# Patient Record
Sex: Female | Born: 1935
Health system: Southern US, Community
[De-identification: ages and names within clinical notes are randomized; demographics above are authoritative.]

## PROBLEM LIST (undated history)

## (undated) DIAGNOSIS — J309 Allergic rhinitis, unspecified: Secondary | ICD-10-CM

## (undated) DIAGNOSIS — G4700 Insomnia, unspecified: Secondary | ICD-10-CM

## (undated) DIAGNOSIS — I447 Left bundle-branch block, unspecified: Secondary | ICD-10-CM

## (undated) DIAGNOSIS — E785 Hyperlipidemia, unspecified: Secondary | ICD-10-CM

## (undated) DIAGNOSIS — M8580 Other specified disorders of bone density and structure, unspecified site: Secondary | ICD-10-CM

## (undated) HISTORY — DX: Other specified disorders of bone density and structure, unspecified site: M85.80

## (undated) HISTORY — DX: Left bundle-branch block, unspecified: I44.7

## (undated) HISTORY — DX: Insomnia, unspecified: G47.00

## (undated) HISTORY — DX: Hyperlipidemia, unspecified: E78.5

## (undated) HISTORY — DX: Allergic rhinitis, unspecified: J30.9

---

## 2000-10-30 ENCOUNTER — Other Ambulatory Visit: Admission: RE | Admit: 2000-10-30 | Discharge: 2000-10-30 | Payer: Self-pay | Admitting: Gynecology

## 2001-06-11 ENCOUNTER — Other Ambulatory Visit: Admission: RE | Admit: 2001-06-11 | Discharge: 2001-06-11 | Payer: Self-pay | Admitting: Gynecology

## 2001-06-12 ENCOUNTER — Ambulatory Visit (HOSPITAL_COMMUNITY): Admission: RE | Admit: 2001-06-12 | Discharge: 2001-06-12 | Payer: Self-pay | Admitting: Family Medicine

## 2001-06-12 ENCOUNTER — Encounter: Payer: Self-pay | Admitting: Family Medicine

## 2002-06-30 ENCOUNTER — Other Ambulatory Visit: Admission: RE | Admit: 2002-06-30 | Discharge: 2002-06-30 | Payer: Self-pay | Admitting: Obstetrics and Gynecology

## 2003-09-21 ENCOUNTER — Other Ambulatory Visit: Admission: RE | Admit: 2003-09-21 | Discharge: 2003-09-21 | Payer: Self-pay | Admitting: Obstetrics and Gynecology

## 2003-09-24 ENCOUNTER — Ambulatory Visit (HOSPITAL_COMMUNITY): Admission: RE | Admit: 2003-09-24 | Discharge: 2003-09-24 | Payer: Self-pay | Admitting: Internal Medicine

## 2004-04-20 ENCOUNTER — Ambulatory Visit (HOSPITAL_COMMUNITY): Admission: RE | Admit: 2004-04-20 | Discharge: 2004-04-20 | Payer: Self-pay | Admitting: Obstetrics and Gynecology

## 2004-10-17 ENCOUNTER — Other Ambulatory Visit: Admission: RE | Admit: 2004-10-17 | Discharge: 2004-10-17 | Payer: Self-pay | Admitting: Obstetrics and Gynecology

## 2004-12-08 ENCOUNTER — Other Ambulatory Visit: Admission: RE | Admit: 2004-12-08 | Discharge: 2004-12-08 | Payer: Self-pay | Admitting: Obstetrics and Gynecology

## 2011-08-17 DIAGNOSIS — Z124 Encounter for screening for malignant neoplasm of cervix: Secondary | ICD-10-CM | POA: Diagnosis not present

## 2011-08-17 DIAGNOSIS — N951 Menopausal and female climacteric states: Secondary | ICD-10-CM | POA: Diagnosis not present

## 2011-08-17 DIAGNOSIS — Z1231 Encounter for screening mammogram for malignant neoplasm of breast: Secondary | ICD-10-CM | POA: Diagnosis not present

## 2011-08-17 DIAGNOSIS — M810 Age-related osteoporosis without current pathological fracture: Secondary | ICD-10-CM | POA: Diagnosis not present

## 2011-08-17 DIAGNOSIS — Z130 Encounter for screening for diseases of the blood and blood-forming organs and certain disorders involving the immune mechanism: Secondary | ICD-10-CM | POA: Diagnosis not present

## 2011-10-27 DIAGNOSIS — Z79899 Other long term (current) drug therapy: Secondary | ICD-10-CM | POA: Diagnosis not present

## 2011-10-27 DIAGNOSIS — E782 Mixed hyperlipidemia: Secondary | ICD-10-CM | POA: Diagnosis not present

## 2011-10-31 DIAGNOSIS — E785 Hyperlipidemia, unspecified: Secondary | ICD-10-CM | POA: Diagnosis not present

## 2012-01-01 DIAGNOSIS — J209 Acute bronchitis, unspecified: Secondary | ICD-10-CM | POA: Diagnosis not present

## 2012-01-18 DIAGNOSIS — J42 Unspecified chronic bronchitis: Secondary | ICD-10-CM | POA: Diagnosis not present

## 2012-05-10 DIAGNOSIS — E785 Hyperlipidemia, unspecified: Secondary | ICD-10-CM | POA: Diagnosis not present

## 2012-05-10 DIAGNOSIS — Z79899 Other long term (current) drug therapy: Secondary | ICD-10-CM | POA: Diagnosis not present

## 2012-05-16 DIAGNOSIS — Z23 Encounter for immunization: Secondary | ICD-10-CM | POA: Diagnosis not present

## 2012-05-16 DIAGNOSIS — E785 Hyperlipidemia, unspecified: Secondary | ICD-10-CM | POA: Diagnosis not present

## 2012-05-27 DIAGNOSIS — Z961 Presence of intraocular lens: Secondary | ICD-10-CM | POA: Diagnosis not present

## 2012-07-05 DIAGNOSIS — J019 Acute sinusitis, unspecified: Secondary | ICD-10-CM | POA: Diagnosis not present

## 2012-07-05 DIAGNOSIS — J069 Acute upper respiratory infection, unspecified: Secondary | ICD-10-CM | POA: Diagnosis not present

## 2012-10-04 DIAGNOSIS — Z1231 Encounter for screening mammogram for malignant neoplasm of breast: Secondary | ICD-10-CM | POA: Diagnosis not present

## 2012-10-04 DIAGNOSIS — Z130 Encounter for screening for diseases of the blood and blood-forming organs and certain disorders involving the immune mechanism: Secondary | ICD-10-CM | POA: Diagnosis not present

## 2012-10-04 DIAGNOSIS — Z01419 Encounter for gynecological examination (general) (routine) without abnormal findings: Secondary | ICD-10-CM | POA: Diagnosis not present

## 2012-10-15 ENCOUNTER — Other Ambulatory Visit: Payer: Self-pay | Admitting: *Deleted

## 2012-10-15 MED ORDER — SIMVASTATIN 20 MG PO TABS: 20.0000 mg | ORAL_TABLET | Freq: Every evening | ORAL | Status: DC

## 2012-11-07 ENCOUNTER — Telehealth: Payer: Self-pay | Admitting: Family Medicine

## 2012-11-07 DIAGNOSIS — E785 Hyperlipidemia, unspecified: Secondary | ICD-10-CM

## 2012-11-07 DIAGNOSIS — Z79899 Other long term (current) drug therapy: Secondary | ICD-10-CM

## 2012-11-07 NOTE — Telephone Encounter (Signed)
Pt needs blood work for appt on 4/28 thanks

## 2012-11-07 NOTE — Telephone Encounter (Signed)
Blood work papers printed and left up front for patient pick up. 

## 2012-11-07 NOTE — Telephone Encounter (Signed)
Lip liv 

## 2012-11-08 ENCOUNTER — Encounter: Payer: Self-pay | Admitting: *Deleted

## 2012-11-12 DIAGNOSIS — Z79899 Other long term (current) drug therapy: Secondary | ICD-10-CM | POA: Diagnosis not present

## 2012-11-12 DIAGNOSIS — E7800 Pure hypercholesterolemia, unspecified: Secondary | ICD-10-CM | POA: Diagnosis not present

## 2012-11-12 LAB — HEPATIC FUNCTION PANEL
ALT: 11 U/L (ref 0–35)
AST: 17 U/L (ref 0–37)
Albumin: 4.2 g/dL (ref 3.5–5.2)
Alkaline Phosphatase: 48 U/L (ref 39–117)
Bilirubin, Direct: 0.1 mg/dL (ref 0.0–0.3)
Indirect Bilirubin: 0.3 mg/dL (ref 0.0–0.9)
Total Bilirubin: 0.4 mg/dL (ref 0.3–1.2)
Total Protein: 6.9 g/dL (ref 6.0–8.3)

## 2012-11-12 LAB — LIPID PANEL
Cholesterol: 178 mg/dL (ref 0–200)
HDL: 65 mg/dL (ref >39)
LDL Cholesterol: 88 mg/dL (ref 0–99)
Total CHOL/HDL Ratio: 2.7 Ratio
Triglycerides: 125 mg/dL (ref <150)
VLDL: 25 mg/dL (ref 0–40)

## 2012-11-18 ENCOUNTER — Ambulatory Visit: Payer: Self-pay | Admitting: Family Medicine

## 2012-11-20 ENCOUNTER — Ambulatory Visit (INDEPENDENT_AMBULATORY_CARE_PROVIDER_SITE_OTHER): Payer: Medicare Other | Admitting: Family Medicine

## 2012-11-20 ENCOUNTER — Encounter: Payer: Self-pay | Admitting: Family Medicine

## 2012-11-20 VITALS — BP 132/77 | HR 80 | Wt 161.0 lb

## 2012-11-20 DIAGNOSIS — E785 Hyperlipidemia, unspecified: Secondary | ICD-10-CM | POA: Diagnosis not present

## 2012-11-20 MED ORDER — SIMVASTATIN 20 MG PO TABS: ORAL_TABLET | ORAL | Status: DC

## 2012-11-20 NOTE — Progress Notes (Signed)
  Subjective:    Patient ID: Marisa Livingston, female    DOB: 1936/01/31, 77 y.o.   MRN: [id number]  HPI  Not the best with diet or exercise. Family stress, not as much as she hopes. t-mill twice per week.  on zyrtec for allergies Review of Systems no chest pain no shortness of breath no major change in weight no abdominal pain. Review systems otherwise negative    Objective:   Physical Exam Alert no acute distress. HEENT normal. Vitals review. Lungs clear. Heart regular in rhythm.   Results for orders placed in visit on 11/07/12  LIPID PANEL      Result Value Range   Cholesterol 178  0 - 200 mg/dL   Triglycerides 782  <956 mg/dL   HDL 65  >21 mg/dL   Total CHOL/HDL Ratio 2.7     VLDL 25  0 - 40 mg/dL   LDL Cholesterol 88  0 - 99 mg/dL  HEPATIC FUNCTION PANEL      Result Value Range   Total Bilirubin 0.4  0.3 - 1.2 mg/dL   Bilirubin, Direct 0.1  0.0 - 0.3 mg/dL   Indirect Bilirubin 0.3  0.0 - 0.9 mg/dL   Alkaline Phosphatase 48  39 - 117 U/L   AST 17  0 - 37 U/L   ALT 11  0 - 35 U/L   Total Protein 6.9  6.0 - 8.3 g/dL   Albumin 4.2  3.5 - 5.2 g/dL       Assessment & Plan:  Impression hyperlipidemia good control. Plan maintain same medicine. Diet exercise discussed in encourage. Check every 6 months. WSL

## 2012-11-20 NOTE — Progress Notes (Signed)
RX called in on voicemail 

## 2013-05-05 ENCOUNTER — Telehealth: Payer: Self-pay | Admitting: Family Medicine

## 2013-05-05 DIAGNOSIS — E782 Mixed hyperlipidemia: Secondary | ICD-10-CM

## 2013-05-05 DIAGNOSIS — Z79899 Other long term (current) drug therapy: Secondary | ICD-10-CM

## 2013-05-05 NOTE — Telephone Encounter (Signed)
Patient needs Bw paperwork

## 2013-05-05 NOTE — Telephone Encounter (Signed)
Blood work ordered in system. Patient notified to report to [facility] to have blood drawn.

## 2013-05-05 NOTE — Telephone Encounter (Signed)
Lip liv m7 

## 2013-05-14 ENCOUNTER — Ambulatory Visit (INDEPENDENT_AMBULATORY_CARE_PROVIDER_SITE_OTHER): Payer: Medicare Other

## 2013-05-14 DIAGNOSIS — Z23 Encounter for immunization: Secondary | ICD-10-CM | POA: Diagnosis not present

## 2013-05-20 DIAGNOSIS — E782 Mixed hyperlipidemia: Secondary | ICD-10-CM | POA: Diagnosis not present

## 2013-05-20 DIAGNOSIS — Z79899 Other long term (current) drug therapy: Secondary | ICD-10-CM | POA: Diagnosis not present

## 2013-05-20 LAB — HEPATIC FUNCTION PANEL
ALT: 11 U/L (ref 0–35)
AST: 17 U/L (ref 0–37)
Albumin: 4.2 g/dL (ref 3.5–5.2)
Alkaline Phosphatase: 52 U/L (ref 39–117)
Bilirubin, Direct: 0.1 mg/dL (ref 0.0–0.3)
Indirect Bilirubin: 0.2 mg/dL (ref 0.0–0.9)
Total Bilirubin: 0.3 mg/dL (ref 0.3–1.2)
Total Protein: 7 g/dL (ref 6.0–8.3)

## 2013-05-20 LAB — BASIC METABOLIC PANEL
BUN: 16 mg/dL (ref 6–23)
CO2: 29 mEq/L (ref 19–32)
Calcium: 9.9 mg/dL (ref 8.4–10.5)
Chloride: 105 mEq/L (ref 96–112)
Creat: 0.89 mg/dL (ref 0.50–1.10)
Glucose, Bld: 90 mg/dL (ref 70–99)
Potassium: 5 mEq/L (ref 3.5–5.3)
Sodium: 141 mEq/L (ref 135–145)

## 2013-05-20 LAB — LIPID PANEL
Cholesterol: 158 mg/dL (ref 0–200)
HDL: 58 mg/dL (ref >39)
LDL Cholesterol: 78 mg/dL (ref 0–99)
Total CHOL/HDL Ratio: 2.7 Ratio
Triglycerides: 112 mg/dL (ref <150)
VLDL: 22 mg/dL (ref 0–40)

## 2013-05-21 ENCOUNTER — Ambulatory Visit: Payer: Medicare Other | Admitting: Family Medicine

## 2013-05-26 ENCOUNTER — Ambulatory Visit (INDEPENDENT_AMBULATORY_CARE_PROVIDER_SITE_OTHER): Payer: Medicare Other | Admitting: Family Medicine

## 2013-05-26 ENCOUNTER — Encounter: Payer: Self-pay | Admitting: Family Medicine

## 2013-05-26 VITALS — BP 140/90 | Ht 62.0 in | Wt 161.4 lb

## 2013-05-26 DIAGNOSIS — Z23 Encounter for immunization: Secondary | ICD-10-CM

## 2013-05-26 NOTE — Progress Notes (Signed)
  Subjective:    Patient ID: Marisa Livingston, female    DOB: [birth year], 77 y.o.   MRN: [id number]  HPI Patient is here today to go over BW.   Not exercising as much, trying to watch diet  Compliant ewith meds  Slight runny nose and cough lately  No concerns.  Results for orders placed in visit on 05/05/13  LIPID PANEL      Result Value Range   Cholesterol 158  0 - 200 mg/dL   Triglycerides 308  <657 mg/dL   HDL 58  >84 mg/dL   Total CHOL/HDL Ratio 2.7     VLDL 22  0 - 40 mg/dL   LDL Cholesterol 78  0 - 99 mg/dL  HEPATIC FUNCTION PANEL      Result Value Range   Total Bilirubin 0.3  0.3 - 1.2 mg/dL   Bilirubin, Direct 0.1  0.0 - 0.3 mg/dL   Indirect Bilirubin 0.2  0.0 - 0.9 mg/dL   Alkaline Phosphatase 52  39 - 117 U/L   AST 17  0 - 37 U/L   ALT 11  0 - 35 U/L   Total Protein 7.0  6.0 - 8.3 g/dL   Albumin 4.2  3.5 - 5.2 g/dL  BASIC METABOLIC PANEL      Result Value Range   Sodium 141  135 - 145 mEq/L   Potassium 5.0  3.5 - 5.3 mEq/L   Chloride 105  96 - 112 mEq/L   CO2 29  19 - 32 mEq/L   Glucose, Bld 90  70 - 99 mg/dL   BUN 16  6 - 23 mg/dL   Creat 6.96  2.95 - 2.84 mg/dL   Calcium 9.9  8.4 - 13.2 mg/dL      Review of Systems No headache no chest pain no back pain decent appetite no change in bowel habits ROS otherwise negative    Objective:   Physical Exam  Alert no apparent distress. Lungs clear. Heart regular in rhythm. HEENT normal. Ankles without edema.      Assessment & Plan:  Impression hyperlipidemia good control. Plan followup in 6 months. Maintain same medication. Pneumonia shot today. Diet exercise discussed. WSL

## 2013-08-07 DIAGNOSIS — Z961 Presence of intraocular lens: Secondary | ICD-10-CM | POA: Diagnosis not present

## 2013-12-17 DIAGNOSIS — Z124 Encounter for screening for malignant neoplasm of cervix: Secondary | ICD-10-CM | POA: Diagnosis not present

## 2013-12-17 DIAGNOSIS — Z1231 Encounter for screening mammogram for malignant neoplasm of breast: Secondary | ICD-10-CM | POA: Diagnosis not present

## 2014-01-02 ENCOUNTER — Other Ambulatory Visit: Payer: Self-pay | Admitting: Family Medicine

## 2014-01-02 ENCOUNTER — Telehealth: Payer: Self-pay | Admitting: Family Medicine

## 2014-01-02 DIAGNOSIS — Z79899 Other long term (current) drug therapy: Secondary | ICD-10-CM

## 2014-01-02 DIAGNOSIS — E782 Mixed hyperlipidemia: Secondary | ICD-10-CM

## 2014-01-02 NOTE — Telephone Encounter (Signed)
Patient needs order for blood work. °

## 2014-01-02 NOTE — Telephone Encounter (Signed)
Patient had Lipid, Liver and Met 7 -10/14

## 2014-01-05 NOTE — Telephone Encounter (Signed)
Lip liv 

## 2014-01-05 NOTE — Telephone Encounter (Signed)
Bloodwork ordered. Left message on voicemail notifying patient.

## 2014-01-12 DIAGNOSIS — E782 Mixed hyperlipidemia: Secondary | ICD-10-CM | POA: Diagnosis not present

## 2014-01-12 DIAGNOSIS — Z79899 Other long term (current) drug therapy: Secondary | ICD-10-CM | POA: Diagnosis not present

## 2014-01-12 LAB — LIPID PANEL
Cholesterol: 183 mg/dL (ref 0–200)
HDL: 52 mg/dL (ref >39)
LDL Cholesterol: 95 mg/dL (ref 0–99)
Total CHOL/HDL Ratio: 3.5 Ratio
Triglycerides: 182 mg/dL — ABNORMAL HIGH (ref <150)
VLDL: 36 mg/dL (ref 0–40)

## 2014-01-12 LAB — HEPATIC FUNCTION PANEL
ALK PHOS: 46 U/L (ref 39–117)
ALT: 11 U/L (ref 0–35)
AST: 14 U/L (ref 0–37)
Albumin: 4.2 g/dL (ref 3.5–5.2)
BILIRUBIN DIRECT: 0.1 mg/dL (ref 0.0–0.3)
BILIRUBIN TOTAL: 0.3 mg/dL (ref 0.2–1.2)
Indirect Bilirubin: 0.2 mg/dL (ref 0.2–1.2)
Total Protein: 7.1 g/dL (ref 6.0–8.3)

## 2014-01-16 ENCOUNTER — Ambulatory Visit (INDEPENDENT_AMBULATORY_CARE_PROVIDER_SITE_OTHER): Payer: Medicare Other | Admitting: Family Medicine

## 2014-01-16 ENCOUNTER — Encounter: Payer: Self-pay | Admitting: Family Medicine

## 2014-01-16 VITALS — BP 130/80 | Ht 62.0 in | Wt 163.1 lb

## 2014-01-16 DIAGNOSIS — E785 Hyperlipidemia, unspecified: Secondary | ICD-10-CM | POA: Diagnosis not present

## 2014-01-16 MED ORDER — SIMVASTATIN 20 MG PO TABS: ORAL_TABLET | ORAL | Status: DC

## 2014-01-16 NOTE — Progress Notes (Signed)
   Subjective:    Patient ID: Marisa Livingston, female    DOB: 1936/04/21, 78 y.o.   MRN: [id number]  Hyperlipidemia This is a chronic problem. The current episode started more than 1 year ago. The problem is controlled. There are no known factors aggravating her hyperlipidemia. Current antihyperlipidemic treatment includes statins. The current treatment provides significant improvement of lipids. There are no compliance problems.  There are no known risk factors for coronary artery disease.  Patient had bloodwork completed on 01/12/14 and results are in the system.   Patient states that she has no concerns at this time.    Results for orders placed in visit on 01/02/14  LIPID PANEL      Result Value Ref Range   Cholesterol 183  0 - 200 mg/dL   Triglycerides 182 (*) <150 mg/dL   HDL 52  >39 mg/dL   Total CHOL/HDL Ratio 3.5     VLDL 36  0 - 40 mg/dL   LDL Cholesterol 95  0 - 99 mg/dL  HEPATIC FUNCTION PANEL      Result Value Ref Range   Total Bilirubin 0.3  0.2 - 1.2 mg/dL   Bilirubin, Direct 0.1  0.0 - 0.3 mg/dL   Indirect Bilirubin 0.2  0.2 - 1.2 mg/dL   Alkaline Phosphatase 46  39 - 117 U/L   AST 14  0 - 37 U/L   ALT 11  0 - 35 U/L   Total Protein 7.1  6.0 - 8.3 g/dL   Albumin 4.2  3.5 - 5.2 g/dL   Walking on [place] three times per wk, now has dropped off  Diet  A bit more on the run with family stresses Review of Systems No headache no chest pain no back pain no abdominal pain no change in bowel habits    Objective:   Physical Exam Alert vital signs stable. HEENT normal. Lungs clear heart regular in rhythm.       Assessment & Plan:  Impression 1 hyperlipidemia discussed triglycerides up somewhat. Plan work harder on diet and also sugars. Exercise encourage. Maintain same meds. Recheck in 6 months. WSL

## 2014-04-28 ENCOUNTER — Ambulatory Visit (INDEPENDENT_AMBULATORY_CARE_PROVIDER_SITE_OTHER): Payer: Medicare Other | Admitting: *Deleted

## 2014-04-28 DIAGNOSIS — Z23 Encounter for immunization: Secondary | ICD-10-CM | POA: Diagnosis not present

## 2014-07-22 ENCOUNTER — Telehealth: Payer: Self-pay

## 2014-07-22 DIAGNOSIS — Z79899 Other long term (current) drug therapy: Secondary | ICD-10-CM

## 2014-07-22 DIAGNOSIS — E785 Hyperlipidemia, unspecified: Secondary | ICD-10-CM

## 2014-07-22 NOTE — Telephone Encounter (Signed)
Pt is needing labs sent over for appt. On 07/29/13.  Lipid panel and hepatic panel on 01/12/14

## 2014-07-22 NOTE — Telephone Encounter (Signed)
Notified patient's husband Kyung Rudd) bloodwork ordered and can report to the lab.

## 2014-07-22 NOTE — Telephone Encounter (Signed)
Lip liv met 7 

## 2014-07-25 DIAGNOSIS — Z79899 Other long term (current) drug therapy: Secondary | ICD-10-CM | POA: Diagnosis not present

## 2014-07-25 DIAGNOSIS — E785 Hyperlipidemia, unspecified: Secondary | ICD-10-CM | POA: Diagnosis not present

## 2014-07-26 LAB — HEPATIC FUNCTION PANEL
ALBUMIN: 3.9 g/dL (ref 3.5–5.2)
ALT: 10 U/L (ref 0–35)
AST: 16 U/L (ref 0–37)
Alkaline Phosphatase: 44 U/L (ref 39–117)
Bilirubin, Direct: 0.1 mg/dL (ref 0.0–0.3)
Indirect Bilirubin: 0.3 mg/dL (ref 0.2–1.2)
Total Bilirubin: 0.4 mg/dL (ref 0.2–1.2)
Total Protein: 7.1 g/dL (ref 6.0–8.3)

## 2014-07-26 LAB — LIPID PANEL
Cholesterol: 169 mg/dL (ref 0–200)
HDL: 54 mg/dL (ref >39)
LDL Cholesterol: 94 mg/dL (ref 0–99)
Total CHOL/HDL Ratio: 3.1 Ratio
Triglycerides: 103 mg/dL (ref <150)
VLDL: 21 mg/dL (ref 0–40)

## 2014-07-26 LAB — BASIC METABOLIC PANEL
BUN: 25 mg/dL — AB (ref 6–23)
CO2: 26 mEq/L (ref 19–32)
CREATININE: 0.84 mg/dL (ref 0.50–1.10)
Calcium: 9.4 mg/dL (ref 8.4–10.5)
Chloride: 106 mEq/L (ref 96–112)
Glucose, Bld: 91 mg/dL (ref 70–99)
Potassium: 4.8 mEq/L (ref 3.5–5.3)
Sodium: 141 mEq/L (ref 135–145)

## 2014-07-29 ENCOUNTER — Ambulatory Visit (INDEPENDENT_AMBULATORY_CARE_PROVIDER_SITE_OTHER): Payer: Medicare Other | Admitting: Family Medicine

## 2014-07-29 ENCOUNTER — Encounter: Payer: Self-pay | Admitting: Family Medicine

## 2014-07-29 VITALS — BP 122/84 | Ht 62.0 in | Wt 165.2 lb

## 2014-07-29 DIAGNOSIS — E785 Hyperlipidemia, unspecified: Secondary | ICD-10-CM

## 2014-07-29 MED ORDER — SIMVASTATIN 20 MG PO TABS: ORAL_TABLET | ORAL | Status: DC

## 2014-07-29 NOTE — Progress Notes (Signed)
   Subjective:    Patient ID: Marisa Livingston, female    DOB: [birth year], 79 y.o.   MRN: [id number]  Hyperlipidemia This is a chronic problem. The current episode started more than 1 year ago. Current antihyperlipidemic treatment includes statins. There are no compliance problems.  Risk factors for coronary artery disease include dyslipidemia.    Results for orders placed or performed in visit on 07/22/14  Lipid panel  Result Value Ref Range   Cholesterol 169 0 - 200 mg/dL   Triglycerides 103 <150 mg/dL   HDL 54 >39 mg/dL   Total CHOL/HDL Ratio 3.1 Ratio   VLDL 21 0 - 40 mg/dL   LDL Cholesterol 94 0 - 99 mg/dL  Hepatic function panel  Result Value Ref Range   Total Bilirubin 0.4 0.2 - 1.2 mg/dL   Bilirubin, Direct 0.1 0.0 - 0.3 mg/dL   Indirect Bilirubin 0.3 0.2 - 1.2 mg/dL   Alkaline Phosphatase 44 39 - 117 U/L   AST 16 0 - 37 U/L   ALT 10 0 - 35 U/L   Total Protein 7.1 6.0 - 8.3 g/dL   Albumin 3.9 3.5 - 5.2 g/dL  Basic metabolic panel  Result Value Ref Range   Sodium 141 135 - 145 mEq/L   Potassium 4.8 3.5 - 5.3 mEq/L   Chloride 106 96 - 112 mEq/L   CO2 26 19 - 32 mEq/L   Glucose, Bld 91 70 - 99 mg/dL   BUN 25 (H) 6 - 23 mg/dL   Creat 0.84 0.50 - 1.10 mg/dL   Calcium 9.4 8.4 - 10.5 mg/dL    Taking faithfully with meds,    Diet not the best at christmas  Cancer hx in one of the fam members     Review of Systems No headache no chest pain no back pain abdominal pain no change in bowel habits ROS otherwise negative    Objective:   Physical Exam Alert pleasant no acute distress. Vitals stable. HEENT normal. Lungs clear. Heart regular rate and rhythm.       Assessment & Plan:  Impression #1 hyperlipidemia good control discussed plan diet exercise discussed. Maintain same medications. Recheck every 6 months. WSL

## 2014-08-10 DIAGNOSIS — Z961 Presence of intraocular lens: Secondary | ICD-10-CM | POA: Diagnosis not present

## 2014-10-05 DIAGNOSIS — D1801 Hemangioma of skin and subcutaneous tissue: Secondary | ICD-10-CM | POA: Diagnosis not present

## 2014-10-05 DIAGNOSIS — L821 Other seborrheic keratosis: Secondary | ICD-10-CM | POA: Diagnosis not present

## 2014-10-05 DIAGNOSIS — D235 Other benign neoplasm of skin of trunk: Secondary | ICD-10-CM | POA: Diagnosis not present

## 2014-10-05 DIAGNOSIS — L570 Actinic keratosis: Secondary | ICD-10-CM | POA: Diagnosis not present

## 2014-10-05 DIAGNOSIS — L814 Other melanin hyperpigmentation: Secondary | ICD-10-CM | POA: Diagnosis not present

## 2014-12-23 ENCOUNTER — Encounter: Payer: Medicare Other | Admitting: Nurse Practitioner

## 2014-12-24 ENCOUNTER — Encounter: Payer: Self-pay | Admitting: Nurse Practitioner

## 2014-12-24 ENCOUNTER — Ambulatory Visit (INDEPENDENT_AMBULATORY_CARE_PROVIDER_SITE_OTHER): Payer: Medicare Other | Admitting: Nurse Practitioner

## 2014-12-24 VITALS — BP 132/78 | Ht 62.0 in | Wt 163.4 lb

## 2014-12-24 DIAGNOSIS — Z0000 Encounter for general adult medical examination without abnormal findings: Secondary | ICD-10-CM

## 2014-12-24 DIAGNOSIS — Z780 Asymptomatic menopausal state: Secondary | ICD-10-CM | POA: Diagnosis not present

## 2014-12-24 DIAGNOSIS — Z1231 Encounter for screening mammogram for malignant neoplasm of breast: Secondary | ICD-10-CM | POA: Diagnosis not present

## 2014-12-24 DIAGNOSIS — Z13820 Encounter for screening for osteoporosis: Secondary | ICD-10-CM | POA: Diagnosis not present

## 2014-12-24 DIAGNOSIS — Z23 Encounter for immunization: Secondary | ICD-10-CM | POA: Diagnosis not present

## 2014-12-24 MED ORDER — PNEUMOCOCCAL 13-VAL CONJ VACC IM SUSP: 0.5000 mL | Freq: Once | INTRAMUSCULAR | Status: DC

## 2014-12-24 NOTE — Patient Instructions (Signed)
1200 mg calcium per day 1000 units vitamin D per day

## 2014-12-24 NOTE — Progress Notes (Signed)
   Subjective:    Patient ID: Marisa Livingston, female    DOB: August 31, 1935, 79 y.o.   MRN: [id number]  HPI presents for her wellness exam. No vaginal bleeding or pelvic pain. Same sexual partner. Not sexually active. Regular vision and dental exams. Very active lifestyle. Overall healthy diet. No family history of breast     Review of Systems  Constitutional: Negative for activity change, appetite change and fatigue.  HENT: Negative for dental problem, ear pain, sinus pressure and sore throat.   Respiratory: Negative for cough, chest tightness, shortness of breath and wheezing.   Cardiovascular: Negative for chest pain.  Gastrointestinal: Negative for nausea, vomiting, abdominal pain, diarrhea, constipation and abdominal distention.  Genitourinary: Negative for dysuria, urgency, frequency, vaginal bleeding, vaginal discharge, enuresis, difficulty urinating, genital sores and pelvic pain.       Objective:   Physical Exam  Constitutional: She is oriented to person, place, and time. She appears well-developed. No distress.  HENT:  Right Ear: External ear normal.  Left Ear: External ear normal.  Mouth/Throat: Oropharynx is clear and moist.  Neck: Normal range of motion. Neck supple. No tracheal deviation present. No thyromegaly present.  Cardiovascular: Normal rate, regular rhythm and normal heart sounds.  Exam reveals no gallop.   No murmur heard. Pulmonary/Chest: Effort normal and breath sounds normal.  Abdominal: Soft. She exhibits no distension. There is no tenderness.  Genitourinary: Vagina normal and uterus normal. No vaginal discharge found.  External GU: pale; no rash or lesions. Limited vaginal exam due to discomfort related to menopause. No discharge. Bimanual exam: no tenderness or obvious masses. Rectal exam: no masses; no stool for hemoccult.   Musculoskeletal: She exhibits no edema.  Lymphadenopathy:    She has no cervical adenopathy.  Neurological: She is alert and  oriented to person, place, and time.  Skin: Skin is warm and dry. No rash noted.  Psychiatric: She has a normal mood and affect. Her behavior is normal.  Vitals reviewed. Breast exam: no masses; axille no adenopathy.        Assessment & Plan:  Routine general medical examination at a health care facility - Plan: MM SCREENING BREAST TOMO BILATERAL, CANCELED: MM DIGITAL SCREENING BILATERAL  Need for influenza vaccination - Plan: DISCONTINUED: pneumococcal 13-valent conjugate vaccine (PREVNAR 13) injection 0.5 mL  Need for vaccination - Plan: CANCELED: Varicella-zoster vaccine subcutaneous  Screening for osteoporosis  Visit for screening mammogram - Plan: MM SCREENING BREAST TOMO BILATERAL, CANCELED: MM DIGITAL SCREENING BILATERAL  Post-menopausal - Plan: DG Bone Density  Patient plans to schedule her colonoscopy with Dr. Laural Golden. Also given Rx for Zostavax. Recommend daily vitamin D and calcium. Return in about 1 year (around 12/24/2015) for physical. Routine follow up with Dr. Richardson Landry.

## 2014-12-29 ENCOUNTER — Ambulatory Visit (HOSPITAL_COMMUNITY)
Admission: RE | Admit: 2014-12-29 | Discharge: 2014-12-29 | Disposition: A | Discharge status: Home / Self Care | Payer: Medicare Other | Source: Ambulatory Visit | Attending: Nurse Practitioner | Admitting: Nurse Practitioner

## 2014-12-29 DIAGNOSIS — Z780 Asymptomatic menopausal state: Secondary | ICD-10-CM | POA: Diagnosis not present

## 2014-12-29 DIAGNOSIS — M898X8 Other specified disorders of bone, other site: Secondary | ICD-10-CM | POA: Diagnosis not present

## 2015-01-06 ENCOUNTER — Ambulatory Visit (HOSPITAL_COMMUNITY)
Admission: RE | Admit: 2015-01-06 | Discharge: 2015-01-06 | Disposition: A | Discharge status: Home / Self Care | Payer: Medicare Other | Source: Ambulatory Visit | Attending: Nurse Practitioner | Admitting: Nurse Practitioner

## 2015-01-06 DIAGNOSIS — Z1231 Encounter for screening mammogram for malignant neoplasm of breast: Secondary | ICD-10-CM | POA: Insufficient documentation

## 2015-03-02 ENCOUNTER — Telehealth: Payer: Self-pay | Admitting: Family Medicine

## 2015-03-02 ENCOUNTER — Other Ambulatory Visit: Payer: Self-pay | Admitting: *Deleted

## 2015-03-02 DIAGNOSIS — Z79899 Other long term (current) drug therapy: Secondary | ICD-10-CM

## 2015-03-02 DIAGNOSIS — E785 Hyperlipidemia, unspecified: Secondary | ICD-10-CM

## 2015-03-02 NOTE — Telephone Encounter (Signed)
bw orders ready. Pt notified. 

## 2015-03-02 NOTE — Telephone Encounter (Signed)
Lipid, liver

## 2015-03-02 NOTE — Telephone Encounter (Signed)
Pt would like to get her bw orders for an appt 9/2  Last labs  07/25/14  Lip, hep, bmp   Aware of [facility]

## 2015-03-19 DIAGNOSIS — E785 Hyperlipidemia, unspecified: Secondary | ICD-10-CM | POA: Diagnosis not present

## 2015-03-19 DIAGNOSIS — Z79899 Other long term (current) drug therapy: Secondary | ICD-10-CM | POA: Diagnosis not present

## 2015-03-20 LAB — HEPATIC FUNCTION PANEL
ALBUMIN: 4.3 g/dL (ref 3.5–4.8)
ALT: 10 IU/L (ref 0–32)
AST: 16 IU/L (ref 0–40)
Alkaline Phosphatase: 54 IU/L (ref 39–117)
Bilirubin Total: 0.3 mg/dL (ref 0.0–1.2)
Bilirubin, Direct: 0.11 mg/dL (ref 0.00–0.40)
Total Protein: 6.9 g/dL (ref 6.0–8.5)

## 2015-03-20 LAB — LIPID PANEL
CHOL/HDL RATIO: 3 ratio (ref 0.0–4.4)
Cholesterol, Total: 190 mg/dL (ref 100–199)
HDL: 64 mg/dL (ref >39)
LDL CALC: 94 mg/dL (ref 0–99)
Triglycerides: 161 mg/dL — ABNORMAL HIGH (ref 0–149)
VLDL CHOLESTEROL CAL: 32 mg/dL (ref 5–40)

## 2015-03-26 ENCOUNTER — Ambulatory Visit (INDEPENDENT_AMBULATORY_CARE_PROVIDER_SITE_OTHER): Payer: Medicare Other | Admitting: Family Medicine

## 2015-03-26 ENCOUNTER — Encounter: Payer: Self-pay | Admitting: Family Medicine

## 2015-03-26 VITALS — BP 122/70 | Ht 62.0 in | Wt 163.0 lb

## 2015-03-26 DIAGNOSIS — E785 Hyperlipidemia, unspecified: Secondary | ICD-10-CM | POA: Diagnosis not present

## 2015-03-26 MED ORDER — SIMVASTATIN 20 MG PO TABS: ORAL_TABLET | ORAL | Status: DC

## 2015-03-26 NOTE — Progress Notes (Signed)
   Subjective:    Patient ID: Marisa Livingston, female    DOB: 04-07-1936, 79 y.o.   MRN: [id number]  Hyperlipidemia This is a chronic problem. Treatments tried: simvastatin. There are no compliance problems (walks two days a week, eats healthy).     Results for orders placed or performed in visit on 03/02/15  Lipid panel  Result Value Ref Range   Cholesterol, Total 190 100 - 199 mg/dL   Triglycerides 161 (H) 0 - 149 mg/dL   HDL 64 >39 mg/dL   VLDL Cholesterol Cal 32 5 - 40 mg/dL   LDL Calculated 94 0 - 99 mg/dL   Chol/HDL Ratio 3.0 0.0 - 4.4 ratio units  Hepatic function panel  Result Value Ref Range   Total Protein 6.9 6.0 - 8.5 g/dL   Albumin 4.3 3.5 - 4.8 g/dL   Bilirubin Total 0.3 0.0 - 1.2 mg/dL   Bilirubin, Direct 0.11 0.00 - 0.40 mg/dL   Alkaline Phosphatase 54 39 - 117 IU/L   AST 16 0 - 40 IU/L   ALT 10 0 - 32 IU/L   Pt states eating diet well, ocas goes of,  Freeway drive plan to walk  Pt states no concerns or problems today.   sept   Review of Systems No chest pain no headache no back pain    Objective:   Physical Exam  Alert vitals stable. Blood pressure good on repeat. HEENT normal lungs clear heart regular in rhythm.      Assessment & Plan:  Impression hyperlipidemia good control plan diet exercise discussed. Concerns regarding potential for heart disease discussed WSL recheck in 6 months flu shot recommend this fall

## 2015-04-22 ENCOUNTER — Other Ambulatory Visit (INDEPENDENT_AMBULATORY_CARE_PROVIDER_SITE_OTHER): Payer: Self-pay | Admitting: *Deleted

## 2015-04-22 DIAGNOSIS — Z1211 Encounter for screening for malignant neoplasm of colon: Secondary | ICD-10-CM

## 2015-04-28 ENCOUNTER — Ambulatory Visit (INDEPENDENT_AMBULATORY_CARE_PROVIDER_SITE_OTHER): Payer: Medicare Other | Admitting: *Deleted

## 2015-04-28 DIAGNOSIS — Z23 Encounter for immunization: Secondary | ICD-10-CM

## 2015-05-24 ENCOUNTER — Telehealth (INDEPENDENT_AMBULATORY_CARE_PROVIDER_SITE_OTHER): Payer: Self-pay | Admitting: *Deleted

## 2015-05-24 DIAGNOSIS — Z1211 Encounter for screening for malignant neoplasm of colon: Secondary | ICD-10-CM

## 2015-05-24 NOTE — Telephone Encounter (Signed)
Patient needs trilyte 

## 2015-05-31 MED ORDER — PEG 3350-KCL-NA BICARB-NACL 420 G PO SOLR: 4000.0000 mL | Freq: Once | ORAL | Status: DC

## 2015-06-14 ENCOUNTER — Telehealth (INDEPENDENT_AMBULATORY_CARE_PROVIDER_SITE_OTHER): Payer: Self-pay | Admitting: *Deleted

## 2015-06-14 NOTE — Telephone Encounter (Signed)
Referring MD/PCP: steve luking   Procedure: tcs  Reason/Indication:  screening  Has patient had this procedure before?  Yes, 2005 no  If so, when, by whom and where?    Is there a family history of colon cancer?  no  Who?  What age when diagnosed?    Is patient diabetic?   no      Does patient have prosthetic heart valve or mechanical valve?  no  Do you have a pacemaker?  no  Has patient ever had endocarditis? no  Has patient had joint replacement within last 12 months?  no  Does patient tend to be constipated or take laxatives? no  Does patient have a history of alcohol/drug use?  no  Is patient on Coumadin, Plavix and/or Aspirin? no  Medications: simvstatin 20 mg 1/2 tab daily  Allergies: codeine  Medication Adjustment:   Procedure date & time: 07/07/15 at 1030

## 2015-06-15 NOTE — Telephone Encounter (Signed)
agree

## 2015-07-05 ENCOUNTER — Ambulatory Visit (INDEPENDENT_AMBULATORY_CARE_PROVIDER_SITE_OTHER): Payer: Medicare Other | Admitting: Nurse Practitioner

## 2015-07-05 VITALS — BP 132/70 | Temp 99.7°F | Ht 62.0 in | Wt 161.0 lb

## 2015-07-05 DIAGNOSIS — J069 Acute upper respiratory infection, unspecified: Secondary | ICD-10-CM

## 2015-07-05 DIAGNOSIS — B9689 Other specified bacterial agents as the cause of diseases classified elsewhere: Secondary | ICD-10-CM

## 2015-07-05 MED ORDER — AMOXICILLIN-POT CLAVULANATE 875-125 MG PO TABS: 1.0000 | ORAL_TABLET | Freq: Two times a day (BID) | ORAL | Status: DC

## 2015-07-07 ENCOUNTER — Encounter: Payer: Self-pay | Admitting: Nurse Practitioner

## 2015-07-07 NOTE — Progress Notes (Signed)
Subjective:  Presents for c/o cough and congestion that began 4 days ago. Her husband has also been sick. No fever but chills. Sore throat. Symptoms worse last night. Runny nose. Occasional cough. Yellow mucous. Ear pain improved. No wheezing. No headache. Taking fluids well. Voiding normal limit.  Objective:   BP 132/70 mmHg  Temp(Src) 99.7 F (37.6 C) (Oral)  Ht 5\' 2"  (1.575 m)  Wt 161 lb (73.029 kg)  BMI 29.44 kg/m2 NAD. Alert, oriented. TMs clear effusion, no erythema. Pharynx injected with PND noted. Neck supple with mild soft anterior adenopathy. Lungs clear. Heart regular rate rhythm.  Assessment: Bacterial upper respiratory infection  Plan:  Meds ordered this encounter  Medications  . amoxicillin-clavulanate (AUGMENTIN) 875-125 MG tablet    Sig: Take 1 tablet by mouth 2 (two) times daily.    Dispense:  20 tablet    Refill:  0    Order Specific Question:  Supervising Provider    Answer:  Mikey Kirschner [[id number]]   OTC meds as directed for congestion and cough. Call back if worsens or persists.

## 2015-08-04 ENCOUNTER — Telehealth (INDEPENDENT_AMBULATORY_CARE_PROVIDER_SITE_OTHER): Payer: Self-pay | Admitting: *Deleted

## 2015-08-04 NOTE — Telephone Encounter (Signed)
Referring MD/PCP: steve luking   Procedure: tcs  Reason/Indication:  screening  Has patient had this procedure before?  Yes, 2005  If so, when, by whom and where?    Is there a family history of colon cancer?  no  Who?  What age when diagnosed?    Is patient diabetic?   no      Does patient have prosthetic heart valve or mechanical valve?  no  Do you have a pacemaker?  no  Has patient ever had endocarditis? no  Has patient had joint replacement within last 12 months?  no  Does patient tend to be constipated or take laxatives? no  Does patient have a history of alcohol/drug use?  no  Is patient on Coumadin, Plavix and/or Aspirin? no  Medications: simvastatin 20 mg 1/2 tab daily  Allergies: see epic  Medication Adjustment:   Procedure date & time: 09/02/15 at 830

## 2015-08-05 NOTE — Telephone Encounter (Signed)
agree

## 2015-09-02 ENCOUNTER — Encounter (HOSPITAL_COMMUNITY)
Admission: RE | Disposition: A | Discharge status: Home / Self Care | Payer: Self-pay | Source: Ambulatory Visit | Attending: Internal Medicine

## 2015-09-02 ENCOUNTER — Ambulatory Visit (HOSPITAL_COMMUNITY)
Admission: RE | Admit: 2015-09-02 | Discharge: 2015-09-02 | Disposition: A | Discharge status: Home / Self Care | Payer: Medicare Other | Source: Ambulatory Visit | Attending: Internal Medicine | Admitting: Internal Medicine

## 2015-09-02 ENCOUNTER — Encounter (HOSPITAL_COMMUNITY): Payer: Self-pay

## 2015-09-02 DIAGNOSIS — E785 Hyperlipidemia, unspecified: Secondary | ICD-10-CM | POA: Insufficient documentation

## 2015-09-02 DIAGNOSIS — Z79899 Other long term (current) drug therapy: Secondary | ICD-10-CM | POA: Diagnosis not present

## 2015-09-02 DIAGNOSIS — Z1211 Encounter for screening for malignant neoplasm of colon: Secondary | ICD-10-CM | POA: Insufficient documentation

## 2015-09-02 DIAGNOSIS — D123 Benign neoplasm of transverse colon: Secondary | ICD-10-CM | POA: Diagnosis not present

## 2015-09-02 DIAGNOSIS — K5730 Diverticulosis of large intestine without perforation or abscess without bleeding: Secondary | ICD-10-CM | POA: Diagnosis not present

## 2015-09-02 DIAGNOSIS — K648 Other hemorrhoids: Secondary | ICD-10-CM | POA: Diagnosis not present

## 2015-09-02 DIAGNOSIS — Q438 Other specified congenital malformations of intestine: Secondary | ICD-10-CM

## 2015-09-02 HISTORY — PX: POLYPECTOMY: SHX5525

## 2015-09-02 HISTORY — PX: COLONOSCOPY: SHX5424

## 2015-09-02 SURGERY — COLONOSCOPY
Anesthesia: Moderate Sedation

## 2015-09-02 MED ORDER — MIDAZOLAM HCL 5 MG/5ML IJ SOLN
INTRAMUSCULAR | Status: AC
  Filled 2015-09-02: qty 10

## 2015-09-02 MED ORDER — MEPERIDINE HCL 50 MG/ML IJ SOLN
INTRAMUSCULAR | Status: DC | PRN
  Administered 2015-09-02 (×2): 25 mg via INTRAVENOUS

## 2015-09-02 MED ORDER — MIDAZOLAM HCL 5 MG/5ML IJ SOLN
INTRAMUSCULAR | Status: DC | PRN
  Administered 2015-09-02: 2 mg via INTRAVENOUS
  Administered 2015-09-02 (×5): 1 mg via INTRAVENOUS

## 2015-09-02 MED ORDER — MEPERIDINE HCL 50 MG/ML IJ SOLN
INTRAMUSCULAR | Status: AC
  Filled 2015-09-02: qty 1

## 2015-09-02 MED ORDER — SODIUM CHLORIDE 0.9 % IV SOLN
INTRAVENOUS | Status: DC
  Administered 2015-09-02: 1000 mL via INTRAVENOUS

## 2015-09-02 NOTE — Discharge Instructions (Signed)
No aspirin or NSAIDs for 1 week. Resume usual medications and high fiber diet. No driving for 24 hours. Physician will call with biopsy results.     High-Fiber Diet Fiber, also called dietary fiber, is a type of carbohydrate found in fruits, vegetables, whole grains, and beans. A high-fiber diet can have many health benefits. Your health care provider may recommend a high-fiber diet to help:  Prevent constipation. Fiber can make your bowel movements more regular.  Lower your cholesterol.  Relieve hemorrhoids, uncomplicated diverticulosis, or irritable bowel syndrome.  Prevent overeating as part of a weight-loss plan.  Prevent heart disease, type 2 diabetes, and certain cancers. WHAT IS MY PLAN? The recommended daily intake of fiber includes:  38 grams for men under age 44.  48 grams for men over age 36.  86 grams for women under age 66.  48 grams for women over age 26. You can get the recommended daily intake of dietary fiber by eating a variety of fruits, vegetables, grains, and beans. Your health care provider may also recommend a fiber supplement if it is not possible to get enough fiber through your diet. WHAT DO I NEED TO KNOW ABOUT A HIGH-FIBER DIET?  Fiber supplements have not been widely studied for their effectiveness, so it is better to get fiber through food sources.  Always check the fiber content on thenutrition facts label of any prepackaged food. Look for foods that contain at least 5 grams of fiber per serving.  Ask your dietitian if you have questions about specific foods that are related to your condition, especially if those foods are not listed in the following section.  Increase your daily fiber consumption gradually. Increasing your intake of dietary fiber too quickly may cause bloating, cramping, or gas.  Drink plenty of water. Water helps you to digest fiber. WHAT FOODS CAN I EAT? Grains Whole-grain breads. Multigrain cereal. Oats and oatmeal.  Brown rice. Barley. Bulgur wheat. Prentiss. Bran muffins. Popcorn. Rye wafer crackers. Vegetables Sweet potatoes. Spinach. Kale. Artichokes. Cabbage. Broccoli. Green peas. Carrots. Squash. Fruits Berries. Pears. Apples. Oranges. Avocados. Prunes and raisins. Dried figs. Meats and Other Protein Sources Navy, kidney, pinto, and soy beans. Split peas. Lentils. Nuts and seeds. Dairy Fiber-fortified yogurt. Beverages Fiber-fortified soy milk. Fiber-fortified orange juice. Other Fiber bars. The items listed above may not be a complete list of recommended foods or beverages. Contact your dietitian for more options. WHAT FOODS ARE NOT RECOMMENDED? Grains White bread. Pasta made with refined flour. White rice. Vegetables Fried potatoes. Canned vegetables. Well-cooked vegetables.  Fruits Fruit juice. Cooked, strained fruit. Meats and Other Protein Sources Fatty cuts of meat. Fried Sales executive or fried fish. Dairy Milk. Yogurt. Cream cheese. Sour cream. Beverages Soft drinks. Other Cakes and pastries. Butter and oils. The items listed above may not be a complete list of foods and beverages to avoid. Contact your dietitian for more information. WHAT ARE SOME TIPS FOR INCLUDING HIGH-FIBER FOODS IN MY DIET?  Eat a wide variety of high-fiber foods.  Make sure that half of all grains consumed each day are whole grains.  Replace breads and cereals made from refined flour or white flour with whole-grain breads and cereals.  Replace white rice with brown rice, bulgur wheat, or millet.  Start the day with a breakfast that is high in fiber, such as a cereal that contains at least 5 grams of fiber per serving.  Use beans in place of meat in soups, salads, or pasta.  Eat high-fiber snacks, such  as berries, raw vegetables, nuts, or popcorn.   This information is not intended to replace advice given to you by your health care provider. Make sure you discuss any questions you have with your health care  provider.   Document Released: 07/10/2005 Document Revised: 07/31/2014 Document Reviewed: 12/23/2013 Elsevier Interactive Patient Education 2016 [employer]. Colonoscopy, Care After These instructions give you information on caring for yourself after your procedure. Your doctor may also give you more specific instructions. Call your doctor if you have any problems or questions after your procedure. HOME CARE  Do not drive for 24 hours.  Do not sign important papers or use machinery for 24 hours.  You may shower.  You may go back to your usual activities, but go slower for the first 24 hours.  Take rest breaks often during the first 24 hours.  Walk around or use warm packs on your belly (abdomen) if you have belly cramping or gas.  Drink enough fluids to keep your pee (urine) clear or pale yellow.  Resume your normal diet. Avoid heavy or fried foods.  Avoid drinking alcohol for 24 hours or as told by your doctor.  Only take medicines as told by your doctor. If a tissue sample (biopsy) was taken during the procedure:   Do not take aspirin or blood thinners for 7 days, or as told by your doctor.  Do not drink alcohol for 7 days, or as told by your doctor.  Eat soft foods for the first 24 hours. GET HELP IF: You still have a small amount of blood in your poop (stool) 2-3 days after the procedure. GET HELP RIGHT AWAY IF:  You have more than a small amount of blood in your poop.  You see clumps of tissue (blood clots) in your poop.  Your belly is puffy (swollen).  You feel sick to your stomach (nauseous) or throw up (vomit).  You have a fever.  You have belly pain that gets worse and medicine does not help. MAKE SURE YOU:  Understand these instructions.  Will watch your condition.  Will get help right away if you are not doing well or get worse.   This information is not intended to replace advice given to you by your health care provider. Make sure you discuss  any questions you have with your health care provider.   Document Released: 08/12/2010 Document Revised: 07/15/2013 Document Reviewed: 03/17/2013 Elsevier Interactive Patient Education 2016 Else[employer] Inc[employer]   Colon Polyps Polyps are lumps of extra tissue growing inside the body. Polyps can grow in the large intestine (colon). Most colon polyps are noncancerous (benign). However, some colon polyps can become cancerous over time. Polyps that are larger than a pea may be harmful. To be safe, caregivers remove and test all polyps. CAUSES  Polyps form when mutations in the genes cause your cells to grow and divide even though no more tissue is needed. RISK FACTORS There are a number of risk factors that can increase your chances of getting colon polyps. They include:  Being older than 50 years.  Family history of colon polyps or colon cancer.  Long-term colon diseases, such as colitis or Crohn disease.  Being overweight.  Smoking.  Being inactive.  Drinking too much alcohol. SYMPTOMS  Most small polyps do not cause symptoms. If symptoms are present, they may include:  Blood in the stool. The stool may look dark red or black.  Constipation or diarrhea that lasts longer than 1 week. DIAGNOSIS People  often do not know they have polyps until their caregiver finds them during a regular checkup. Your caregiver can use 4 tests to check for polyps:  Digital rectal exam. The caregiver wears gloves and feels inside the rectum. This test would find polyps only in the rectum.  Barium enema. The caregiver puts a liquid called barium into your rectum before taking X-rays of your colon. Barium makes your colon look white. Polyps are dark, so they are easy to see in the X-ray pictures.  Sigmoidoscopy. A thin, flexible tube (sigmoidoscope) is placed into your rectum. The sigmoidoscope has a light and tiny camera in it. The caregiver uses the sigmoidoscope to look at the last third of your  colon.  Colonoscopy. This test is like sigmoidoscopy, but the caregiver looks at the entire colon. This is the most common method for finding and removing polyps. TREATMENT  Any polyps will be removed during a sigmoidoscopy or colonoscopy. The polyps are then tested for cancer. PREVENTION  To help lower your risk of getting more colon polyps:  Eat plenty of fruits and vegetables. Avoid eating fatty foods.  Do not smoke.  Avoid drinking alcohol.  Exercise every day.  Lose weight if recommended by your caregiver.  Eat plenty of calcium and folate. Foods that are rich in calcium include milk, cheese, and broccoli. Foods that are rich in folate include chickpeas, kidney beans, and spinach. HOME CARE INSTRUCTIONS Keep all follow-up appointments as directed by your caregiver. You may need periodic exams to check for polyps. SEEK MEDICAL CARE IF: You notice bleeding during a bowel movement.   This information is not intended to replace advice given to you by your health care provider. Make sure you discuss any questions you have with your health care provider.   Document Released: 04/05/2004 Document Revised: 07/31/2014 Document Reviewed: 09/19/2011 Elsevier Interactive Patient Education [employer].

## 2015-09-02 NOTE — H&P (Signed)
Marisa Livingston is an 80 y.o. female.   Chief Complaint: Patient is here for colonoscopy. HPI: Patient is 80 year old Caucasian female who is here for screening colonoscopy. She denies abdominal pain change in bowel habits or rectal bleeding. Last colonoscopy was over 11 years ago. Family history is negative for CRC.  Past Medical History  Diagnosis Date  . Insomnia   . Osteopenia   . Hyperlipidemia   . Allergic rhinitis     History reviewed. No pertinent past surgical history.  History reviewed. No pertinent family history. Social History:  reports that she has never smoked. She has never used smokeless tobacco. She reports that she does not drink alcohol or use illicit drugs.  Allergies:  Allergies  Allergen Reactions  . Codeine Nausea Only  . Levaquin [Levofloxacin In D5w]     Sick     Medications Prior to Admission  Medication Sig Dispense Refill  . amoxicillin-clavulanate (AUGMENTIN) 875-125 MG tablet Take 1 tablet by mouth 2 (two) times daily. 20 tablet 0  . Calcium Citrate (CITRACAL PO) Take 1 tablet by mouth daily.     . Cholecalciferol (VITAMIN D3 PO) Take 1 tablet by mouth daily.     . simvastatin (ZOCOR) 20 MG tablet One half tab daily 45 tablet 1    No results found for this or any previous visit (from the past 48 hour(s)). No results found.  ROS  Blood pressure 139/62, pulse 82, temperature 97.9 F (36.6 C), temperature source Oral, resp. rate 14, height 5\' 2"  (1.575 m), weight 160 lb (72.576 kg), SpO2 98 %. Physical Exam  Constitutional: She appears well-developed and well-nourished.  HENT:  Mouth/Throat: Oropharynx is clear and moist.  Eyes: Conjunctivae are normal. No scleral icterus.  Neck: No thyromegaly present.  Cardiovascular: Normal rate, regular rhythm and normal heart sounds.   No murmur heard. Respiratory: Effort normal and breath sounds normal.  GI: Soft. She exhibits no distension and no mass.  Musculoskeletal: She exhibits no edema.   Lymphadenopathy:    She has no cervical adenopathy.  Neurological: She is alert.  Skin: Skin is warm and dry.     Assessment/Plan Average risk screening colonoscopy.  Rogene Houston, MD 09/02/2015, 8:17 AM

## 2015-09-02 NOTE — Op Note (Signed)
COLONOSCOPY PROCEDURE REPORT  PATIENT:  Marisa Livingston  MR#:  [id number] Birthdate:  31-Aug-1935, 80 y.o., female Endoscopist:  Dr. Rogene Houston, MD Referred By:  Dr. Grace Bushy. Wolfgang Phoenix, MD  Procedure Date: 09/02/2015  Procedure:   Colonoscopy with snare polypectomy.  Indications:  Average risk screening colonoscopy.                       Last exam was in 2005.  Informed Consent:  The procedure and risks were reviewed with the patient and informed consent was obtained.  Medications:  Demerol 50 mg IV Versed 7 mg IV  First dose administered at 0820 Last dose administered at 0904  Description of procedure:  After a digital rectal exam was performed, that colonoscope was advanced from the anus through the rectum and colon to the area of the cecum, ileocecal valve and appendiceal orifice. The cecum was deeply intubated. These structures were well-seen and photographed for the record. From the level of the cecum and ileocecal valve, the scope was slowly and cautiously withdrawn. The mucosal surfaces were carefully surveyed utilizing scope tip to flexion to facilitate fold flattening as needed. The scope was pulled down into the rectum where a thorough exam including retroflexion was performed. Ultra Slim scope was also used for this procedure.  Findings:   Prep excellent. Tortuous sigmoid colon and hepatic flexure. 10 mm polyp hot snared snared in two pieces from hepatic flexure. Few scattered diverticula at sigmoid colon. Normal rectal mucosa. Small hemorrhoids above the dentate line.     Therapeutic/Diagnostic Maneuvers Performed:  See above  Complications:  None  EBL: None  Cecal Withdrawal Time:  6 minutes  Impression:  Examination performed to cecum. 10 mm polyp hot snared from hepatic flexure. Mild sigmoid colon diverticulosis. Small internal hemorrhoids.  Recommendations:  Standard instructions given. No aspirin or NSAIDs for 1 week. High-fiber diet. I will  contact patient with biopsy results and further recommendations.  Chrysta Fulcher U  09/02/2015 9:30 AM  CC: Dr. Mickie Hillier, MD & Dr. Rayne Du ref. provider found

## 2015-09-08 ENCOUNTER — Encounter (HOSPITAL_COMMUNITY): Payer: Self-pay | Admitting: Internal Medicine

## 2015-09-09 DIAGNOSIS — Z961 Presence of intraocular lens: Secondary | ICD-10-CM | POA: Diagnosis not present

## 2015-09-13 ENCOUNTER — Telehealth: Payer: Self-pay | Admitting: Family Medicine

## 2015-09-13 DIAGNOSIS — E785 Hyperlipidemia, unspecified: Secondary | ICD-10-CM

## 2015-09-13 DIAGNOSIS — Z79899 Other long term (current) drug therapy: Secondary | ICD-10-CM

## 2015-09-13 NOTE — Telephone Encounter (Signed)
Lip liv M7

## 2015-09-13 NOTE — Telephone Encounter (Signed)
Has appt mar 2 needs bw orders please   Last labs   03/19/15 Lip, Hep 07/25/14  BMP

## 2015-09-13 NOTE — Telephone Encounter (Signed)
LMRC

## 2015-09-13 NOTE — Telephone Encounter (Signed)
Discussed with pt. Orders ready.

## 2015-09-16 DIAGNOSIS — Z79899 Other long term (current) drug therapy: Secondary | ICD-10-CM | POA: Diagnosis not present

## 2015-09-16 DIAGNOSIS — E785 Hyperlipidemia, unspecified: Secondary | ICD-10-CM | POA: Diagnosis not present

## 2015-09-17 LAB — HEPATIC FUNCTION PANEL
ALBUMIN: 4.4 g/dL (ref 3.5–4.8)
ALT: 16 IU/L (ref 0–32)
AST: 24 IU/L (ref 0–40)
Alkaline Phosphatase: 61 IU/L (ref 39–117)
Bilirubin Total: 0.3 mg/dL (ref 0.0–1.2)
Bilirubin, Direct: 0.1 mg/dL (ref 0.00–0.40)
TOTAL PROTEIN: 7.2 g/dL (ref 6.0–8.5)

## 2015-09-17 LAB — BASIC METABOLIC PANEL
BUN/Creatinine Ratio: 17 (ref 11–26)
BUN: 16 mg/dL (ref 8–27)
CALCIUM: 10.1 mg/dL (ref 8.7–10.3)
CO2: 27 mmol/L (ref 18–29)
CREATININE: 0.95 mg/dL (ref 0.57–1.00)
Chloride: 100 mmol/L (ref 96–106)
GFR calc Af Amer: 67 mL/min/{1.73_m2} (ref >59)
GFR, EST NON AFRICAN AMERICAN: 58 mL/min/{1.73_m2} — AB (ref >59)
GLUCOSE: 94 mg/dL (ref 65–99)
Potassium: 4.5 mmol/L (ref 3.5–5.2)
Sodium: 141 mmol/L (ref 134–144)

## 2015-09-17 LAB — LIPID PANEL
CHOL/HDL RATIO: 2.9 ratio (ref 0.0–4.4)
Cholesterol, Total: 202 mg/dL — ABNORMAL HIGH (ref 100–199)
HDL: 70 mg/dL (ref >39)
LDL CALC: 98 mg/dL (ref 0–99)
Triglycerides: 172 mg/dL — ABNORMAL HIGH (ref 0–149)
VLDL CHOLESTEROL CAL: 34 mg/dL (ref 5–40)

## 2015-09-23 ENCOUNTER — Ambulatory Visit: Payer: Medicare Other | Admitting: Family Medicine

## 2015-09-27 ENCOUNTER — Ambulatory Visit (INDEPENDENT_AMBULATORY_CARE_PROVIDER_SITE_OTHER): Payer: Medicare Other | Admitting: Family Medicine

## 2015-09-27 VITALS — BP 118/78 | Ht 62.0 in | Wt 164.4 lb

## 2015-09-27 DIAGNOSIS — E785 Hyperlipidemia, unspecified: Secondary | ICD-10-CM

## 2015-09-27 MED ORDER — ALBUTEROL SULFATE (2.5 MG/3ML) 0.083% IN NEBU: 2.5000 mg | INHALATION_SOLUTION | Freq: Four times a day (QID) | RESPIRATORY_TRACT | Status: DC | PRN

## 2015-09-27 MED ORDER — AZITHROMYCIN 200 MG/5ML PO SUSR: ORAL | Status: DC

## 2015-09-27 MED ORDER — SIMVASTATIN 20 MG PO TABS: ORAL_TABLET | ORAL | Status: DC

## 2015-09-27 MED ORDER — PREDNISOLONE 15 MG/5ML PO SOLN: ORAL | Status: DC

## 2015-09-27 NOTE — Progress Notes (Signed)
   Subjective:    Patient ID: Marisa Livingston, female    DOB: May 14, 1936, 80 y.o.   MRN: [id number]  Hyperlipidemia This is a chronic problem. The current episode started more than 1 year ago. Treatments tried: zocor. There are no compliance problems.  Risk factors for coronary artery disease include dyslipidemia and post-menopausal.   Discuss recent lab work  Pt notes exercise  Is sporadic,  Of an on  Pt spends two to three days mother, helping care for her, Hx of cvhronic problems, stayng active with gkids  Does not miss a dose  No obv s e's     Results for orders placed or performed in visit on 09/13/15  Lipid panel  Result Value Ref Range   Cholesterol, Total 202 (H) 100 - 199 mg/dL   Triglycerides 172 (H) 0 - 149 mg/dL   HDL 70 >39 mg/dL   VLDL Cholesterol Cal 34 5 - 40 mg/dL   LDL Calculated 98 0 - 99 mg/dL   Chol/HDL Ratio 2.9 0.0 - 4.4 ratio units  Hepatic function panel  Result Value Ref Range   Total Protein 7.2 6.0 - 8.5 g/dL   Albumin 4.4 3.5 - 4.8 g/dL   Bilirubin Total 0.3 0.0 - 1.2 mg/dL   Bilirubin, Direct 0.10 0.00 - 0.40 mg/dL   Alkaline Phosphatase 61 39 - 117 IU/L   AST 24 0 - 40 IU/L   ALT 16 0 - 32 IU/L  Basic metabolic panel  Result Value Ref Range   Glucose 94 65 - 99 mg/dL   BUN 16 8 - 27 mg/dL   Creatinine, Ser 0.95 0.57 - 1.00 mg/dL   GFR calc non Af Amer 58 (L) >59 mL/min/1.73   GFR calc Af Amer 67 >59 mL/min/1.73   BUN/Creatinine Ratio 17 11 - 26   Sodium 141 134 - 144 mmol/L   Potassium 4.5 3.5 - 5.2 mmol/L   Chloride 100 96 - 106 mmol/L   CO2 27 18 - 29 mmol/L   Calcium 10.1 8.7 - 10.3 mg/dL    Review of Systems No headache, no major weight loss or weight gain, no chest pain no back pain abdominal pain no change in bowel habits complete ROS otherwise negative     Objective:   Physical Exam Alert no acute distress. HEENT normal. Lungs clear. Heart regular in rhythm.       Assessment & Plan:  Impression hyperlipidemia  results reviewed. Labs good range. Compliant with meds plan maintain same meds diet exercise discussed in encourage recheck in 6 months WSL

## 2015-10-06 DIAGNOSIS — L438 Other lichen planus: Secondary | ICD-10-CM | POA: Diagnosis not present

## 2015-10-06 DIAGNOSIS — C44719 Basal cell carcinoma of skin of left lower limb, including hip: Secondary | ICD-10-CM | POA: Diagnosis not present

## 2015-10-06 DIAGNOSIS — D1801 Hemangioma of skin and subcutaneous tissue: Secondary | ICD-10-CM | POA: Diagnosis not present

## 2015-10-06 DIAGNOSIS — L821 Other seborrheic keratosis: Secondary | ICD-10-CM | POA: Diagnosis not present

## 2015-10-06 DIAGNOSIS — L814 Other melanin hyperpigmentation: Secondary | ICD-10-CM | POA: Diagnosis not present

## 2015-10-06 DIAGNOSIS — L570 Actinic keratosis: Secondary | ICD-10-CM | POA: Diagnosis not present

## 2015-10-06 DIAGNOSIS — D225 Melanocytic nevi of trunk: Secondary | ICD-10-CM | POA: Diagnosis not present

## 2016-02-28 ENCOUNTER — Ambulatory Visit: Payer: Medicare Other | Admitting: Family Medicine

## 2016-03-22 ENCOUNTER — Other Ambulatory Visit: Payer: Self-pay

## 2016-03-22 ENCOUNTER — Telehealth: Payer: Self-pay | Admitting: Family Medicine

## 2016-03-22 DIAGNOSIS — E785 Hyperlipidemia, unspecified: Secondary | ICD-10-CM

## 2016-03-22 DIAGNOSIS — Z131 Encounter for screening for diabetes mellitus: Secondary | ICD-10-CM

## 2016-03-22 DIAGNOSIS — Z79899 Other long term (current) drug therapy: Secondary | ICD-10-CM

## 2016-03-22 NOTE — Telephone Encounter (Signed)
Lip liv glu 

## 2016-03-22 NOTE — Telephone Encounter (Signed)
Patient had Lipid, Liver, Met 7- 08/2015

## 2016-03-22 NOTE — Telephone Encounter (Signed)
Requesting blood work ordered for appointment on March 29, 2016.

## 2016-03-22 NOTE — Telephone Encounter (Signed)
Blood work ordered in EPIC. Patient notified. 

## 2016-03-24 DIAGNOSIS — Z79899 Other long term (current) drug therapy: Secondary | ICD-10-CM | POA: Diagnosis not present

## 2016-03-24 DIAGNOSIS — Z131 Encounter for screening for diabetes mellitus: Secondary | ICD-10-CM | POA: Diagnosis not present

## 2016-03-24 DIAGNOSIS — E785 Hyperlipidemia, unspecified: Secondary | ICD-10-CM | POA: Diagnosis not present

## 2016-03-25 LAB — HEPATIC FUNCTION PANEL
ALBUMIN: 4.3 g/dL (ref 3.5–4.8)
ALT: 13 IU/L (ref 0–32)
AST: 19 IU/L (ref 0–40)
Alkaline Phosphatase: 58 IU/L (ref 39–117)
BILIRUBIN TOTAL: 0.3 mg/dL (ref 0.0–1.2)
Bilirubin, Direct: 0.1 mg/dL (ref 0.00–0.40)
TOTAL PROTEIN: 7.2 g/dL (ref 6.0–8.5)

## 2016-03-25 LAB — LIPID PANEL
CHOL/HDL RATIO: 2.9 ratio (ref 0.0–4.4)
CHOLESTEROL TOTAL: 177 mg/dL (ref 100–199)
HDL: 62 mg/dL (ref >39)
LDL CALC: 80 mg/dL (ref 0–99)
Triglycerides: 176 mg/dL — ABNORMAL HIGH (ref 0–149)
VLDL Cholesterol Cal: 35 mg/dL (ref 5–40)

## 2016-03-25 LAB — GLUCOSE, RANDOM: Glucose: 94 mg/dL (ref 65–99)

## 2016-03-29 ENCOUNTER — Ambulatory Visit (INDEPENDENT_AMBULATORY_CARE_PROVIDER_SITE_OTHER): Payer: Medicare Other | Admitting: Family Medicine

## 2016-03-29 ENCOUNTER — Encounter: Payer: Self-pay | Admitting: Family Medicine

## 2016-03-29 VITALS — BP 128/72 | Ht 62.0 in | Wt 168.0 lb

## 2016-03-29 DIAGNOSIS — E785 Hyperlipidemia, unspecified: Secondary | ICD-10-CM | POA: Diagnosis not present

## 2016-03-29 NOTE — Progress Notes (Signed)
   Subjective:    Patient ID: Marisa Livingston, female    DOB: 1936-04-09, 80 y.o.   MRN: [id number]  Hyperlipidemia  This is a chronic problem. The current episode started more than 1 year ago. There are no compliance problems.    Patient states no other concerns this visit.  Patient continues to take lipid medication regularly. No obvious side effects from it. Generally does not miss a dose. Prior blood work results are reviewed with patient. Patient continues to work on fat intake in diet  Results for orders placed or performed in visit on 03/22/16  Lipid panel  Result Value Ref Range   Cholesterol, Total 177 100 - 199 mg/dL   Triglycerides 176 (H) 0 - 149 mg/dL   HDL 62 >39 mg/dL   VLDL Cholesterol Cal 35 5 - 40 mg/dL   LDL Calculated 80 0 - 99 mg/dL   Chol/HDL Ratio 2.9 0.0 - 4.4 ratio units  Hepatic function panel  Result Value Ref Range   Total Protein 7.2 6.0 - 8.5 g/dL   Albumin 4.3 3.5 - 4.8 g/dL   Bilirubin Total 0.3 0.0 - 1.2 mg/dL   Bilirubin, Direct 0.10 0.00 - 0.40 mg/dL   Alkaline Phosphatase 58 39 - 117 IU/L   AST 19 0 - 40 IU/L   ALT 13 0 - 32 IU/L  Glucose, random  Result Value Ref Range   Glucose 94 65 - 99 mg/dL   Trying to not work on the diet but not so good  Exercise off and on twice per wk on the [place]  Fifteen minutes Patient continues to take lipid medication regularly. No obvious side effects from it. Generally does not miss a dose. Prior blood work results are reviewed with patient. Patient continues to work on fat intake in diet  Review of Systems    No headache, no major weight loss or weight gain, no chest pain no back pain abdominal pain no change in bowel habits complete ROS otherwise negative  Objective:   Physical Exam   Alert vitals stable, NAD. Blood pressure good on repeat. HEENT normal. Lungs clear. Heart regular rate and rhythm.      Assessment & Plan:  Impression hyperlipidemia overall control excellent patient maintain  same meds. Wondered if this is associated with memory loss. I have told her that every possible prescription medicine has been related to every possible symptom by someone. Overall though this is not significantly associated with memory loss

## 2016-04-13 ENCOUNTER — Ambulatory Visit (INDEPENDENT_AMBULATORY_CARE_PROVIDER_SITE_OTHER): Payer: Medicare Other

## 2016-04-13 DIAGNOSIS — Z23 Encounter for immunization: Secondary | ICD-10-CM | POA: Diagnosis not present

## 2016-05-03 ENCOUNTER — Other Ambulatory Visit: Payer: Self-pay | Admitting: Family Medicine

## 2016-08-08 ENCOUNTER — Telehealth: Payer: Self-pay | Admitting: Family Medicine

## 2016-08-08 MED ORDER — SIMVASTATIN 20 MG PO TABS: 10.0000 mg | ORAL_TABLET | Freq: Every day | ORAL | 0 refills | Status: DC

## 2016-08-08 NOTE — Telephone Encounter (Signed)
Prescription sent electronically to [facility]. Patient notified.

## 2016-08-08 NOTE — Telephone Encounter (Signed)
Patient needs Rx for simvastatin to her new pharmacy [facility].

## 2016-08-17 ENCOUNTER — Ambulatory Visit (INDEPENDENT_AMBULATORY_CARE_PROVIDER_SITE_OTHER): Payer: PPO | Admitting: Nurse Practitioner

## 2016-08-17 ENCOUNTER — Encounter: Payer: Self-pay | Admitting: Nurse Practitioner

## 2016-08-17 ENCOUNTER — Other Ambulatory Visit: Payer: Self-pay | Admitting: Nurse Practitioner

## 2016-08-17 VITALS — BP 136/72 | Ht 61.5 in | Wt 167.4 lb

## 2016-08-17 DIAGNOSIS — Z79899 Other long term (current) drug therapy: Secondary | ICD-10-CM

## 2016-08-17 DIAGNOSIS — Z1231 Encounter for screening mammogram for malignant neoplasm of breast: Secondary | ICD-10-CM

## 2016-08-17 DIAGNOSIS — E785 Hyperlipidemia, unspecified: Secondary | ICD-10-CM | POA: Diagnosis not present

## 2016-08-17 DIAGNOSIS — Z01419 Encounter for gynecological examination (general) (routine) without abnormal findings: Secondary | ICD-10-CM

## 2016-08-17 NOTE — Progress Notes (Signed)
   Subjective:    Patient ID: Marisa Livingston, female    DOB: 01-12-1936, 81 y.o.   MRN: [id number]  HPI  81 yo female seen today for wellness physical.  Generally feeling well, denies any acute changes or concerns.  Post menopausal, Reports one sexual partner, denies vaginal discharge or bleeding.     Review of Systems  Constitutional: Negative for activity change, appetite change, fatigue, fever and unexpected weight change.  HENT: Negative for dental problem, ear pain, sinus pressure and sore throat.        Current with regular dental exams   Eyes:       Receives yearly eye exams, with no new reported changes   Respiratory: Negative for cough, chest tightness, shortness of breath and wheezing.   Cardiovascular: Negative for chest pain, palpitations and leg swelling.  Gastrointestinal: Negative for abdominal distention, abdominal pain, constipation, diarrhea, nausea and vomiting.  Genitourinary: Negative for difficulty urinating, dysuria, enuresis, frequency, genital sores, pelvic pain, urgency, vaginal bleeding, vaginal discharge and vaginal pain.  Musculoskeletal: Negative for gait problem and myalgias.  Allergic/Immunologic:       No new allergies   Neurological: Negative for dizziness, weakness and headaches.       Objective:   Physical Exam  Constitutional: She is oriented to person, place, and time. She appears well-developed. No distress.  HENT:  Right Ear: External ear normal.  Left Ear: External ear normal.  Mouth/Throat: Oropharynx is clear and moist.  Neck: Normal range of motion. Neck supple. No tracheal deviation present. No thyromegaly present.  Cardiovascular: Normal rate, regular rhythm and normal heart sounds.  Exam reveals no gallop.   No murmur heard. Pulmonary/Chest: Effort normal and breath sounds normal.  Abdominal: Soft. She exhibits no distension. There is no tenderness.  Genitourinary: Vagina normal and uterus normal. No vaginal discharge found.    Genitourinary Comments: External GU no rashes or lesions Vagina pale, dry, no discharge. Bimanual exam no tenderness or obvious masses   Musculoskeletal: She exhibits no edema.  Lymphadenopathy:    She has no cervical adenopathy.  Neurological: She is alert and oriented to person, place, and time.  Skin: Skin is warm and dry. No rash noted.  Psychiatric: She has a normal mood and affect. Her behavior is normal.  Vitals reviewed.  Breast exam: no masses; no palpable lymph nodes in axillary region         Assessment & Plan:   Problem List Items Addressed This Visit      Other   Hyperlipidemia LDL goal <130   Relevant Orders   Hepatic function panel   Lipid Profile    Other Visit Diagnoses    Well woman exam    -  Primary   High risk medication use         Recommend continued daily activity and Vit. D Return in about 6 months (around 02/14/2017) for recheck.

## 2016-08-23 ENCOUNTER — Ambulatory Visit (HOSPITAL_COMMUNITY)
Admission: RE | Admit: 2016-08-23 | Discharge: 2016-08-23 | Disposition: A | Discharge status: Home / Self Care | Payer: PPO | Source: Ambulatory Visit | Attending: Nurse Practitioner | Admitting: Nurse Practitioner

## 2016-08-23 DIAGNOSIS — Z1231 Encounter for screening mammogram for malignant neoplasm of breast: Secondary | ICD-10-CM | POA: Diagnosis not present

## 2016-09-11 DIAGNOSIS — Z961 Presence of intraocular lens: Secondary | ICD-10-CM | POA: Diagnosis not present

## 2016-09-18 DIAGNOSIS — E785 Hyperlipidemia, unspecified: Secondary | ICD-10-CM | POA: Diagnosis not present

## 2016-09-19 LAB — LIPID PANEL
CHOL/HDL RATIO: 2.9 ratio (ref 0.0–4.4)
Cholesterol, Total: 166 mg/dL (ref 100–199)
HDL: 57 mg/dL (ref >39)
LDL Calculated: 77 mg/dL (ref 0–99)
TRIGLYCERIDES: 158 mg/dL — AB (ref 0–149)
VLDL Cholesterol Cal: 32 mg/dL (ref 5–40)

## 2016-09-19 LAB — HEPATIC FUNCTION PANEL
ALBUMIN: 4.2 g/dL (ref 3.5–4.8)
ALK PHOS: 54 IU/L (ref 39–117)
ALT: 14 IU/L (ref 0–32)
AST: 15 IU/L (ref 0–40)
Bilirubin Total: 0.3 mg/dL (ref 0.0–1.2)
Bilirubin, Direct: 0.08 mg/dL (ref 0.00–0.40)
Total Protein: 6.6 g/dL (ref 6.0–8.5)

## 2016-09-26 ENCOUNTER — Ambulatory Visit (INDEPENDENT_AMBULATORY_CARE_PROVIDER_SITE_OTHER): Payer: PPO | Admitting: Family Medicine

## 2016-09-26 ENCOUNTER — Encounter: Payer: Self-pay | Admitting: Family Medicine

## 2016-09-26 VITALS — BP 122/78 | Ht 61.5 in | Wt 168.2 lb

## 2016-09-26 DIAGNOSIS — E785 Hyperlipidemia, unspecified: Secondary | ICD-10-CM

## 2016-09-26 MED ORDER — SIMVASTATIN 20 MG PO TABS: 10.0000 mg | ORAL_TABLET | Freq: Every day | ORAL | 1 refills | Status: DC

## 2016-09-26 NOTE — Progress Notes (Signed)
   Subjective:    Patient ID: Marisa Livingston, female    DOB: 19-Nov-1935, 81 y.o.   MRN: [id number]  Hyperlipidemia  This is a chronic problem. The current episode started more than 1 year ago. Treatments tried: zocor. There are no compliance problems.    Results for orders placed or performed in visit on 08/17/16  Hepatic function panel  Result Value Ref Range   Total Protein 6.6 6.0 - 8.5 g/dL   Albumin 4.2 3.5 - 4.8 g/dL   Bilirubin Total 0.3 0.0 - 1.2 mg/dL   Bilirubin, Direct 0.08 0.00 - 0.40 mg/dL   Alkaline Phosphatase 54 39 - 117 IU/L   AST 15 0 - 40 IU/L   ALT 14 0 - 32 IU/L  Lipid Profile  Result Value Ref Range   Cholesterol, Total 166 100 - 199 mg/dL   Triglycerides 158 (H) 0 - 149 mg/dL   HDL 57 >39 mg/dL   VLDL Cholesterol Cal 32 5 - 40 mg/dL   LDL Calculated 77 0 - 99 mg/dL   Chol/HDL Ratio 2.9 0.0 - 4.4 ratio units    Patient continues to take lipid medication regularly. No obvious side effects from it. Generally does not miss a dose. Prior blood work results are reviewed with patient. Patient continues to work on fat intake in diet  Not exrcising as well these days, doing a lot of caretaking  Trying to get out more and be more active     Review of Systems No headache, no major weight loss or weight gain, no chest pain no back pain abdominal pain no change in bowel habits complete ROS otherwise negative     Objective:   Physical Exam  Alert vitals stable, NAD. Blood pressure good on repeat. HEENT normal. Lungs clear. Heart regular rate and rhythm.       Assessment & Plan:  Impression 1 hyperlipidemia discussed compliance discussed maintain same meds blood work reviewed plan diet exercise discussed meds refilled recheck in 6 months. Blood pressure slightly elevated today but overall good in the past year

## 2016-10-02 ENCOUNTER — Encounter: Payer: Self-pay | Admitting: Nurse Practitioner

## 2016-10-19 DIAGNOSIS — L820 Inflamed seborrheic keratosis: Secondary | ICD-10-CM | POA: Diagnosis not present

## 2016-10-19 DIAGNOSIS — D1801 Hemangioma of skin and subcutaneous tissue: Secondary | ICD-10-CM | POA: Diagnosis not present

## 2016-10-19 DIAGNOSIS — D235 Other benign neoplasm of skin of trunk: Secondary | ICD-10-CM | POA: Diagnosis not present

## 2016-10-19 DIAGNOSIS — Z85828 Personal history of other malignant neoplasm of skin: Secondary | ICD-10-CM | POA: Diagnosis not present

## 2016-10-19 DIAGNOSIS — L814 Other melanin hyperpigmentation: Secondary | ICD-10-CM | POA: Diagnosis not present

## 2016-10-19 DIAGNOSIS — L821 Other seborrheic keratosis: Secondary | ICD-10-CM | POA: Diagnosis not present

## 2016-10-19 DIAGNOSIS — L570 Actinic keratosis: Secondary | ICD-10-CM | POA: Diagnosis not present

## 2016-10-25 ENCOUNTER — Telehealth: Payer: Self-pay | Admitting: Family Medicine

## 2016-10-25 NOTE — Telephone Encounter (Signed)
Please see dermatopathology report in blue folder in office.  

## 2017-03-27 ENCOUNTER — Telehealth: Payer: Self-pay | Admitting: Family Medicine

## 2017-03-27 DIAGNOSIS — E785 Hyperlipidemia, unspecified: Secondary | ICD-10-CM

## 2017-03-27 DIAGNOSIS — Z79899 Other long term (current) drug therapy: Secondary | ICD-10-CM

## 2017-03-27 NOTE — Telephone Encounter (Signed)
Last labs 09/18/16 lipid, liver

## 2017-03-27 NOTE — Telephone Encounter (Signed)
Orders ready. Pt notified.  

## 2017-03-27 NOTE — Telephone Encounter (Signed)
Requesting order for labs.  She has an appointment on 04/03/17 with Dr. Richardson Landry.

## 2017-03-27 NOTE — Telephone Encounter (Signed)
Lip liv m7 

## 2017-03-29 ENCOUNTER — Ambulatory Visit: Payer: PPO | Admitting: Family Medicine

## 2017-04-02 ENCOUNTER — Ambulatory Visit: Payer: PPO | Admitting: Family Medicine

## 2017-04-03 ENCOUNTER — Ambulatory Visit: Payer: PPO | Admitting: Family Medicine

## 2017-04-03 DIAGNOSIS — Z79899 Other long term (current) drug therapy: Secondary | ICD-10-CM | POA: Diagnosis not present

## 2017-04-03 DIAGNOSIS — E785 Hyperlipidemia, unspecified: Secondary | ICD-10-CM | POA: Diagnosis not present

## 2017-04-04 LAB — LIPID PANEL
Chol/HDL Ratio: 2.4 ratio (ref 0.0–4.4)
Cholesterol, Total: 166 mg/dL (ref 100–199)
HDL: 70 mg/dL (ref >39)
LDL Calculated: 74 mg/dL (ref 0–99)
Triglycerides: 109 mg/dL (ref 0–149)
VLDL Cholesterol Cal: 22 mg/dL (ref 5–40)

## 2017-04-04 LAB — BASIC METABOLIC PANEL
BUN/Creatinine Ratio: 18 (ref 12–28)
BUN: 18 mg/dL (ref 8–27)
CALCIUM: 9.9 mg/dL (ref 8.7–10.3)
CO2: 26 mmol/L (ref 20–29)
Chloride: 105 mmol/L (ref 96–106)
Creatinine, Ser: 1 mg/dL (ref 0.57–1.00)
GFR calc non Af Amer: 54 mL/min/{1.73_m2} — ABNORMAL LOW (ref >59)
GFR, EST AFRICAN AMERICAN: 62 mL/min/{1.73_m2} (ref >59)
Glucose: 92 mg/dL (ref 65–99)
Potassium: 5.4 mmol/L — ABNORMAL HIGH (ref 3.5–5.2)
Sodium: 145 mmol/L — ABNORMAL HIGH (ref 134–144)

## 2017-04-04 LAB — HEPATIC FUNCTION PANEL
ALK PHOS: 60 IU/L (ref 39–117)
ALT: 12 IU/L (ref 0–32)
AST: 19 IU/L (ref 0–40)
Albumin: 4.6 g/dL (ref 3.5–4.8)
BILIRUBIN TOTAL: 0.3 mg/dL (ref 0.0–1.2)
BILIRUBIN, DIRECT: 0.11 mg/dL (ref 0.00–0.40)
TOTAL PROTEIN: 7.3 g/dL (ref 6.0–8.5)

## 2017-04-10 ENCOUNTER — Ambulatory Visit (INDEPENDENT_AMBULATORY_CARE_PROVIDER_SITE_OTHER): Payer: PPO | Admitting: Family Medicine

## 2017-04-10 ENCOUNTER — Encounter: Payer: Self-pay | Admitting: Family Medicine

## 2017-04-10 VITALS — BP 130/82 | Ht 61.5 in | Wt 165.4 lb

## 2017-04-10 DIAGNOSIS — E785 Hyperlipidemia, unspecified: Secondary | ICD-10-CM

## 2017-04-10 DIAGNOSIS — Z23 Encounter for immunization: Secondary | ICD-10-CM | POA: Diagnosis not present

## 2017-04-10 MED ORDER — SIMVASTATIN 20 MG PO TABS: 10.0000 mg | ORAL_TABLET | Freq: Every day | ORAL | 1 refills | Status: DC

## 2017-04-10 NOTE — Progress Notes (Signed)
   Subjective:    Patient ID: Marisa Livingston, female    DOB: 09-16-1935, 81 y.o.   MRN: [id number]  Hyperlipidemia  This is a chronic problem. The current episode started more than 1 year ago. Treatments tried: zocor. There are no compliance problems.  Risk factors for coronary artery disease include dyslipidemia and post-menopausal.   Results for orders placed or performed in visit on 03/27/17  Lipid panel  Result Value Ref Range   Cholesterol, Total 166 100 - 199 mg/dL   Triglycerides 109 0 - 149 mg/dL   HDL 70 >39 mg/dL   VLDL Cholesterol Cal 22 5 - 40 mg/dL   LDL Calculated 74 0 - 99 mg/dL   Chol/HDL Ratio 2.4 0.0 - 4.4 ratio  Hepatic function panel  Result Value Ref Range   Total Protein 7.3 6.0 - 8.5 g/dL   Albumin 4.6 3.5 - 4.8 g/dL   Bilirubin Total 0.3 0.0 - 1.2 mg/dL   Bilirubin, Direct 0.11 0.00 - 0.40 mg/dL   Alkaline Phosphatase 60 39 - 117 IU/L   AST 19 0 - 40 IU/L   ALT 12 0 - 32 IU/L  Basic metabolic panel  Result Value Ref Range   Glucose 92 65 - 99 mg/dL   BUN 18 8 - 27 mg/dL   Creatinine, Ser 1.00 0.57 - 1.00 mg/dL   GFR calc non Af Amer 54 (L) >59 mL/min/1.73   GFR calc Af Amer 62 >59 mL/min/1.73   BUN/Creatinine Ratio 18 12 - 28   Sodium 145 (H) 134 - 144 mmol/L   Potassium 5.4 (H) 3.5 - 5.2 mmol/L   Chloride 105 96 - 106 mmol/L   CO2 26 20 - 29 mmol/L   Calcium 9.9 8.7 - 10.3 mg/dL    Patient continues to take lipid medication regularly. No obvious side effects from it. Generally does not miss a dose. Prior blood work results are reviewed with patient. 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 Blood pressure good on repeat. HEENT normal. Lungs  clear. Heart regular rate and rhythm.       Assessment & Plan:  Impression hyperlipidemia good control discussed maintain same meds #2 increased stress caring for mother with dementia, discussed, follow-up in 6 months blood work discussed/flu shot given

## 2017-04-10 NOTE — Patient Instructions (Signed)
Results for orders placed or performed in visit on 03/27/17  Lipid panel  Result Value Ref Range   Cholesterol, Total 166 100 - 199 mg/dL   Triglycerides 109 0 - 149 mg/dL   HDL 70 >39 mg/dL   VLDL Cholesterol Cal 22 5 - 40 mg/dL   LDL Calculated 74 0 - 99 mg/dL   Chol/HDL Ratio 2.4 0.0 - 4.4 ratio  Hepatic function panel  Result Value Ref Range   Total Protein 7.3 6.0 - 8.5 g/dL   Albumin 4.6 3.5 - 4.8 g/dL   Bilirubin Total 0.3 0.0 - 1.2 mg/dL   Bilirubin, Direct 0.11 0.00 - 0.40 mg/dL   Alkaline Phosphatase 60 39 - 117 IU/L   AST 19 0 - 40 IU/L   ALT 12 0 - 32 IU/L  Basic metabolic panel  Result Value Ref Range   Glucose 92 65 - 99 mg/dL   BUN 18 8 - 27 mg/dL   Creatinine, Ser 1.00 0.57 - 1.00 mg/dL   GFR calc non Af Amer 54 (L) >59 mL/min/1.73   GFR calc Af Amer 62 >59 mL/min/1.73   BUN/Creatinine Ratio 18 12 - 28   Sodium 145 (H) 134 - 144 mmol/L   Potassium 5.4 (H) 3.5 - 5.2 mmol/L   Chloride 105 96 - 106 mmol/L   CO2 26 20 - 29 mmol/L   Calcium 9.9 8.7 - 10.3 mg/dL

## 2017-09-17 ENCOUNTER — Other Ambulatory Visit: Payer: Self-pay | Admitting: Nurse Practitioner

## 2017-09-17 ENCOUNTER — Ambulatory Visit (INDEPENDENT_AMBULATORY_CARE_PROVIDER_SITE_OTHER): Payer: PPO | Admitting: Nurse Practitioner

## 2017-09-17 ENCOUNTER — Encounter: Payer: Self-pay | Admitting: Nurse Practitioner

## 2017-09-17 VITALS — BP 132/84 | Ht 61.5 in | Wt 168.8 lb

## 2017-09-17 DIAGNOSIS — Z780 Asymptomatic menopausal state: Secondary | ICD-10-CM | POA: Diagnosis not present

## 2017-09-17 DIAGNOSIS — Z79899 Other long term (current) drug therapy: Secondary | ICD-10-CM | POA: Diagnosis not present

## 2017-09-17 DIAGNOSIS — E785 Hyperlipidemia, unspecified: Secondary | ICD-10-CM | POA: Diagnosis not present

## 2017-09-17 DIAGNOSIS — Z01419 Encounter for gynecological examination (general) (routine) without abnormal findings: Secondary | ICD-10-CM | POA: Diagnosis not present

## 2017-09-17 DIAGNOSIS — Z1231 Encounter for screening mammogram for malignant neoplasm of breast: Secondary | ICD-10-CM

## 2017-09-17 NOTE — Progress Notes (Signed)
   Subjective:    Patient ID: Marisa Livingston, female    DOB: 04-13-1936, 82 y.o.   MRN: [id number]  HPI presents for her wellness exam. Overall healthy diet. No vaginal bleeding or pelvic pain. No new sexual partners. Had colonoscopy in 2017; due for repeat in 5 years. Regular vision and dental care. Walks on treadmill for activity.     Review of Systems  Constitutional: Negative for activity change, appetite change and fatigue.  HENT: Negative for dental problem, ear pain, sinus pressure and sore throat.   Respiratory: Negative for cough, chest tightness, shortness of breath and wheezing.   Cardiovascular: Negative for chest pain.  Gastrointestinal: Positive for constipation. Negative for abdominal distention, abdominal pain, blood in stool, diarrhea, nausea and vomiting.       Occasional constipation relieved with OTC suppository  Genitourinary: Negative for difficulty urinating, dysuria, enuresis, frequency, genital sores, pelvic pain, urgency, vaginal bleeding and vaginal discharge.   Depression screen [facility] 2/9 09/17/2017 08/17/2016 12/24/2014  Decreased Interest 0 0 0  Down, Depressed, Hopeless 0 0 0  PHQ - 2 Score 0 0 0        Objective:   Physical Exam  Constitutional: She is oriented to person, place, and time. She appears well-developed. No distress.  HENT:  Right Ear: External ear normal.  Left Ear: External ear normal.  Mouth/Throat: Oropharynx is clear and moist.  Neck: Normal range of motion. Neck supple. No tracheal deviation present. No thyromegaly present.  Cardiovascular: Normal rate, regular rhythm and normal heart sounds. Exam reveals no gallop.  No murmur heard. Pulmonary/Chest: Effort normal and breath sounds normal. Right breast exhibits no inverted nipple, no mass, no skin change and no tenderness. Left breast exhibits no inverted nipple, no mass, no skin change and no tenderness. Breasts are symmetrical.  Axillae no adenopathy.   Abdominal: Soft. She exhibits  no distension. There is no tenderness.  Genitourinary:  Genitourinary Comments: Declines GU and pelvic exam; no problems.   Musculoskeletal: She exhibits no edema.  Lymphadenopathy:    She has no cervical adenopathy.  Neurological: She is alert and oriented to person, place, and time.  Skin: Skin is warm and dry. No rash noted.  Psychiatric: She has a normal mood and affect. Her behavior is normal.  Vitals reviewed.         Assessment & Plan:  Well woman exam - Plan: DG Bone Density, CANCELED: DG Bone Density, CANCELED: MM Digital Screening  Hyperlipidemia LDL goal <130 - Plan: Lipid Profile  High risk medication use - Plan: Hepatic function panel  Post-menopausal - Plan: DG Bone Density  Continue activity and healthy diet. Recommend daily vitamin D and calcium supplementation. Return in about 6 months (around 03/17/2018) for routine follow up.

## 2017-09-24 ENCOUNTER — Ambulatory Visit (HOSPITAL_COMMUNITY)
Admission: RE | Admit: 2017-09-24 | Discharge: 2017-09-24 | Disposition: A | Discharge status: Home / Self Care | Payer: PPO | Source: Ambulatory Visit | Attending: Nurse Practitioner | Admitting: Nurse Practitioner

## 2017-09-24 DIAGNOSIS — Z780 Asymptomatic menopausal state: Secondary | ICD-10-CM | POA: Diagnosis not present

## 2017-09-24 DIAGNOSIS — Z01411 Encounter for gynecological examination (general) (routine) with abnormal findings: Secondary | ICD-10-CM | POA: Diagnosis not present

## 2017-09-24 DIAGNOSIS — M8589 Other specified disorders of bone density and structure, multiple sites: Secondary | ICD-10-CM | POA: Insufficient documentation

## 2017-09-24 DIAGNOSIS — Z01419 Encounter for gynecological examination (general) (routine) without abnormal findings: Secondary | ICD-10-CM | POA: Diagnosis present

## 2017-09-24 DIAGNOSIS — Z1231 Encounter for screening mammogram for malignant neoplasm of breast: Secondary | ICD-10-CM | POA: Diagnosis not present

## 2017-10-01 DIAGNOSIS — Z79899 Other long term (current) drug therapy: Secondary | ICD-10-CM | POA: Diagnosis not present

## 2017-10-01 DIAGNOSIS — E785 Hyperlipidemia, unspecified: Secondary | ICD-10-CM | POA: Diagnosis not present

## 2017-10-02 LAB — LIPID PANEL
CHOLESTEROL TOTAL: 164 mg/dL (ref 100–199)
Chol/HDL Ratio: 2.6 ratio (ref 0.0–4.4)
HDL: 64 mg/dL (ref >39)
LDL CALC: 78 mg/dL (ref 0–99)
TRIGLYCERIDES: 109 mg/dL (ref 0–149)
VLDL Cholesterol Cal: 22 mg/dL (ref 5–40)

## 2017-10-02 LAB — HEPATIC FUNCTION PANEL
ALBUMIN: 4.3 g/dL (ref 3.5–4.8)
ALT: 12 IU/L (ref 0–32)
AST: 19 IU/L (ref 0–40)
Alkaline Phosphatase: 56 IU/L (ref 39–117)
BILIRUBIN TOTAL: 0.3 mg/dL (ref 0.0–1.2)
Bilirubin, Direct: 0.07 mg/dL (ref 0.00–0.40)
Total Protein: 6.7 g/dL (ref 6.0–8.5)

## 2017-10-03 ENCOUNTER — Other Ambulatory Visit: Payer: Self-pay | Admitting: Nurse Practitioner

## 2017-10-03 MED ORDER — ALENDRONATE SODIUM 70 MG PO TABS: 70.0000 mg | ORAL_TABLET | ORAL | 11 refills | Status: DC

## 2017-10-05 ENCOUNTER — Encounter: Payer: Self-pay | Admitting: Family Medicine

## 2017-10-05 ENCOUNTER — Ambulatory Visit (INDEPENDENT_AMBULATORY_CARE_PROVIDER_SITE_OTHER): Payer: PPO | Admitting: Family Medicine

## 2017-10-05 VITALS — BP 122/78 | Temp 98.7°F | Ht 61.5 in | Wt 168.6 lb

## 2017-10-05 DIAGNOSIS — B9689 Other specified bacterial agents as the cause of diseases classified elsewhere: Secondary | ICD-10-CM | POA: Diagnosis not present

## 2017-10-05 DIAGNOSIS — J019 Acute sinusitis, unspecified: Secondary | ICD-10-CM

## 2017-10-05 MED ORDER — BENZONATATE 100 MG PO CAPS: 100.0000 mg | ORAL_CAPSULE | Freq: Three times a day (TID) | ORAL | 0 refills | Status: DC | PRN

## 2017-10-05 MED ORDER — ALENDRONATE SODIUM 70 MG PO TABS: 70.0000 mg | ORAL_TABLET | ORAL | 11 refills | Status: DC

## 2017-10-05 MED ORDER — AMOXICILLIN 500 MG PO TABS
500.0000 mg | ORAL_TABLET | Freq: Three times a day (TID) | ORAL | 0 refills | Status: DC
Start: 2017-10-05 — End: 2017-10-15

## 2017-10-05 NOTE — Progress Notes (Signed)
   Subjective:    Patient ID: Marisa Livingston, female    DOB: 31-Dec-1935, 83 y.o.   MRN: [id number]  Cough  This is a new problem. The current episode started yesterday. Associated symptoms include headaches, nasal congestion, rhinorrhea and a sore throat. Pertinent negatives include no chest pain, ear pain, fever, shortness of breath or wheezing. Treatments tried: tylenol.  Viral-like illness for a few days head congestion drainage she denies any fever.  She did states she had some chills but they were not shaking chills more so that she just felt cold no sweats no wheezing no difficulty breathing.  PMH benign.    Review of Systems  Constitutional: Negative for activity change and fever.  HENT: Positive for congestion, rhinorrhea and sore throat. Negative for ear pain.   Eyes: Negative for discharge.  Respiratory: Positive for cough. Negative for shortness of breath and wheezing.   Cardiovascular: Negative for chest pain.  Neurological: Positive for headaches.       Objective:   Physical Exam  Constitutional: She appears well-developed.  HENT:  Head: Normocephalic.  Right Ear: External ear normal.  Left Ear: External ear normal.  Nose: Nose normal.  Mouth/Throat: Oropharynx is clear and moist. No oropharyngeal exudate.  Eyes: Right eye exhibits no discharge. Left eye exhibits no discharge.  Neck: Neck supple. No tracheal deviation present.  Cardiovascular: Normal rate and normal heart sounds.  No murmur heard. Pulmonary/Chest: Effort normal and breath sounds normal. She has no wheezes. She has no rales.  Lymphadenopathy:    She has no cervical adenopathy.  Skin: Skin is warm and dry.  Nursing note and vitals reviewed.         Assessment & Plan:  I doubt she has the flu Probable viral illness Possible early sinus infection Antibiotics were prescribed follow-up recommended if progressive troubles or if worse I do not feel Tamiflu necessary If high fevers severe joint  pains or other problems occur patient is to notify us

## 2017-10-08 ENCOUNTER — Ambulatory Visit: Payer: PPO | Admitting: Family Medicine

## 2017-10-15 ENCOUNTER — Encounter: Payer: Self-pay | Admitting: Family Medicine

## 2017-10-15 ENCOUNTER — Ambulatory Visit (INDEPENDENT_AMBULATORY_CARE_PROVIDER_SITE_OTHER): Payer: PPO | Admitting: Family Medicine

## 2017-10-15 VITALS — BP 124/84 | Ht 61.5 in | Wt 167.0 lb

## 2017-10-15 DIAGNOSIS — J4520 Mild intermittent asthma, uncomplicated: Secondary | ICD-10-CM

## 2017-10-15 DIAGNOSIS — E785 Hyperlipidemia, unspecified: Secondary | ICD-10-CM

## 2017-10-15 MED ORDER — ALBUTEROL SULFATE HFA 108 (90 BASE) MCG/ACT IN AERS: 2.0000 | INHALATION_SPRAY | Freq: Four times a day (QID) | RESPIRATORY_TRACT | 2 refills | Status: DC | PRN

## 2017-10-15 MED ORDER — SIMVASTATIN 20 MG PO TABS: 10.0000 mg | ORAL_TABLET | Freq: Every day | ORAL | 1 refills | Status: DC

## 2017-10-15 NOTE — Progress Notes (Signed)
   Subjective:    Patient ID: Marisa Livingston, female    DOB: 08/03/1935, 82 y.o.   MRN: [id number]  Hyperlipidemia  This is a chronic problem. The current episode started more than 1 year ago. Treatments tried: zocor. There are no compliance problems.  Risk factors for coronary artery disease include post-menopausal and dyslipidemia.   Results for orders placed or performed in visit on 09/17/17  Lipid Profile  Result Value Ref Range   Cholesterol, Total 164 100 - 199 mg/dL   Triglycerides 109 0 - 149 mg/dL   HDL 64 >39 mg/dL   VLDL Cholesterol Cal 22 5 - 40 mg/dL   LDL Calculated 78 0 - 99 mg/dL   Chol/HDL Ratio 2.6 0.0 - 4.4 ratio  Hepatic function panel  Result Value Ref Range   Total Protein 6.7 6.0 - 8.5 g/dL   Albumin 4.3 3.5 - 4.8 g/dL   Bilirubin Total 0.3 0.0 - 1.2 mg/dL   Bilirubin, Direct 0.07 0.00 - 0.40 mg/dL   Alkaline Phosphatase 56 39 - 117 IU/L   AST 19 0 - 40 IU/L   ALT 12 0 - 32 IU/L   Patient also reports lingering cough-was seen last week and given Amoxil and Tessalon Perles but still has lingering cough.  Pt took abx all up  Has had a bad cough cannot get rid of   No energy at all  Dim appetite  Wondered if abx was making feel bad  No fever   No sig h a or muscl aches   Mild chill    Patient continues to take lipid medication regularly. No obvious side effects from it. Generally does not miss a dose. Prior blood work results are reviewed with patient. Patient continues to work on fat intake in diet    Review of Systems No headache, no major weight loss or weight gain, no chest pain no back pain abdominal pain no change in bowel habits complete ROS otherwise negative     Objective:   Physical Exam  Alert and oriented, vitals reviewed and stable, NAD ENT-TM's and ext canals WNL bilat via otoscopic exam Soft palate, tonsils and post pharynx WNL via oropharyngeal exam Neck-symmetric, no masses; thyroid nonpalpable and nontender Pulmonary-no  tachypnea or accessory muscle use; Positive expiratory wheezes via auscultation Card--no abnrml murmurs, rhythm reg and rate WNL Carotid pulses symmetric, without bruits Positive expiratory      Assessment & Plan:  Impression hyperlipidemia.1 discussed at length.  To maintain same medication blood work reviewed.  Dietary interventions discussed  2.  Status post influenza.  With persistent cough.  And substantial reactive airway albuterol 2 sprays 4 times daily may also add Robitussin-DM.  No further antibiotics at this time.  Warning signs discussed.  Long discussion this is a complication of the foot proper use discussed  Greater than 50% of this 25 minute face to face visit was spent in counseling and discussion and coordination of care regarding the above diagnosis/diagnosies  follow-up in 6 months

## 2017-10-19 DIAGNOSIS — L814 Other melanin hyperpigmentation: Secondary | ICD-10-CM | POA: Diagnosis not present

## 2017-10-19 DIAGNOSIS — D485 Neoplasm of uncertain behavior of skin: Secondary | ICD-10-CM | POA: Diagnosis not present

## 2017-10-19 DIAGNOSIS — L570 Actinic keratosis: Secondary | ICD-10-CM | POA: Diagnosis not present

## 2017-10-19 DIAGNOSIS — D225 Melanocytic nevi of trunk: Secondary | ICD-10-CM | POA: Diagnosis not present

## 2017-10-19 DIAGNOSIS — L821 Other seborrheic keratosis: Secondary | ICD-10-CM | POA: Diagnosis not present

## 2017-10-19 DIAGNOSIS — D1801 Hemangioma of skin and subcutaneous tissue: Secondary | ICD-10-CM | POA: Diagnosis not present

## 2017-10-22 DIAGNOSIS — H5213 Myopia, bilateral: Secondary | ICD-10-CM | POA: Diagnosis not present

## 2017-10-22 DIAGNOSIS — H524 Presbyopia: Secondary | ICD-10-CM | POA: Diagnosis not present

## 2017-10-22 DIAGNOSIS — H52221 Regular astigmatism, right eye: Secondary | ICD-10-CM | POA: Diagnosis not present

## 2017-10-22 DIAGNOSIS — Z961 Presence of intraocular lens: Secondary | ICD-10-CM | POA: Diagnosis not present

## 2017-10-24 ENCOUNTER — Telehealth: Payer: Self-pay | Admitting: Family Medicine

## 2017-10-24 NOTE — Telephone Encounter (Signed)
Put in [place] box 10/24/2017 at 5:13pm.

## 2017-10-24 NOTE — Telephone Encounter (Signed)
Review dermatopathology report in results folder from [facility].

## 2017-10-24 NOTE — Telephone Encounter (Signed)
Showed actinic keratosis under the care of dermatology

## 2017-10-30 ENCOUNTER — Ambulatory Visit (INDEPENDENT_AMBULATORY_CARE_PROVIDER_SITE_OTHER): Payer: PPO | Admitting: Family Medicine

## 2017-10-30 ENCOUNTER — Encounter: Payer: Self-pay | Admitting: Family Medicine

## 2017-10-30 VITALS — BP 144/78 | Temp 98.5°F | Ht 61.5 in | Wt 169.0 lb

## 2017-10-30 DIAGNOSIS — R490 Dysphonia: Secondary | ICD-10-CM

## 2017-10-30 NOTE — Progress Notes (Signed)
   Subjective:    Patient ID: Marisa Livingston, female    DOB: June 28, 1936, 82 y.o.   MRN: [id number]  HPI Patient states she is back today to follow up on the flu she had last week. She states she can not get her strength back, and she wanted to make sure her lungs are clear and she also is hoarse.No other symptoms nor fever.  Patient notes persistent cough though substantially improved.  Patient notes persistent hoarseness though also improved  Patient also notes fatigue however improved.  She relates hoarseness to the inhaler no shortness of breath      Review of Systems No headache, no major weight loss or weight gain, no chest pain no back pain abdominal pain no change in bowel habits complete ROS otherwise negative     Objective:   Physical Exam   Alert vitals stable, NAD. Blood pressure good on repeat. HEENT normal. Lungs clear. Heart regular rate and rhythm.      Assessment & Plan:  Impression status post fall with residual symptoms.  Expect gradual resolution discussed/warning signs discussed

## 2018-04-08 ENCOUNTER — Telehealth: Payer: Self-pay | Admitting: Family Medicine

## 2018-04-08 DIAGNOSIS — E785 Hyperlipidemia, unspecified: Secondary | ICD-10-CM

## 2018-04-08 DIAGNOSIS — Z79899 Other long term (current) drug therapy: Secondary | ICD-10-CM

## 2018-04-08 NOTE — Telephone Encounter (Signed)
Rep same 

## 2018-04-08 NOTE — Telephone Encounter (Signed)
Patient calling in regards of having an appt scheduled on 04/17/18 for a  6 month follow up with Dr. Richardson Landry. Patient is wondering if she needs any labs for this appt if so to send order to lab ahead of time. Please advise and inform pt of further instructions.

## 2018-04-08 NOTE — Telephone Encounter (Signed)
Last labs 9/18: Lipid, Liver, Met 7 

## 2018-04-08 NOTE — Telephone Encounter (Signed)
Blood work ordered in Epic. Patient notified. 

## 2018-04-09 DIAGNOSIS — E785 Hyperlipidemia, unspecified: Secondary | ICD-10-CM | POA: Diagnosis not present

## 2018-04-09 DIAGNOSIS — Z79899 Other long term (current) drug therapy: Secondary | ICD-10-CM | POA: Diagnosis not present

## 2018-04-10 LAB — LIPID PANEL
CHOL/HDL RATIO: 3.6 ratio (ref 0.0–4.4)
CHOLESTEROL TOTAL: 178 mg/dL (ref 100–199)
HDL: 49 mg/dL (ref >39)
LDL CALC: 86 mg/dL (ref 0–99)
TRIGLYCERIDES: 214 mg/dL — AB (ref 0–149)
VLDL Cholesterol Cal: 43 mg/dL — ABNORMAL HIGH (ref 5–40)

## 2018-04-10 LAB — BASIC METABOLIC PANEL
BUN / CREAT RATIO: 16 (ref 12–28)
BUN: 19 mg/dL (ref 8–27)
CHLORIDE: 103 mmol/L (ref 96–106)
CO2: 26 mmol/L (ref 20–29)
Calcium: 9.7 mg/dL (ref 8.7–10.3)
Creatinine, Ser: 1.21 mg/dL — ABNORMAL HIGH (ref 0.57–1.00)
GFR, EST AFRICAN AMERICAN: 49 mL/min/{1.73_m2} — AB (ref >59)
GFR, EST NON AFRICAN AMERICAN: 43 mL/min/{1.73_m2} — AB (ref >59)
Glucose: 96 mg/dL (ref 65–99)
POTASSIUM: 4.9 mmol/L (ref 3.5–5.2)
Sodium: 144 mmol/L (ref 134–144)

## 2018-04-10 LAB — HEPATIC FUNCTION PANEL
ALT: 11 IU/L (ref 0–32)
AST: 20 IU/L (ref 0–40)
Albumin: 4.1 g/dL (ref 3.5–4.8)
Alkaline Phosphatase: 62 IU/L (ref 39–117)
Bilirubin Total: 0.5 mg/dL (ref 0.0–1.2)
Bilirubin, Direct: 0.13 mg/dL (ref 0.00–0.40)
Total Protein: 7 g/dL (ref 6.0–8.5)

## 2018-04-12 ENCOUNTER — Ambulatory Visit (INDEPENDENT_AMBULATORY_CARE_PROVIDER_SITE_OTHER): Payer: PPO | Admitting: *Deleted

## 2018-04-12 DIAGNOSIS — Z23 Encounter for immunization: Secondary | ICD-10-CM | POA: Diagnosis not present

## 2018-04-17 ENCOUNTER — Encounter: Payer: Self-pay | Admitting: Family Medicine

## 2018-04-17 ENCOUNTER — Ambulatory Visit (INDEPENDENT_AMBULATORY_CARE_PROVIDER_SITE_OTHER): Payer: PPO | Admitting: Family Medicine

## 2018-04-17 VITALS — BP 130/76 | Ht 61.5 in | Wt 172.6 lb

## 2018-04-17 DIAGNOSIS — E785 Hyperlipidemia, unspecified: Secondary | ICD-10-CM | POA: Diagnosis not present

## 2018-04-17 DIAGNOSIS — Z79899 Other long term (current) drug therapy: Secondary | ICD-10-CM

## 2018-04-17 DIAGNOSIS — N183 Chronic kidney disease, stage 3 unspecified: Secondary | ICD-10-CM

## 2018-04-17 DIAGNOSIS — Z780 Asymptomatic menopausal state: Secondary | ICD-10-CM | POA: Diagnosis not present

## 2018-04-17 NOTE — Progress Notes (Signed)
   Subjective:    Patient ID: Marisa Livingston, female    DOB: 13-Nov-1935, 82 y.o.   MRN: [id number] Patient arrives with multiple issues Hyperlipidemia  This is a chronic problem. There are no compliance problems (takes med every day, eats healthy, exercises).    Pt states no concerns today. Had flu vaccine last week.    Staying alright with exercise and walking  Patient continues to take lipid medication regularly. No obvious side effects from it. Generally does not miss a dose. Prior blood work results are reviewed with patient. Patient continues to work on fat intake in diet   Diet is teribe, overall fairly good,  Flu shot given last Friday    Results for orders placed or performed in visit on 04/08/18  Lipid panel  Result Value Ref Range   Cholesterol, Total 178 100 - 199 mg/dL   Triglycerides 214 (H) 0 - 149 mg/dL   HDL 49 >39 mg/dL   VLDL Cholesterol Cal 43 (H) 5 - 40 mg/dL   LDL Calculated 86 0 - 99 mg/dL   Chol/HDL Ratio 3.6 0.0 - 4.4 ratio  Basic metabolic panel  Result Value Ref Range   Glucose 96 65 - 99 mg/dL   BUN 19 8 - 27 mg/dL   Creatinine, Ser 1.21 (H) 0.57 - 1.00 mg/dL   GFR calc non Af Amer 43 (L) >59 mL/min/1.73   GFR calc Af Amer 49 (L) >59 mL/min/1.73   BUN/Creatinine Ratio 16 12 - 28   Sodium 144 134 - 144 mmol/L   Potassium 4.9 3.5 - 5.2 mmol/L   Chloride 103 96 - 106 mmol/L   CO2 26 20 - 29 mmol/L   Calcium 9.7 8.7 - 10.3 mg/dL  Hepatic function panel  Result Value Ref Range   Total Protein 7.0 6.0 - 8.5 g/dL   Albumin 4.1 3.5 - 4.8 g/dL   Bilirubin Total 0.5 0.0 - 1.2 mg/dL   Bilirubin, Direct 0.13 0.00 - 0.40 mg/dL   Alkaline Phosphatase 62 39 - 117 IU/L   AST 20 0 - 40 IU/L   ALT 11 0 - 32 IU/L          Review of Systems No headache, no major weight loss or weight gain, no chest pain no back pain abdominal pain no change in bowel habits complete ROS otherwise negative     Objective:   Physical Exam Alert and oriented, vitals  reviewed and stable, NAD ENT-TM's and ext canals WNL bilat via otoscopic exam Soft palate, tonsils and post pharynx WNL via oropharyngeal exam Neck-symmetric, no masses; thyroid nonpalpable and nontender Pulmonary-no tachypnea or accessory muscle use; Clear without wheezes via auscultation Card--no abnrml murmurs, rhythm reg and rate WNL Carotid pulses symmetric, without bruits        Assessment & Plan:  Impression 1 hyperlipidemia clinically stable discussed to maintain diet discussed discussed  2.  Stage III chronic kidney disease.  Glomerular filtration rate has been dropping over the past several years.  Discussed the great length.  Numerous questions answered.  Patient wishes to hold off on visits to nephrologist = which I think is reasonable with current GFR in the upper 40s educational information given  Greater than 50% of this 25 minute face to face visit was spent in counseling and discussion and coordination of care regarding the above diagnosis/diagnosies Follow-up in 6 months.  Blood work diet exercise discussed medication

## 2018-04-17 NOTE — Patient Instructions (Signed)
Chronic Kidney Disease, Adult Chronic kidney disease (CKD) happens when the kidneys are damaged during a time of 3 or more months. The kidneys are two organs that do many important jobs in the body. These jobs include:  Removing wastes and extra fluids from the blood.  Making hormones that maintain the amount of fluid in your tissues and blood vessels.  Making sure that the body has the right amount of fluids and chemicals.  Most of the time, this condition does not go away, but it can usually be controlled. Steps must be taken to slow down the kidney damage or stop it from getting worse. Otherwise, the kidneys may stop working. Follow these instructions at home:  Follow your diet as told by your doctor. You may need to avoid alcohol, salty foods (sodium), and foods that are high in potassium, calcium, and protein.  Take over-the-counter and prescription medicines only as told by your doctor. Do not take any new medicines unless your doctor says you can do that. These include vitamins and minerals. ? Medicines and nutritional supplements can make kidney damage worse. ? Your doctor may need to change how much medicine you take.  Do not use any tobacco products. These include cigarettes, chewing tobacco, and e-cigarettes. If you need help quitting, ask your doctor.  Keep all follow-up visits as told by your doctor. This is important.  Check your blood pressure. Tell your doctor if there are changes to your blood pressure.  Get to a healthy weight. Stay at that weight. If you need help with this, ask your doctor.  Start or continue an exercise plan. Try to exercise at least 30 minutes a day, 5 days a week.  Stay up-to-date with your shots (immunizations) as told by your doctor. Contact a doctor if:  Your symptoms get worse.  You have new symptoms. Get help right away if:  You have symptoms of end-stage kidney disease. These include: ? Headaches. ? Skin that is darker or lighter  than normal. ? Numbness in your hands or feet. ? Easy bruising. ? Having hiccups often. ? Chest pain. ? Shortness of breath. ? Stopping of menstrual periods in women.  You have a fever.  You are making very little pee (urine).  You have pain or bleeding when you pee (urinate). This information is not intended to replace advice given to you by your health care provider. Make sure you discuss any questions you have with your health care provider. Document Released: 10/04/2009 Document Revised: 12/16/2015 Document Reviewed: 03/08/2012 Elsevier Interactive Patient Education  2017 [employer]  

## 2018-04-19 ENCOUNTER — Other Ambulatory Visit: Payer: Self-pay

## 2018-04-19 ENCOUNTER — Telehealth: Payer: Self-pay | Admitting: Family Medicine

## 2018-04-19 NOTE — Telephone Encounter (Signed)
Pt contacted office and stated that she was looking over paperwork from office visit on 04/17/18  and noticed that her medications were not correct. Pt states she is not taking Fosamax,Albuterol, or the Tessalon. Pt states she is getting ready to have her insurance review and is not sure if they look over medication. Can I take the medications she is not taking off her medication list? Please advise. Thanks.

## 2018-04-19 NOTE — Telephone Encounter (Signed)
Let pt know we already have it delineated in tehe computer record asw not taking, but we will completely remove it

## 2018-04-19 NOTE — Telephone Encounter (Signed)
Medications taken off Epic med list and patient is aware.

## 2018-05-10 ENCOUNTER — Other Ambulatory Visit: Payer: Self-pay | Admitting: Family Medicine

## 2018-10-10 DIAGNOSIS — Z79899 Other long term (current) drug therapy: Secondary | ICD-10-CM | POA: Diagnosis not present

## 2018-10-10 DIAGNOSIS — E785 Hyperlipidemia, unspecified: Secondary | ICD-10-CM | POA: Diagnosis not present

## 2018-10-11 ENCOUNTER — Other Ambulatory Visit: Payer: Self-pay

## 2018-10-11 LAB — BASIC METABOLIC PANEL
BUN / CREAT RATIO: 23 (ref 12–28)
BUN: 20 mg/dL (ref 8–27)
CHLORIDE: 104 mmol/L (ref 96–106)
CO2: 24 mmol/L (ref 20–29)
Calcium: 9.4 mg/dL (ref 8.7–10.3)
Creatinine, Ser: 0.86 mg/dL (ref 0.57–1.00)
GFR calc Af Amer: 74 mL/min/{1.73_m2} (ref >59)
GFR calc non Af Amer: 64 mL/min/{1.73_m2} (ref >59)
GLUCOSE: 93 mg/dL (ref 65–99)
Potassium: 4.5 mmol/L (ref 3.5–5.2)
SODIUM: 142 mmol/L (ref 134–144)

## 2018-10-11 LAB — HEPATIC FUNCTION PANEL
ALK PHOS: 52 IU/L (ref 39–117)
ALT: 16 IU/L (ref 0–32)
AST: 20 IU/L (ref 0–40)
Albumin: 4.3 g/dL (ref 3.7–4.7)
BILIRUBIN, DIRECT: 0.08 mg/dL (ref 0.00–0.40)
Bilirubin Total: 0.3 mg/dL (ref 0.0–1.2)
Total Protein: 6.9 g/dL (ref 6.0–8.5)

## 2018-10-11 LAB — LIPID PANEL
CHOLESTEROL TOTAL: 182 mg/dL (ref 100–199)
Chol/HDL Ratio: 2.9 ratio (ref 0.0–4.4)
HDL: 63 mg/dL (ref >39)
LDL Calculated: 98 mg/dL (ref 0–99)
TRIGLYCERIDES: 107 mg/dL (ref 0–149)
VLDL Cholesterol Cal: 21 mg/dL (ref 5–40)

## 2018-10-11 MED ORDER — SIMVASTATIN 20 MG PO TABS: ORAL_TABLET | ORAL | 1 refills | Status: DC

## 2018-10-14 ENCOUNTER — Ambulatory Visit: Payer: PPO | Admitting: Family Medicine

## 2018-10-18 ENCOUNTER — Ambulatory Visit: Payer: PPO | Admitting: Family Medicine

## 2018-12-23 ENCOUNTER — Other Ambulatory Visit (HOSPITAL_COMMUNITY): Payer: Self-pay | Admitting: Family Medicine

## 2018-12-23 DIAGNOSIS — Z1231 Encounter for screening mammogram for malignant neoplasm of breast: Secondary | ICD-10-CM

## 2018-12-30 ENCOUNTER — Other Ambulatory Visit: Payer: Self-pay

## 2018-12-30 ENCOUNTER — Ambulatory Visit (HOSPITAL_COMMUNITY)
Admission: RE | Admit: 2018-12-30 | Discharge: 2018-12-30 | Disposition: A | Discharge status: Home / Self Care | Payer: PPO | Source: Ambulatory Visit | Attending: Family Medicine | Admitting: Family Medicine

## 2018-12-30 ENCOUNTER — Encounter (HOSPITAL_COMMUNITY): Payer: Self-pay

## 2018-12-30 DIAGNOSIS — Z1231 Encounter for screening mammogram for malignant neoplasm of breast: Secondary | ICD-10-CM | POA: Diagnosis not present

## 2018-12-31 DIAGNOSIS — H52221 Regular astigmatism, right eye: Secondary | ICD-10-CM | POA: Diagnosis not present

## 2018-12-31 DIAGNOSIS — H5213 Myopia, bilateral: Secondary | ICD-10-CM | POA: Diagnosis not present

## 2018-12-31 DIAGNOSIS — Z961 Presence of intraocular lens: Secondary | ICD-10-CM | POA: Diagnosis not present

## 2019-02-19 ENCOUNTER — Other Ambulatory Visit: Payer: Self-pay

## 2019-03-05 IMAGING — MG DIGITAL SCREENING BILATERAL MAMMOGRAM WITH TOMO AND CAD
8 series · 8 of 24 positions shown · non-contrast
Comparison: Previous exam(s).

ACR Breast Density Category a: The breast tissue is almost entirely
fatty.

CLINICAL DATA: Screening.

EXAM:
DIGITAL SCREENING BILATERAL MAMMOGRAM WITH TOMO AND CAD

[R CC]
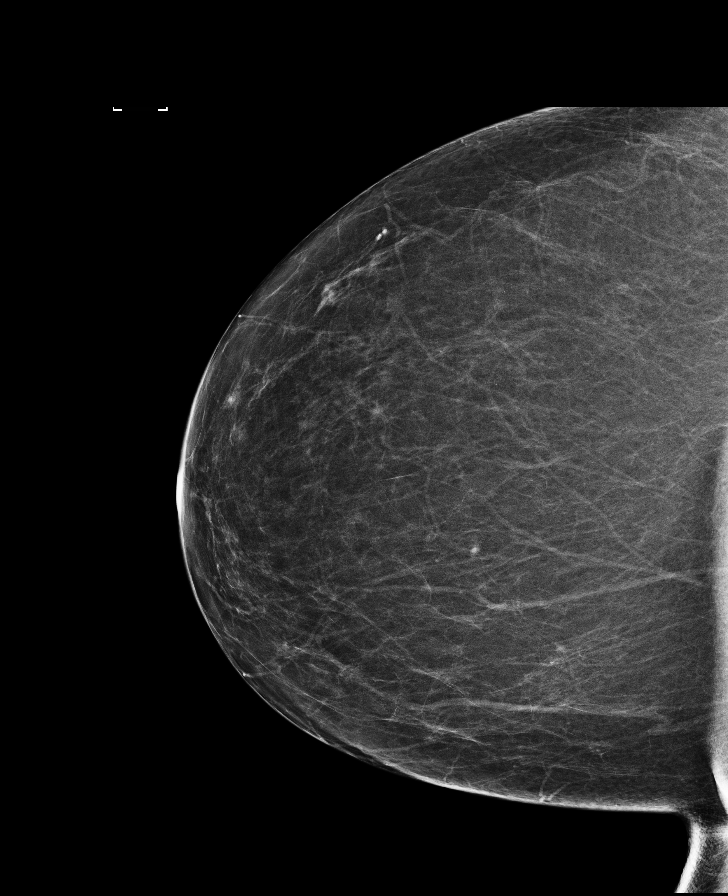

[L CC]
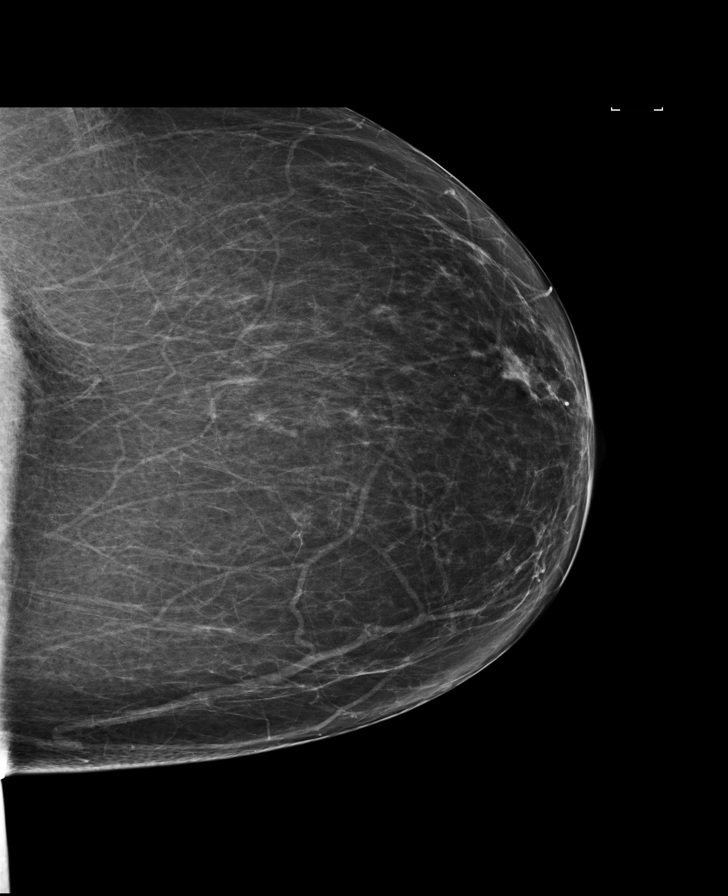

[L MLO]
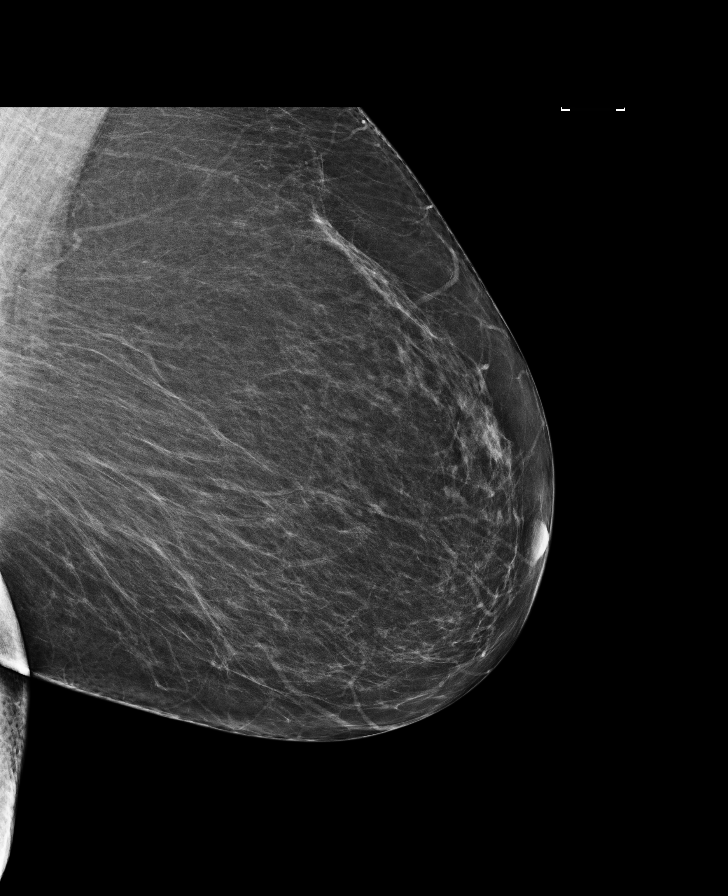

[R MLO]
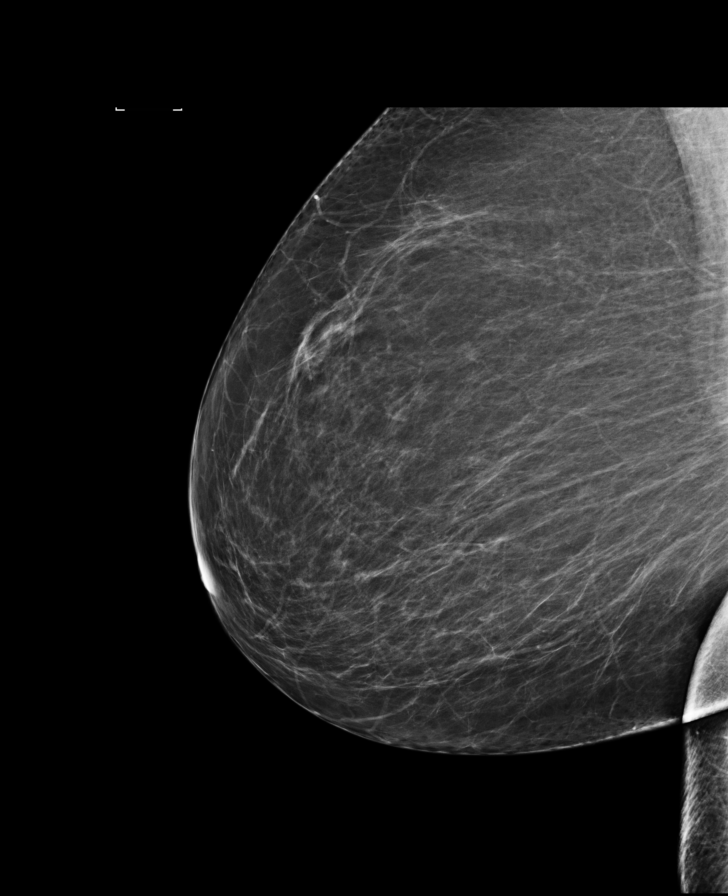

[L CC tomo · tomo slice 40/79.0]
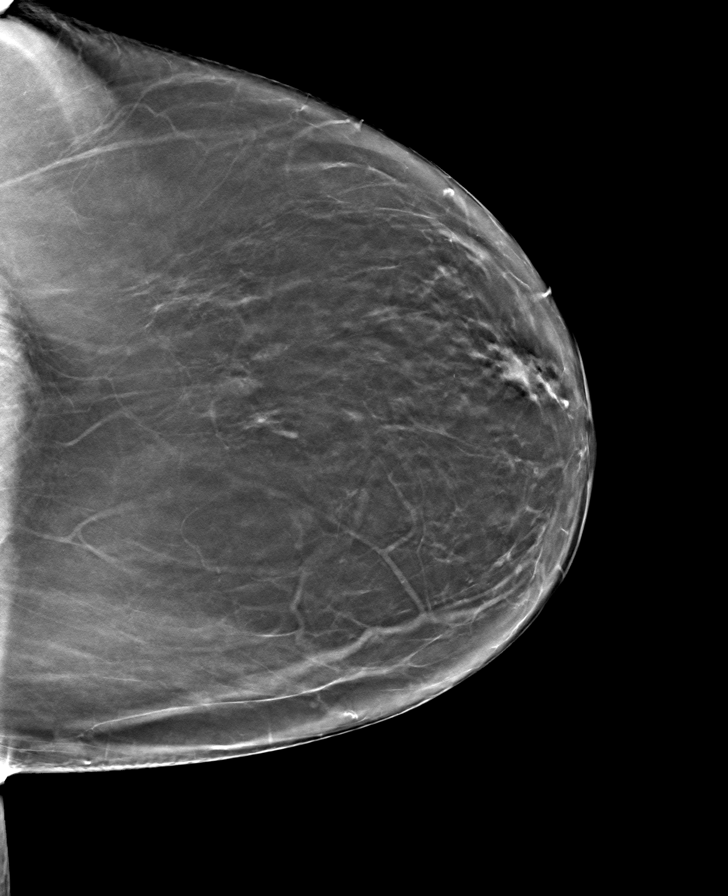

[R CC tomo · tomo slice 42/83.0]
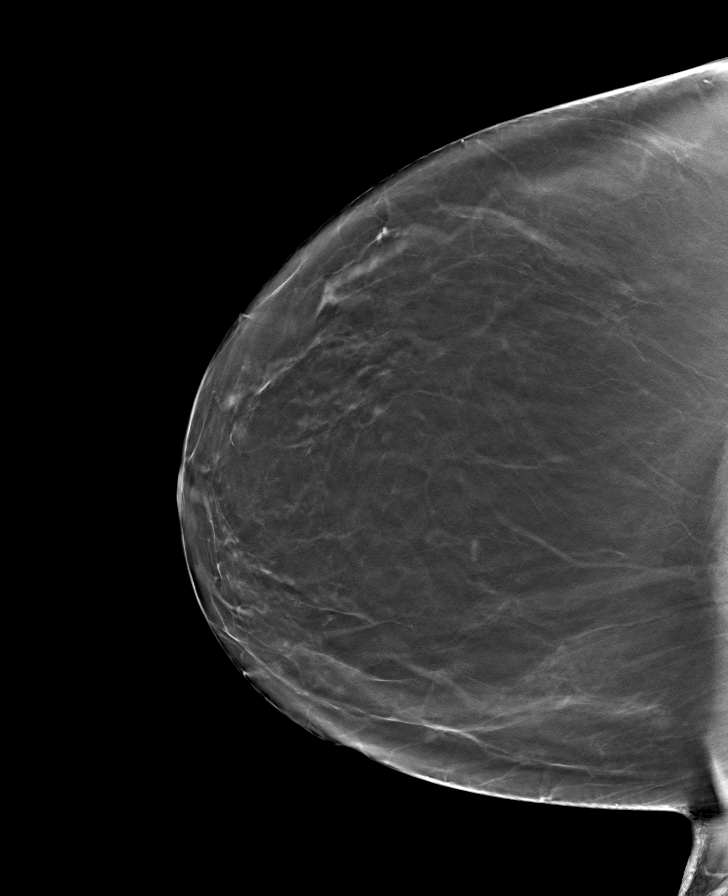

[L MLO tomo · tomo slice 43/85.0]
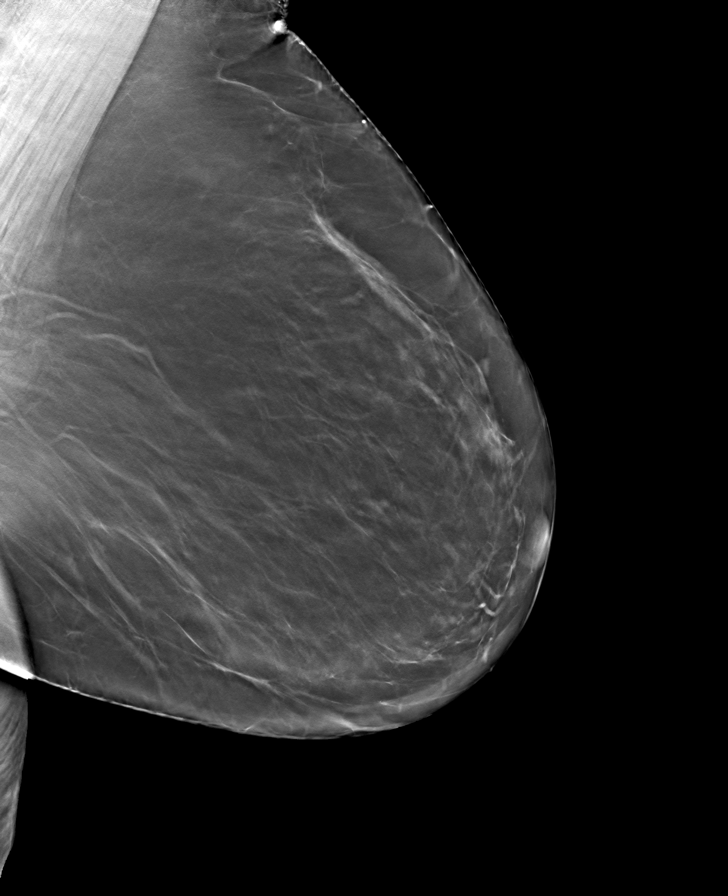

[R MLO tomo · tomo slice 45/88.0]
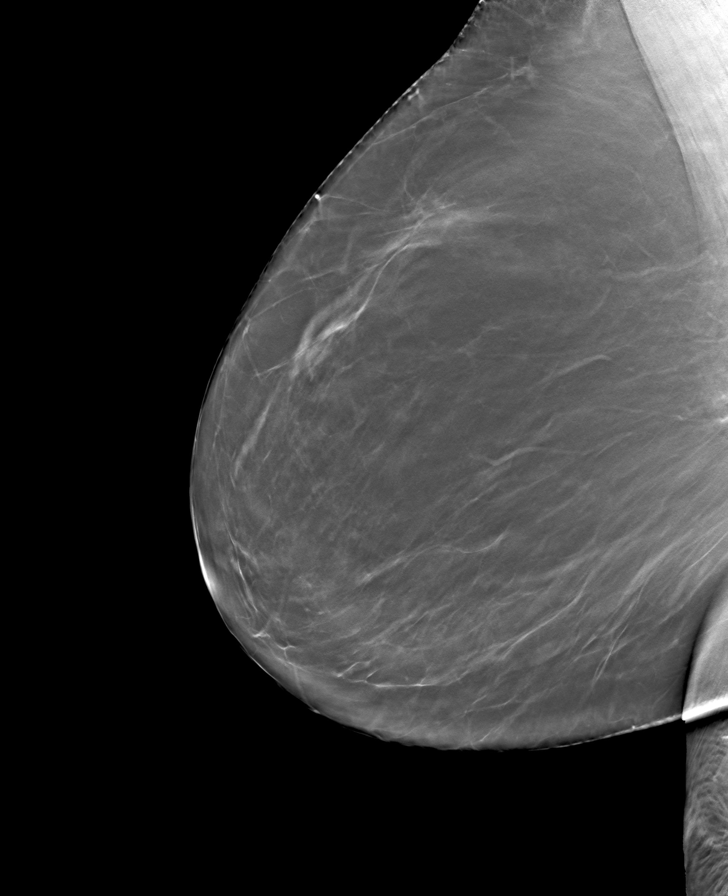

[8 of 24 positions shown; findings below may reference images not displayed]

FINDINGS: There are no findings suspicious for malignancy. Images were
processed with CAD.
IMPRESSION: No mammographic evidence of malignancy. A result letter of this
screening mammogram will be mailed directly to the patient.

RECOMMENDATION:
Screening mammogram in one year. (Code:8Y-[id number])

BI-RADS CATEGORY  1: Negative.

## 2019-03-26 DIAGNOSIS — L820 Inflamed seborrheic keratosis: Secondary | ICD-10-CM | POA: Diagnosis not present

## 2019-03-26 DIAGNOSIS — Z85828 Personal history of other malignant neoplasm of skin: Secondary | ICD-10-CM | POA: Diagnosis not present

## 2019-03-26 DIAGNOSIS — L814 Other melanin hyperpigmentation: Secondary | ICD-10-CM | POA: Diagnosis not present

## 2019-03-26 DIAGNOSIS — L578 Other skin changes due to chronic exposure to nonionizing radiation: Secondary | ICD-10-CM | POA: Diagnosis not present

## 2019-03-26 DIAGNOSIS — L821 Other seborrheic keratosis: Secondary | ICD-10-CM | POA: Diagnosis not present

## 2019-03-26 DIAGNOSIS — L570 Actinic keratosis: Secondary | ICD-10-CM | POA: Diagnosis not present

## 2019-04-03 ENCOUNTER — Telehealth: Payer: Self-pay | Admitting: Family Medicine

## 2019-04-03 DIAGNOSIS — Z79899 Other long term (current) drug therapy: Secondary | ICD-10-CM

## 2019-04-03 DIAGNOSIS — E785 Hyperlipidemia, unspecified: Secondary | ICD-10-CM

## 2019-04-03 NOTE — Telephone Encounter (Signed)
Last labs 09/2018: Lipid, Liver and Met 7 

## 2019-04-03 NOTE — Telephone Encounter (Signed)
Blood work ordered in Epic. Patient notified. 

## 2019-04-03 NOTE — Telephone Encounter (Signed)
Sure rep same

## 2019-04-03 NOTE — Telephone Encounter (Signed)
Patient has appt on 04-23-19 for medication refills and wants to have bloodwork done in advance at [facility].

## 2019-04-09 DIAGNOSIS — Z79899 Other long term (current) drug therapy: Secondary | ICD-10-CM | POA: Diagnosis not present

## 2019-04-09 DIAGNOSIS — E785 Hyperlipidemia, unspecified: Secondary | ICD-10-CM | POA: Diagnosis not present

## 2019-04-10 LAB — BASIC METABOLIC PANEL
BUN/Creatinine Ratio: 23 (ref 12–28)
BUN: 22 mg/dL (ref 8–27)
CO2: 26 mmol/L (ref 20–29)
Calcium: 9.5 mg/dL (ref 8.7–10.3)
Chloride: 104 mmol/L (ref 96–106)
Creatinine, Ser: 0.96 mg/dL (ref 0.57–1.00)
GFR calc Af Amer: 65 mL/min/{1.73_m2} (ref >59)
GFR calc non Af Amer: 56 mL/min/{1.73_m2} — ABNORMAL LOW (ref >59)
Glucose: 84 mg/dL (ref 65–99)
Potassium: 4.9 mmol/L (ref 3.5–5.2)
Sodium: 142 mmol/L (ref 134–144)

## 2019-04-10 LAB — HEPATIC FUNCTION PANEL
ALT: 12 IU/L (ref 0–32)
AST: 18 IU/L (ref 0–40)
Albumin: 4.8 g/dL — ABNORMAL HIGH (ref 3.7–4.7)
Alkaline Phosphatase: 53 IU/L (ref 39–117)
Bilirubin Total: 0.4 mg/dL (ref 0.0–1.2)
Bilirubin, Direct: 0.13 mg/dL (ref 0.00–0.40)
Total Protein: 7.3 g/dL (ref 6.0–8.5)

## 2019-04-10 LAB — LIPID PANEL
Chol/HDL Ratio: 2.8 ratio (ref 0.0–4.4)
Cholesterol, Total: 186 mg/dL (ref 100–199)
HDL: 67 mg/dL (ref >39)
LDL Chol Calc (NIH): 94 mg/dL (ref 0–99)
Triglycerides: 144 mg/dL (ref 0–149)
VLDL Cholesterol Cal: 25 mg/dL (ref 5–40)

## 2019-04-23 ENCOUNTER — Encounter: Payer: Self-pay | Admitting: Family Medicine

## 2019-04-23 ENCOUNTER — Ambulatory Visit (INDEPENDENT_AMBULATORY_CARE_PROVIDER_SITE_OTHER): Payer: PPO | Admitting: Family Medicine

## 2019-04-23 ENCOUNTER — Other Ambulatory Visit: Payer: Self-pay

## 2019-04-23 DIAGNOSIS — N183 Chronic kidney disease, stage 3 unspecified: Secondary | ICD-10-CM

## 2019-04-23 DIAGNOSIS — E785 Hyperlipidemia, unspecified: Secondary | ICD-10-CM

## 2019-04-23 DIAGNOSIS — F4321 Adjustment disorder with depressed mood: Secondary | ICD-10-CM

## 2019-04-23 MED ORDER — SIMVASTATIN 20 MG PO TABS: ORAL_TABLET | ORAL | 1 refills | Status: DC

## 2019-04-23 NOTE — Progress Notes (Signed)
Subjective:    Patient ID: Marisa Livingston, female    DOB: 1935-07-29, 83 y.o.   MRN: [id number]  Hyperlipidemia This is a chronic problem. The current episode started more than 1 year ago. Treatments tried: zocor 10 mg. There are no compliance problems.  Risk factors for coronary artery disease include dyslipidemia and post-menopausal.    Results for orders placed or performed in visit on 04/03/19  Lipid panel  Result Value Ref Range   Cholesterol, Total 186 100 - 199 mg/dL   Triglycerides 144 0 - 149 mg/dL   HDL 67 >39 mg/dL   VLDL Cholesterol Cal 25 5 - 40 mg/dL   LDL Chol Calc (NIH) 94 0 - 99 mg/dL   Chol/HDL Ratio 2.8 0.0 - 4.4 ratio  Hepatic function panel  Result Value Ref Range   Total Protein 7.3 6.0 - 8.5 g/dL   Albumin 4.8 (H) 3.7 - 4.7 g/dL   Bilirubin Total 0.4 0.0 - 1.2 mg/dL   Bilirubin, Direct 0.13 0.00 - 0.40 mg/dL   Alkaline Phosphatase 53 39 - 117 IU/L   AST 18 0 - 40 IU/L   ALT 12 0 - 32 IU/L  Basic metabolic panel  Result Value Ref Range   Glucose 84 65 - 99 mg/dL   BUN 22 8 - 27 mg/dL   Creatinine, Ser 0.96 0.57 - 1.00 mg/dL   GFR calc non Af Amer 56 (L) >59 mL/min/1.73   GFR calc Af Amer 65 >59 mL/min/1.73   BUN/Creatinine Ratio 23 12 - 28   Sodium 142 134 - 144 mmol/L   Potassium 4.9 3.5 - 5.2 mmol/L   Chloride 104 96 - 106 mmol/L   CO2 26 20 - 29 mmol/L   Calcium 9.5 8.7 - 10.3 mg/dL     Review of Systems  Virtual Visit via Video Note  I connected with Marisa Livingston on 04/23/19 at 10:00 AM EDT by a video enabled telemedicine application and verified that I am speaking with the correct person using two identifiers.  Location: Patient: home Provider: office   I discussed the limitations of evaluation and management by telemedicine and the availability of in person appointments. The patient expressed understanding and agreed to proceed.  History of Present Illness:    Observations/Objective:   Assessment and Plan:   Follow Up  Instructions:    I discussed the assessment and treatment plan with the patient. The patient was provided an opportunity to ask questions and all were answered. The patient agreed with the plan and demonstrated an understanding of the instructions.   The patient was advised to call back or seek an in-person evaluation if the symptoms worsen or if the condition fails to improve as anticipated.  I provided 25 minutes of non-face-to-face time during this encounter.   Patient continues to take lipid medication regularly. No obvious side effects from it. Generally does not miss a dose. Prior blood work results are reviewed with patient. Patient continues to work on fat intake in diet  No headache, no major weight loss or weight gain, no chest pain no back pain abdominal pain no change in bowel habits complete ROS otherwise negative      Objective:   Physical Exam   Virtual     Assessment & Plan:  Impression 1 hyperlipidemia.  Control good discussed to maintain same meds.  Compliance discussed diet and exercise discussed and encouraged  Patient to get flu shot tomorrow  Blood work reviewed all excellent renal  function has returned to completely normal  Patient dealing with grief issues.  Lost her husband in the past year.  Now just lost her brother in the past week due to illness.  Discussion held regarding supportive.  Patient has excellent social support  Greater than 50% of this 25 minute face to face visit was spent in counseling and discussion and coordination of care regarding the above diagnosis/diagnosies

## 2019-04-24 ENCOUNTER — Other Ambulatory Visit (INDEPENDENT_AMBULATORY_CARE_PROVIDER_SITE_OTHER): Payer: PPO | Admitting: *Deleted

## 2019-04-24 ENCOUNTER — Other Ambulatory Visit: Payer: Self-pay

## 2019-04-24 DIAGNOSIS — Z23 Encounter for immunization: Secondary | ICD-10-CM

## 2019-07-02 ENCOUNTER — Encounter: Payer: Self-pay | Admitting: Family Medicine

## 2019-07-02 ENCOUNTER — Ambulatory Visit (INDEPENDENT_AMBULATORY_CARE_PROVIDER_SITE_OTHER): Payer: PPO | Admitting: Family Medicine

## 2019-07-02 ENCOUNTER — Other Ambulatory Visit: Payer: Self-pay

## 2019-07-02 VITALS — BP 138/92 | Temp 94.7°F | Wt 176.8 lb

## 2019-07-02 DIAGNOSIS — R519 Headache, unspecified: Secondary | ICD-10-CM | POA: Diagnosis not present

## 2019-07-02 DIAGNOSIS — R000 Tachycardia, unspecified: Secondary | ICD-10-CM | POA: Diagnosis not present

## 2019-07-02 DIAGNOSIS — S0083XA Contusion of other part of head, initial encounter: Secondary | ICD-10-CM

## 2019-07-02 NOTE — Progress Notes (Signed)
   Subjective:    Patient ID: Marisa Livingston, female    DOB: April 22, 1936, 83 y.o.   MRN: [id number]  HPI Pt fell in home on Saturday night. Pt hit right side of face on table and now has a black eye. No headache, no blurred vision, no facial pain. No treatments tried at this time.   Pt felt transiently light headed after getting up  Patient turned in her kitchen.  Felt unsteady.  She had been on her feet for a few moments with her unsteadiness she slipped and fell.  Struck her side of her temple on a table.  Is developed swelling and pain  Concerned about intermittent rapid heart rate.  This will occur after walking across through the room in the house.  Or after doing some physical activity.  No chest pain  On further history patient's exercise level has been minimal of late  Review of Systems No chest pain no abdominal pain no change in bowel habits    Objective:   Physical Exam  Alert and oriented, vitals reviewed and stable, NAD ENT-TM's and ext canals WNL bilat via otoscopic exam Soft palate, tonsils and post pharynx WNL via oropharyngeal exam Neck-symmetric, no masses; thyroid nonpalpable and nontender Pulmonary-no tachypnea or accessory muscle use; Clear without wheezes via auscultation Card--no abnrml murmurs, rhythm reg and rate WNL Carotid pulses symmetric, without bruits Mild initial tachycardia on auscultation.  110 bpm.  Near the end of the exam had dropped down to 85 bpm Right lateral temple tender region at site of contusion periocular hematoma resolving evident   EKG normal sinus rhythm, no tachycardia      Assessment & Plan:  Impression 1 facial injury with contusion and hematoma improving  2.  Tachycardia perceived likely plain sinus tachycardia.  Primarily due to exercise intolerance exercise encouraged if patient ever wants to see cardiologist we can set up  Numerous questions answered  Would recommend holding off on further work-up at this time

## 2019-07-31 ENCOUNTER — Encounter: Payer: Self-pay | Admitting: Family Medicine

## 2019-08-05 ENCOUNTER — Telehealth: Payer: Self-pay | Admitting: Family Medicine

## 2019-08-05 NOTE — Telephone Encounter (Signed)
Should she take the vaccine?

## 2019-08-05 NOTE — Telephone Encounter (Signed)
Called pt and she wants to know if she should take covid vaccine and I told yes dr Richardson Landry highly reccommends she get covid vaccine and pt states she already has appt and will get it

## 2019-08-28 ENCOUNTER — Encounter: Payer: Self-pay | Admitting: Family Medicine

## 2019-11-17 ENCOUNTER — Other Ambulatory Visit: Payer: Self-pay | Admitting: Family Medicine

## 2019-12-05 ENCOUNTER — Telehealth: Payer: Self-pay | Admitting: Family Medicine

## 2019-12-05 DIAGNOSIS — E785 Hyperlipidemia, unspecified: Secondary | ICD-10-CM

## 2019-12-05 DIAGNOSIS — Z79899 Other long term (current) drug therapy: Secondary | ICD-10-CM

## 2019-12-05 NOTE — Telephone Encounter (Signed)
same

## 2019-12-05 NOTE — Telephone Encounter (Signed)
Patient is requesting labs for 6 month follow up

## 2019-12-05 NOTE — Telephone Encounter (Signed)
Blood work ordered in Epic. Patient notified. 

## 2019-12-05 NOTE — Telephone Encounter (Signed)
Last labs 03/2019- Lipid, Liver, Met 7

## 2019-12-08 DIAGNOSIS — Z79899 Other long term (current) drug therapy: Secondary | ICD-10-CM | POA: Diagnosis not present

## 2019-12-08 DIAGNOSIS — E785 Hyperlipidemia, unspecified: Secondary | ICD-10-CM | POA: Diagnosis not present

## 2019-12-09 LAB — BASIC METABOLIC PANEL
BUN/Creatinine Ratio: 19 (ref 12–28)
BUN: 21 mg/dL (ref 8–27)
CO2: 23 mmol/L (ref 20–29)
Calcium: 10.4 mg/dL — ABNORMAL HIGH (ref 8.7–10.3)
Chloride: 104 mmol/L (ref 96–106)
Creatinine, Ser: 1.1 mg/dL — ABNORMAL HIGH (ref 0.57–1.00)
GFR calc Af Amer: 54 mL/min/{1.73_m2} — ABNORMAL LOW (ref >59)
GFR calc non Af Amer: 47 mL/min/{1.73_m2} — ABNORMAL LOW (ref >59)
Glucose: 111 mg/dL — ABNORMAL HIGH (ref 65–99)
Potassium: 5.7 mmol/L — ABNORMAL HIGH (ref 3.5–5.2)
Sodium: 142 mmol/L (ref 134–144)

## 2019-12-09 LAB — HEPATIC FUNCTION PANEL
ALT: 12 IU/L (ref 0–32)
AST: 19 IU/L (ref 0–40)
Albumin: 4.3 g/dL (ref 3.6–4.6)
Alkaline Phosphatase: 65 IU/L (ref 48–121)
Bilirubin Total: 0.3 mg/dL (ref 0.0–1.2)
Bilirubin, Direct: 0.11 mg/dL (ref 0.00–0.40)
Total Protein: 6.9 g/dL (ref 6.0–8.5)

## 2019-12-09 LAB — LIPID PANEL
Chol/HDL Ratio: 2.5 ratio (ref 0.0–4.4)
Cholesterol, Total: 170 mg/dL (ref 100–199)
HDL: 67 mg/dL (ref >39)
LDL Chol Calc (NIH): 84 mg/dL (ref 0–99)
Triglycerides: 106 mg/dL (ref 0–149)
VLDL Cholesterol Cal: 19 mg/dL (ref 5–40)

## 2019-12-11 ENCOUNTER — Telehealth: Payer: Self-pay | Admitting: *Deleted

## 2019-12-11 ENCOUNTER — Other Ambulatory Visit: Payer: Self-pay

## 2019-12-11 ENCOUNTER — Telehealth (INDEPENDENT_AMBULATORY_CARE_PROVIDER_SITE_OTHER): Payer: PPO | Admitting: Family Medicine

## 2019-12-11 DIAGNOSIS — E785 Hyperlipidemia, unspecified: Secondary | ICD-10-CM

## 2019-12-11 MED ORDER — SIMVASTATIN 20 MG PO TABS: ORAL_TABLET | ORAL | 1 refills | Status: DC

## 2019-12-11 NOTE — Telephone Encounter (Signed)
[name], villarruel are scheduled for a virtual visit with your provider today.    Just as we do with appointments in the office, we must obtain your consent to participate.  Your consent will be active for this visit and any virtual visit you may have with one of our providers in the next 365 days.    If you have a MyChart account, I can also send a copy of this consent to you electronically.  All virtual visits are billed to your insurance company just like a traditional visit in the office.  As this is a virtual visit, video technology does not allow for your provider to perform a traditional examination.  This may limit your provider's ability to fully assess your condition.  If your provider identifies any concerns that need to be evaluated in person or the need to arrange testing such as labs, EKG, etc, we will make arrangements to do so.    Although advances in technology are sophisticated, we cannot ensure that it will always work on either your end or our end.  If the connection with a video visit is poor, we may have to switch to a telephone visit.  With either a video or telephone visit, we are not always able to ensure that we have a secure connection.   I need to obtain your verbal consent now.   Are you willing to proceed with your visit today?   Marisa Livingston has provided verbal consent on 12/11/2019 for a virtual visit (video or telephone).   12/11/2019  10:28 AM

## 2019-12-11 NOTE — Progress Notes (Signed)
   Subjective:  Audio  Patient ID: Marisa Livingston, female    DOB: January 15, 1936, 84 y.o.   MRN: [id number]  Hyperlipidemia This is a chronic problem. There are no compliance problems (takes med every, eats healthy, walks for exercise).    Pt states no concerns today.   Virtual Visit via Telephone Note  I connected with Marisa Livingston on 12/11/19 at 10:00 AM EDT by telephone and verified that I am speaking with the correct person using two identifiers.  Location: Patient: home Provider: office   I discussed the limitations, risks, security and privacy concerns of performing an evaluation and management service by telephone and the availability of in person appointments. I also discussed with the patient that there may be a patient responsible charge related to this service. The patient expressed understanding and agreed to proceed.   History of Present Illness:    Observations/Objective:   Assessment and Plan:   Follow Up Instructions:    I discussed the assessment and treatment plan with the patient. The patient was provided an opportunity to ask questions and all were answered. The patient agreed with the plan and demonstrated an understanding of the instructions.   The patient was advised to call back or seek an in-person evaluation if the symptoms worsen or if the condition fails to improve as anticipated.  I provided 22 minutes of non-face-to-face time during this encounter.    Patient continues to take lipid medication regularly. No obvious side effects from it. Generally does not miss a dose. Prior blood work results are reviewed with patient. Patient continues to work on fat intake in diet    Review of Systems No headache no chest pain no shortness of breath    Objective:   Physical Exam  Virtual      Assessment & Plan:  Impression 1 hyperlipidemia.  Control good discussed maintain same meds  2.  Elevated fasting sugar.  Discussed.  This is the first  time this has occurred.  Patient to work on sugar intake and carb take  3.  Mild renal insufficiency with GFR as noted.  Discussed.  Hydration discussed.  4.  Patient has experienced the death of her 71-year marriage husband and understandably still suffering impact from that.  Discussed.  Fortunately supportive family and church family  Follow-up in 82 months.  We will do lipid and liver and met 7 at that time to assess diet exercise discussed

## 2019-12-23 NOTE — Telephone Encounter (Signed)
Error

## 2020-01-05 DIAGNOSIS — H5213 Myopia, bilateral: Secondary | ICD-10-CM | POA: Diagnosis not present

## 2020-01-05 DIAGNOSIS — Z961 Presence of intraocular lens: Secondary | ICD-10-CM | POA: Diagnosis not present

## 2020-01-13 ENCOUNTER — Other Ambulatory Visit (HOSPITAL_COMMUNITY): Payer: Self-pay | Admitting: Family Medicine

## 2020-01-13 DIAGNOSIS — Z1231 Encounter for screening mammogram for malignant neoplasm of breast: Secondary | ICD-10-CM

## 2020-01-29 ENCOUNTER — Other Ambulatory Visit: Payer: Self-pay

## 2020-01-29 ENCOUNTER — Ambulatory Visit (HOSPITAL_COMMUNITY)
Admission: RE | Admit: 2020-01-29 | Discharge: 2020-01-29 | Disposition: A | Discharge status: Home / Self Care | Payer: PPO | Source: Ambulatory Visit | Attending: Family Medicine | Admitting: Family Medicine

## 2020-01-29 DIAGNOSIS — Z1231 Encounter for screening mammogram for malignant neoplasm of breast: Secondary | ICD-10-CM | POA: Insufficient documentation

## 2020-03-17 DIAGNOSIS — H9313 Tinnitus, bilateral: Secondary | ICD-10-CM | POA: Diagnosis not present

## 2020-03-17 DIAGNOSIS — H903 Sensorineural hearing loss, bilateral: Secondary | ICD-10-CM | POA: Diagnosis not present

## 2020-03-30 DIAGNOSIS — L820 Inflamed seborrheic keratosis: Secondary | ICD-10-CM | POA: Diagnosis not present

## 2020-03-30 DIAGNOSIS — D225 Melanocytic nevi of trunk: Secondary | ICD-10-CM | POA: Diagnosis not present

## 2020-03-30 DIAGNOSIS — L814 Other melanin hyperpigmentation: Secondary | ICD-10-CM | POA: Diagnosis not present

## 2020-03-30 DIAGNOSIS — L821 Other seborrheic keratosis: Secondary | ICD-10-CM | POA: Diagnosis not present

## 2020-03-30 DIAGNOSIS — Z85828 Personal history of other malignant neoplasm of skin: Secondary | ICD-10-CM | POA: Diagnosis not present

## 2020-03-30 DIAGNOSIS — D1801 Hemangioma of skin and subcutaneous tissue: Secondary | ICD-10-CM | POA: Diagnosis not present

## 2020-03-30 DIAGNOSIS — L570 Actinic keratosis: Secondary | ICD-10-CM | POA: Diagnosis not present

## 2020-04-15 ENCOUNTER — Other Ambulatory Visit: Payer: Self-pay

## 2020-04-15 ENCOUNTER — Other Ambulatory Visit (INDEPENDENT_AMBULATORY_CARE_PROVIDER_SITE_OTHER): Payer: PPO | Admitting: *Deleted

## 2020-04-15 DIAGNOSIS — Z23 Encounter for immunization: Secondary | ICD-10-CM | POA: Diagnosis not present

## 2020-05-19 ENCOUNTER — Telehealth: Payer: Self-pay

## 2020-05-19 DIAGNOSIS — E785 Hyperlipidemia, unspecified: Secondary | ICD-10-CM

## 2020-05-19 DIAGNOSIS — Z79899 Other long term (current) drug therapy: Secondary | ICD-10-CM

## 2020-05-19 NOTE — Telephone Encounter (Signed)
Last labs lipid, liver, bmp on 12/08/19. Please route back to pool since Im off this afternoon. thanks

## 2020-05-19 NOTE — Telephone Encounter (Signed)
Blood work ordered in Epic. Left message to return call to notify patient. 

## 2020-05-19 NOTE — Telephone Encounter (Signed)
Patient has appointment 11/17 and needing labs done

## 2020-05-20 NOTE — Telephone Encounter (Signed)
Patient notified

## 2020-06-01 DIAGNOSIS — Z79899 Other long term (current) drug therapy: Secondary | ICD-10-CM | POA: Diagnosis not present

## 2020-06-01 DIAGNOSIS — E785 Hyperlipidemia, unspecified: Secondary | ICD-10-CM | POA: Diagnosis not present

## 2020-06-02 LAB — COMPREHENSIVE METABOLIC PANEL
ALT: 19 IU/L (ref 0–32)
AST: 21 IU/L (ref 0–40)
Albumin/Globulin Ratio: 1.5 (ref 1.2–2.2)
Albumin: 4.7 g/dL — ABNORMAL HIGH (ref 3.6–4.6)
Alkaline Phosphatase: 77 IU/L (ref 44–121)
BUN/Creatinine Ratio: 15 (ref 12–28)
BUN: 15 mg/dL (ref 8–27)
Bilirubin Total: 0.5 mg/dL (ref 0.0–1.2)
CO2: 25 mmol/L (ref 20–29)
Calcium: 10.2 mg/dL (ref 8.7–10.3)
Chloride: 93 mmol/L — ABNORMAL LOW (ref 96–106)
Creatinine, Ser: 1.02 mg/dL — ABNORMAL HIGH (ref 0.57–1.00)
GFR calc Af Amer: 60 mL/min/{1.73_m2} (ref >59)
GFR calc non Af Amer: 52 mL/min/{1.73_m2} — ABNORMAL LOW (ref >59)
Globulin, Total: 3.1 g/dL (ref 1.5–4.5)
Glucose: 97 mg/dL (ref 65–99)
Potassium: 5.5 mmol/L — ABNORMAL HIGH (ref 3.5–5.2)
Sodium: 148 mmol/L — ABNORMAL HIGH (ref 134–144)
Total Protein: 7.8 g/dL (ref 6.0–8.5)

## 2020-06-02 LAB — CBC WITH DIFFERENTIAL/PLATELET
Basophils Absolute: 0.1 10*3/uL (ref 0.0–0.2)
Basos: 1 %
EOS (ABSOLUTE): 0.1 10*3/uL (ref 0.0–0.4)
Eos: 1 %
Hematocrit: 45.5 % (ref 34.0–46.6)
Hemoglobin: 14.7 g/dL (ref 11.1–15.9)
Immature Grans (Abs): 0 10*3/uL (ref 0.0–0.1)
Immature Granulocytes: 0 %
Lymphocytes Absolute: 2.2 10*3/uL (ref 0.7–3.1)
Lymphs: 30 %
MCH: 29.2 pg (ref 26.6–33.0)
MCHC: 32.3 g/dL (ref 31.5–35.7)
MCV: 91 fL (ref 79–97)
Monocytes Absolute: 0.6 10*3/uL (ref 0.1–0.9)
Monocytes: 9 %
Neutrophils Absolute: 4.4 10*3/uL (ref 1.4–7.0)
Neutrophils: 59 %
Platelets: 282 10*3/uL (ref 150–450)
RBC: 5.03 x10E6/uL (ref 3.77–5.28)
RDW: 13.1 % (ref 11.7–15.4)
WBC: 7.4 10*3/uL (ref 3.4–10.8)

## 2020-06-02 LAB — LIPID PANEL
Chol/HDL Ratio: 3.1 ratio (ref 0.0–4.4)
Cholesterol, Total: 204 mg/dL — ABNORMAL HIGH (ref 100–199)
HDL: 66 mg/dL (ref >39)
LDL Chol Calc (NIH): 110 mg/dL — ABNORMAL HIGH (ref 0–99)
Triglycerides: 164 mg/dL — ABNORMAL HIGH (ref 0–149)
VLDL Cholesterol Cal: 28 mg/dL (ref 5–40)

## 2020-06-09 ENCOUNTER — Telehealth: Payer: Self-pay

## 2020-06-09 ENCOUNTER — Ambulatory Visit (INDEPENDENT_AMBULATORY_CARE_PROVIDER_SITE_OTHER): Payer: PPO | Admitting: Family Medicine

## 2020-06-09 ENCOUNTER — Encounter: Payer: Self-pay | Admitting: Family Medicine

## 2020-06-09 ENCOUNTER — Other Ambulatory Visit: Payer: Self-pay

## 2020-06-09 VITALS — BP 118/82 | HR 80 | Temp 95.0°F | Ht 61.5 in | Wt 173.0 lb

## 2020-06-09 DIAGNOSIS — E785 Hyperlipidemia, unspecified: Secondary | ICD-10-CM | POA: Diagnosis not present

## 2020-06-09 DIAGNOSIS — E875 Hyperkalemia: Secondary | ICD-10-CM | POA: Diagnosis not present

## 2020-06-09 DIAGNOSIS — I447 Left bundle-branch block, unspecified: Secondary | ICD-10-CM | POA: Diagnosis not present

## 2020-06-09 DIAGNOSIS — R000 Tachycardia, unspecified: Secondary | ICD-10-CM | POA: Diagnosis not present

## 2020-06-09 NOTE — Progress Notes (Signed)
Patient ID: Marisa Livingston, female    DOB: Jun 27, 1936, 84 y.o.   MRN: [id number]   Chief Complaint  Patient presents with  . Hyperlipidemia   Subjective:    HPI Pt here for medication follow up and HLD. Pt is taking Simvastatin and is doing well.   If walking brisk walk,  Noticing slight elevated HR at 112 with walking. Noticing in last 6-15months noticing it was elevated. occ noticing some sob, but is moving around.  Some times sits down and rests. Denies chest pain, palpitations.   Only noticing her watch stating HR is elevated.    Had EKG in 1/21, and showing LBBB.  Had fall in 12/20, and then they looked into getting an EKG. HR today in office 106 bpm initially after sitting in chair resting, went down to 80.  Medical History Marisa Livingston has a past medical history of Allergic rhinitis, Hyperlipidemia, Insomnia, and Osteopenia.   Outpatient Encounter Medications as of 06/09/2020  Medication Sig  . Calcium Citrate (CITRACAL PO) Take by mouth.  . Cholecalciferol (VITAMIN D3 PO) Take 1 tablet by mouth daily.   . Loratadine (CLARITIN PO) Take by mouth.  . simvastatin (ZOCOR) 20 MG tablet Take one-half tablet daily   No facility-administered encounter medications on file as of 06/09/2020.     Review of Systems  Constitutional: Negative for chills and fever.  HENT: Negative for congestion, rhinorrhea and sore throat.   Respiratory: Negative for cough, shortness of breath and wheezing.   Cardiovascular: Negative for chest pain, palpitations and leg swelling.       +elevated HR with mild exertion.  Gastrointestinal: Negative for abdominal pain, diarrhea, nausea and vomiting.  Genitourinary: Negative for dysuria and frequency.  Musculoskeletal: Negative for arthralgias and back pain.  Skin: Negative for rash.  Neurological: Negative for dizziness, weakness and headaches.     Vitals BP 118/82   Pulse 80   Temp (!) 95 F (35 C)   Ht 5' 1.5" (1.562 m)   Wt 173 lb  (78.5 kg)   SpO2 93%   BMI 32.16 kg/m   Objective:   Physical Exam Vitals and nursing note reviewed.  Constitutional:      General: She is not in acute distress.    Appearance: Normal appearance. She is not ill-appearing.  HENT:     Head: Normocephalic and atraumatic.     Nose: Nose normal.     Mouth/Throat:     Mouth: Mucous membranes are moist.     Pharynx: Oropharynx is clear.  Eyes:     Extraocular Movements: Extraocular movements intact.     Conjunctiva/sclera: Conjunctivae normal.     Pupils: Pupils are equal, round, and reactive to light.  Cardiovascular:     Rate and Rhythm: Normal rate and regular rhythm.     Pulses: Normal pulses.     Heart sounds: Normal heart sounds.  Pulmonary:     Effort: Pulmonary effort is normal. No respiratory distress.     Breath sounds: Normal breath sounds. No wheezing, rhonchi or rales.  Musculoskeletal:        General: Normal range of motion.     Right lower leg: No edema.     Left lower leg: No edema.  Skin:    General: Skin is warm and dry.     Findings: No lesion or rash.  Neurological:     General: No focal deficit present.     Mental Status: She is alert and oriented to person,  place, and time.  Psychiatric:        Mood and Affect: Mood normal.        Behavior: Behavior normal.      Assessment and Plan   1. Hyperlipidemia LDL goal <130  2. Serum potassium elevated - Basic metabolic panel  3. Left bundle branch block (LBBB) on electrocardiogram - Ambulatory referral to Cardiology  4. Tachycardia - EKG 12-Lead - Ambulatory referral to Cardiology   Tachycardia-intermittent. Pt requesting [facility] in [place], for cardiology.  Will send referral. Also seen on EKG LBBB.   Repeated EKG today and stable, LBBB seen again.  Hyperkalemia- like lab error or hemolysis of blood sample.  Will recheck. No symptoms of vomiting, diarrhea, or palpitations. Not on any supplements and reviewed med list.   hld- stable,  at goal.  cont meds.

## 2020-06-09 NOTE — Telephone Encounter (Signed)
Pt was seen today by Dr Lovena Le and she was going to send Pt to heart Dr at [facility] pt wants to see Dr Rayann Heman   Pt call back [phone]

## 2020-06-09 NOTE — Telephone Encounter (Signed)
Referral updated

## 2020-06-10 ENCOUNTER — Ambulatory Visit: Payer: PPO | Attending: Internal Medicine

## 2020-06-10 DIAGNOSIS — Z23 Encounter for immunization: Secondary | ICD-10-CM

## 2020-06-10 LAB — BASIC METABOLIC PANEL
BUN/Creatinine Ratio: 20 (ref 12–28)
BUN: 20 mg/dL (ref 8–27)
CO2: 25 mmol/L (ref 20–29)
Calcium: 10.1 mg/dL (ref 8.7–10.3)
Chloride: 100 mmol/L (ref 96–106)
Creatinine, Ser: 1 mg/dL (ref 0.57–1.00)
GFR calc Af Amer: 61 mL/min/{1.73_m2} (ref >59)
GFR calc non Af Amer: 53 mL/min/{1.73_m2} — ABNORMAL LOW (ref >59)
Glucose: 96 mg/dL (ref 65–99)
Potassium: 5.4 mmol/L — ABNORMAL HIGH (ref 3.5–5.2)
Sodium: 140 mmol/L (ref 134–144)

## 2020-06-10 NOTE — Progress Notes (Signed)
   Covid-19 Vaccination Clinic  Name:  Marisa Livingston    MRN: [id number] DOB: 30-Mar-1936  06/10/2020  [name] was observed post Covid-19 immunization for 15 minutes without incident. She was provided with Vaccine Information Sheet and instruction to access the V-Safe system.   Ms. Horn was instructed to call 911 with any severe reactions post vaccine: Marland Kitchen Difficulty breathing  . Swelling of face and throat  . A fast heartbeat  . A bad rash all over body  . Dizziness and weakness   Immunizations Administered    No immunizations on file.

## 2020-06-19 DIAGNOSIS — R000 Tachycardia, unspecified: Secondary | ICD-10-CM | POA: Insufficient documentation

## 2020-07-02 ENCOUNTER — Encounter: Payer: Self-pay | Admitting: Internal Medicine

## 2020-07-02 ENCOUNTER — Encounter: Payer: Self-pay | Admitting: *Deleted

## 2020-07-02 ENCOUNTER — Ambulatory Visit: Payer: PPO | Admitting: Internal Medicine

## 2020-07-02 ENCOUNTER — Telehealth: Payer: Self-pay | Admitting: Radiology

## 2020-07-02 ENCOUNTER — Other Ambulatory Visit: Payer: Self-pay

## 2020-07-02 VITALS — BP 142/80 | HR 74 | Ht 61.5 in | Wt 174.0 lb

## 2020-07-02 DIAGNOSIS — R0602 Shortness of breath: Secondary | ICD-10-CM

## 2020-07-02 DIAGNOSIS — R55 Syncope and collapse: Secondary | ICD-10-CM

## 2020-07-02 DIAGNOSIS — R000 Tachycardia, unspecified: Secondary | ICD-10-CM | POA: Diagnosis not present

## 2020-07-02 NOTE — Patient Instructions (Addendum)
Medication Instructions:  Your physician recommends that you continue on your current medications as directed. Please refer to the Current Medication list given to you today.  *If you need a refill on your cardiac medications before your next appointment, please call your pharmacy*  Lab Work: None ordered.  If you have labs (blood work) drawn today and your tests are completely normal, you will receive your results only by: Marland Kitchen MyChart Message (if you have MyChart) OR . A paper copy in the mail If you have any lab test that is abnormal or we need to change your treatment, we will call you to review the results.  Testing/Procedures: please schedule echo & 2 months with [place] physician has requested that you have an echocardiogram. Echocardiography is a painless test that uses sound waves to create images of your heart. It provides your doctor with information about the size and shape of your heart and how well your heart's chambers and valves are working. This procedure takes approximately one hour. There are no restrictions for this procedure. Your physician has recommended that you wear a heart monitor. Heart monitors are medical devices that record the heart's electrical activity. Doctors most often use these monitors to diagnose arrhythmias. Arrhythmias are problems with the speed or rhythm of the heartbeat. The monitor is a small, portable device. You can wear one while you do your normal daily activities. This is usually used to diagnose what is causing palpitations/syncope (passing out).   Follow-Up: At [facility], you and your health needs are our priority.  As part of our continuing mission to provide you with exceptional heart care, we have created designated Provider Care Teams.  These Care Teams include your primary Cardiologist (physician) and Advanced Practice Providers (APPs -  Physician Assistants and Nurse Practitioners) who all work together to provide you with the  care you need, when you need it.  We recommend signing up for the patient portal called "MyChart".  Sign up information is provided on this After Visit Summary.  MyChart is used to connect with patients for Virtual Visits (Telemedicine).  Patients are able to view lab/test results, encounter notes, upcoming appointments, etc.  Non-urgent messages can be sent to your provider as well.   To learn more about what you can do with MyChart, go to [email or web address].    Your next appointment:   Your physician wants you to follow-up in: 2 months with Tommye Standard.   Other Instructions: ZIO XT- Long Term Monitor Instructions   Your physician has requested you wear your ZIO patch monitor__10___days.   This is a single patch monitor.  [employer] supplies one patch monitor per enrollment.  Additional stickers are not available.   Please do not apply patch if you will be having a Nuclear Stress Test, Echocardiogram, Cardiac CT, MRI, or Chest Xray during the time frame you would be wearing the monitor. The patch cannot be worn during these tests.  You cannot remove and re-apply the ZIO XT patch monitor.   Your ZIO patch monitor will be sent USPS Priority mail from [facility] directly to your home address. The monitor may also be mailed to a PO BOX if home delivery is not available.   It may take 3-5 days to receive your monitor after you have been enrolled.   Once you have received you monitor, please review enclosed instructions.  Your monitor has already been registered assigning a specific monitor serial # to you.   Applying the monitor  Shave hair from upper left chest.   Hold abrader disc by orange tab.  Rub abrader in 40 strokes over left upper chest as indicated in your monitor instructions.   Clean area with 4 enclosed alcohol pads .  Use all pads to assure are is cleaned thoroughly.  Let dry.   Apply patch as indicated in monitor instructions.  Patch will be place under  collarbone on left side of chest with arrow pointing upward.   Rub patch adhesive wings for 2 minutes.Remove white label marked "1".  Remove white label marked "2".  Rub patch adhesive wings for 2 additional minutes.   While looking in a mirror, press and release button in center of patch.  A small green light will flash 3-4 times .  This will be your only indicator the monitor has been turned on.     Do not shower for the first 24 hours.  You may shower after the first 24 hours.   Press button if you feel a symptom. You will hear a small click.  Record Date, Time and Symptom in the Patient Log Book.   When you are ready to remove patch, follow instructions on last 2 pages of Patient Log Book.  Stick patch monitor onto last page of Patient Log Book.   Place Patient Log Book in [place] box.  Use locking tab on box and tape box closed securely.  The [employer] and [employer] has [employer] on it.  Please place in mailbox as soon as possible.  Your physician should have your test results approximately 7 days after the monitor has been mailed back to [facility].   Call [facility] at [phone] if you have questions regarding your ZIO XT patch monitor.  Call them immediately if you see an orange light blinking on your monitor.   If your monitor falls off in less than 4 days contact our Monitor department at [phone].  If your monitor becomes loose or falls off after 4 days call [employer] at [phone] for suggestions on securing your monitor.

## 2020-07-02 NOTE — Telephone Encounter (Signed)
Enrolled patient for a 10 day Zio XT Monitor to be mailed to patients home.

## 2020-07-02 NOTE — Progress Notes (Signed)
Electrophysiology Office Note   Date:  07/05/2020   ID:  Verl Dicker, DOB Feb 06, 1936, MRN [id number]  PCP:  Erven Colla, DO   Primary Electrophysiologist: Thompson Grayer, MD    CC: SOB   History of Present Illness: Marisa Livingston is a 84 y.o. female who presents today for cardiology evaluation.   She is referred by Dr Lovena Le for SOB.  The patient reports progressive SOB over the past few months.  She is not very active.  She has occasional sinus tachycardia.  LBBB chronically.  She has rare palpitations.  Today, she denies symptoms of chest pain, orthopnea, PND, lower extremity edema, claudication, dizziness, presyncope, syncope, bleeding, or neurologic sequela. The patient is tolerating medications without difficulties and is otherwise without complaint today.    Past Medical History:  Diagnosis Date  . Allergic rhinitis   . Hyperlipidemia   . Insomnia   . LBBB (left bundle branch block)   . Osteopenia    Past Surgical History:  Procedure Laterality Date  . COLONOSCOPY N/A 09/02/2015   Procedure: COLONOSCOPY;  Surgeon: Rogene Houston, MD;  Location: [facility];  Service: Endoscopy;  Laterality: N/A;  1025 - moved to 2/9 @ 8:30 - Ann notified pt  . POLYPECTOMY N/A 09/02/2015   Procedure: POLYPECTOMY;  Surgeon: Rogene Houston, MD;  Location: [facility];  Service: Endoscopy;  Laterality: N/A;  Hepatic Flexure Polyp     Current Outpatient Medications  Medication Sig Dispense Refill  . Calcium Citrate (CITRACAL PO) Take by mouth.    . Loratadine (CLARITIN PO) Take by mouth.    . Multiple Vitamins-Minerals (MULTIVITAMIN WITH MINERALS) tablet Take 1 tablet by mouth daily.    . simvastatin (ZOCOR) 20 MG tablet Take one-half tablet daily 45 tablet 1   No current facility-administered medications for this visit.    Allergies:   Codeine and Levaquin [levofloxacin in d5w]   Social History:  The patient  reports that she has never smoked. She has never used  smokeless tobacco. She reports that she does not drink alcohol and does not use drugs.   Family History:  The patient's  family history includes Cancer in her brother; Heart Problems in her mother.    ROS:  Please see the history of present illness.   All other systems are personally reviewed and negative.    PHYSICAL EXAM: VS:  BP (!) 142/80   Pulse 74   Ht 5' 1.5" (1.562 m)   Wt 174 lb (78.9 kg)   SpO2 98%   BMI 32.34 kg/m  , BMI Body mass index is 32.34 kg/m. GEN: Well nourished, well developed, in no acute distress HEENT: normal Neck: no JVD, carotid bruits, or masses Cardiac: RRR; no murmurs, rubs, or gallops,no edema  Respiratory:  clear to auscultation bilaterally, normal work of breathing GI: soft, nontender, nondistended, + BS MS: no deformity or atrophy Skin: warm and dry  Neuro:  Strength and sensation are intact Psych: euthymic mood, full affect  EKG:  EKG is ordered today. The ekg ordered today is personally reviewed and shows sinus rhythm, lbbb   Recent Labs: 06/01/2020: ALT 19; Hemoglobin 14.7; Platelets 282 06/09/2020: BUN 20; Creatinine, Ser 1.00; Potassium 5.4; Sodium 140  personally reviewed   Lipid Panel     Component Value Date/Time   CHOL 204 (H) 06/01/2020 0807   TRIG 164 (H) 06/01/2020 0807   HDL 66 06/01/2020 0807   CHOLHDL 3.1 06/01/2020 0807   CHOLHDL 3.1  07/25/2014 0810   VLDL 21 07/25/2014 0810   LDLCALC 110 (H) 06/01/2020 0807   personally reviewed   Wt Readings from Last 3 Encounters:  07/02/20 174 lb (78.9 kg)  06/09/20 173 lb (78.5 kg)  07/02/19 176 lb 12.8 oz (80.2 kg)      Other studies personally reviewed: Additional studies/ records that were reviewed today include: prior records  Review of the above records today demonstrates: as above   ASSESSMENT AND PLAN:  1.  SOB Likely multifactorial Echo, myoview, and monitor to further evaluate If low risk, no further CV testing is planned  2. LBBB/ palpitations Likely  age related Event monitor as above  3. Overweight Regular exercise and weight reduction advised  4. HL Continue zocor  Risks, benefits and potential toxicities for medications prescribed and/or refilled reviewed with patient today.    Follow-up:  6-8 weeks with [facility] If further workup is planned, we will refer to general cardiology  Current medicines are reviewed at length with the patient today.   The patient does not have concerns regarding her medicines.  The following changes were made today:  none  Labs/ tests ordered today include:  Orders Placed This Encounter  Procedures  . Cardiac Stress Test: Informed Consent Details: Physician/Practitioner Attestation; Transcribe to consent form and obtain patient signature  . MYOCARDIAL PERFUSION IMAGING  . LONG TERM MONITOR (3-14 DAYS)  . EKG 12-Lead  . ECHOCARDIOGRAM COMPLETE     Signed, Thompson Grayer, MD  07/05/2020 11:16 AM     [facility] [address] [place] [address] [phone] (office) [phone] (fax)

## 2020-07-05 ENCOUNTER — Ambulatory Visit (INDEPENDENT_AMBULATORY_CARE_PROVIDER_SITE_OTHER): Payer: PPO

## 2020-07-05 ENCOUNTER — Encounter: Payer: Self-pay | Admitting: Internal Medicine

## 2020-07-05 DIAGNOSIS — R000 Tachycardia, unspecified: Secondary | ICD-10-CM

## 2020-07-05 DIAGNOSIS — R0602 Shortness of breath: Secondary | ICD-10-CM | POA: Diagnosis not present

## 2020-07-05 DIAGNOSIS — R55 Syncope and collapse: Secondary | ICD-10-CM

## 2020-07-06 ENCOUNTER — Telehealth (HOSPITAL_COMMUNITY): Payer: Self-pay | Admitting: Radiology

## 2020-07-06 NOTE — Telephone Encounter (Incomplete)
Patient given detailed instructions per Myocardial Perfusion Study Information Sheet for the test on 07/27/2020 at 7:15. Patient notified to arrive 15 minutes early and that it is imperative to arrive on time for appointment to keep from having the test rescheduled.  If you need to cancel or reschedule your appointment, please call the office within 24 hours of your appointment. . Patient verbalized understanding.EHK

## 2020-07-23 DIAGNOSIS — R0602 Shortness of breath: Secondary | ICD-10-CM | POA: Diagnosis not present

## 2020-07-23 DIAGNOSIS — R000 Tachycardia, unspecified: Secondary | ICD-10-CM | POA: Diagnosis not present

## 2020-07-27 ENCOUNTER — Ambulatory Visit (HOSPITAL_BASED_OUTPATIENT_CLINIC_OR_DEPARTMENT_OTHER): Payer: PPO

## 2020-07-27 ENCOUNTER — Other Ambulatory Visit: Payer: Self-pay

## 2020-07-27 ENCOUNTER — Ambulatory Visit (HOSPITAL_COMMUNITY): Payer: PPO | Attending: Cardiology

## 2020-07-27 VITALS — Ht 61.0 in | Wt 174.0 lb

## 2020-07-27 DIAGNOSIS — R0602 Shortness of breath: Secondary | ICD-10-CM | POA: Diagnosis not present

## 2020-07-27 DIAGNOSIS — R000 Tachycardia, unspecified: Secondary | ICD-10-CM

## 2020-07-27 DIAGNOSIS — R110 Nausea: Secondary | ICD-10-CM

## 2020-07-27 DIAGNOSIS — R55 Syncope and collapse: Secondary | ICD-10-CM

## 2020-07-27 LAB — MYOCARDIAL PERFUSION IMAGING
LV dias vol: 74 mL (ref 46–106)
LV sys vol: 36 mL
Peak HR: 131 {beats}/min
Rest HR: 95 {beats}/min
SDS: 3
SRS: 0
SSS: 3
TID: 1.14

## 2020-07-27 LAB — ECHOCARDIOGRAM COMPLETE
Area-P 1/2: 6.27 cm2
Height: 61 in
P 1/2 time: 410 msec
S' Lateral: 3.5 cm
Weight: 2784 oz

## 2020-07-27 MED ORDER — AMINOPHYLLINE 25 MG/ML IV SOLN
75.0000 mg | Freq: Once | INTRAVENOUS | Status: AC
  Administered 2020-07-27: 75 mg via INTRAVENOUS

## 2020-07-27 MED ORDER — TECHNETIUM TC 99M TETROFOSMIN IV KIT
31.7000 | PACK | Freq: Once | INTRAVENOUS | Status: AC | PRN
  Administered 2020-07-27: 31.7 via INTRAVENOUS
  Filled 2020-07-27: qty 32

## 2020-07-27 MED ORDER — REGADENOSON 0.4 MG/5ML IV SOLN
0.4000 mg | Freq: Once | INTRAVENOUS | Status: AC
  Administered 2020-07-27: 0.4 mg via INTRAVENOUS

## 2020-07-27 MED ORDER — PERFLUTREN LIPID MICROSPHERE
1.0000 mL | INTRAVENOUS | Status: AC | PRN
  Administered 2020-07-27: 2 mL via INTRAVENOUS

## 2020-07-27 MED ORDER — TECHNETIUM TC 99M TETROFOSMIN IV KIT
10.8000 | PACK | Freq: Once | INTRAVENOUS | Status: AC | PRN
  Administered 2020-07-27: 10.8 via INTRAVENOUS
  Filled 2020-07-27: qty 11

## 2020-07-29 ENCOUNTER — Telehealth: Payer: Self-pay | Admitting: *Deleted

## 2020-07-29 DIAGNOSIS — R000 Tachycardia, unspecified: Secondary | ICD-10-CM

## 2020-07-29 NOTE — Telephone Encounter (Signed)
-----   Message from Hillis Range, MD sent at 07/27/2020 10:24 PM EST ----- Results reviewed.  Inetta Fermo, please inform pt of result. I will route to primary care also.  Reduce EF is noted.  Will need to establish with general cardiology to further manage.

## 2020-07-29 NOTE — Telephone Encounter (Signed)
The patient has been notified of the result and verbalized understanding.  All questions (if any) were answered. Sampson Goon, RN 07/29/2020 9:14 AM    Placed referral order.  Patient aware and agreeable to plan.

## 2020-08-03 ENCOUNTER — Ambulatory Visit: Payer: PPO | Admitting: Cardiology

## 2020-08-03 ENCOUNTER — Encounter: Payer: Self-pay | Admitting: Cardiology

## 2020-08-03 ENCOUNTER — Other Ambulatory Visit: Payer: Self-pay

## 2020-08-03 VITALS — BP 160/80 | HR 114 | Ht 61.0 in | Wt 175.0 lb

## 2020-08-03 DIAGNOSIS — I447 Left bundle-branch block, unspecified: Secondary | ICD-10-CM

## 2020-08-03 DIAGNOSIS — R000 Tachycardia, unspecified: Secondary | ICD-10-CM | POA: Diagnosis not present

## 2020-08-03 MED ORDER — LOSARTAN POTASSIUM 25 MG PO TABS
25.0000 mg | ORAL_TABLET | Freq: Every day | ORAL | 3 refills | Status: DC
Start: 2020-08-03 — End: 2020-09-06

## 2020-08-03 MED ORDER — METOPROLOL SUCCINATE ER 50 MG PO TB24
50.0000 mg | ORAL_TABLET | Freq: Every day | ORAL | 3 refills | Status: DC
Start: 2020-08-03 — End: 2021-07-20

## 2020-08-03 NOTE — Progress Notes (Signed)
Cardiology Office Note:    Date:  08/03/2020   ID:  Marisa Livingston, DOB November 12, 1935, MRN [id number]  PCP:  Erven Colla, DO  [facility] Cardiologist:  Candee Furbish, MD  [facility] Electrophysiologist:  None   Referring MD: Thompson Grayer, MD    History of Present Illness:    Marisa Livingston is a 85 y.o. female here today for the evaluation of tachycardia at the request of Dr. Rayann Heman.  Previous note reviewed.  Has chronic left bundle branch block.  Rare palpitations.  Denies any chest pain PND orthopnea.  Seems to be tolerating her medications well.  She has some progressive shortness of breath.  An echocardiogram and nuclear stress test were reviewed.  Overall she is been feeling mild shortness of breath with activity.  No significant anginal symptoms.  No fevers chills nausea vomiting syncope bleeding.  Testing as described below with echo and nuclear stress test.  Past Medical History:  Diagnosis Date  . Allergic rhinitis   . Hyperlipidemia   . Insomnia   . LBBB (left bundle branch block)   . Osteopenia     Past Surgical History:  Procedure Laterality Date  . COLONOSCOPY N/A 09/02/2015   Procedure: COLONOSCOPY;  Surgeon: Rogene Houston, MD;  [facility];  Service: Endoscopy;  Laterality: N/A;  1025 - moved to 2/9 @ 8:30 - Ann notified pt  . POLYPECTOMY N/A 09/02/2015   Procedure: POLYPECTOMY;  Surgeon: Rogene Houston, MD;  [facility];  Service: Endoscopy;  Laterality: N/A;  Hepatic Flexure Polyp    Current Medications: Current Meds  Medication Sig  . Calcium Citrate (CITRACAL PO) Take by mouth.  . Loratadine (CLARITIN PO) Take by mouth.  . losartan (COZAAR) 25 MG tablet Take 1 tablet (25 mg total) by mouth daily.  . metoprolol succinate (TOPROL-XL) 50 MG 24 hr tablet Take 1 tablet (50 mg total) by mouth daily. Take with or immediately following a meal.  . Multiple Vitamins-Minerals (MULTIVITAMIN WITH MINERALS) tablet Take 1 tablet by  mouth daily.  . simvastatin (ZOCOR) 20 MG tablet Take one-half tablet daily     Allergies:   Codeine and Levaquin [levofloxacin in d5w]   Social History   Socioeconomic History  . Marital status: Married    Spouse name: Not on file  . Number of children: Not on file  . Years of education: Not on file  . Highest education level: Not on file  Occupational History  . Not on file  Tobacco Use  . Smoking status: Never Smoker  . Smokeless tobacco: Never Used  Vaping Use  . Vaping Use: Never used  Substance and Sexual Activity  . Alcohol use: No  . Drug use: No  . Sexual activity: Not Currently    Birth control/protection: Post-menopausal  Other Topics Concern  . Not on file  Social History Narrative   Lives in alone in [place]   Retired Biochemist, clinical for [employer] Strain: Not on [employer] Insecurity: Not on file  Transportation Needs: Not on file  Physical Activity: Not on file  Stress: Not on file  Social Connections: Not on file     Family History: The patient's family history includes Cancer in her brother; Heart Problems in her mother.  ROS:   Please see the history of present illness.     All other systems reviewed and are negative.  EKGs/Labs/Other Studies Reviewed:  The following studies were reviewed today:  ECHO 07/27/20: 1. Global hypokinesis (worse in the septum); overall moderate to severe  LV dysfunction.  2. Left ventricular ejection fraction, by estimation, is 30 to 35%. The  left ventricle has moderately decreased function. The left ventricle  demonstrates regional wall motion abnormalities (see scoring  diagram/findings for description). Left ventricular  diastolic parameters are consistent with Grade II diastolic dysfunction  (pseudonormalization). Elevated left atrial pressure.  3. Right ventricular systolic function is normal. The right ventricular  size is  normal.  4. The mitral valve is normal in structure. Trivial mitral valve  regurgitation. No evidence of mitral stenosis.  5. The aortic valve is tricuspid. Aortic valve regurgitation is trivial.  No aortic stenosis is present.   NUC stress:   EKG not interpretable due to LBBB  The left ventricular ejection fraction is mildly decreased (45-54%).  Nuclear stress EF: 51%.  Defect 1: There is a defect present in the basal inferoseptal, mid inferoseptal, apical septal, apical inferior and apex location.  Findings consistent with prior myocardial infarction.  This is a low risk study.   1. Fixed perfusion defect in basal to mid inferoseptal, apical septal, and apex, with hypokinesis in these regions, consistent with prior infarct.  Given septal perfusion defects, could also be due to LBBB  2. No ischemia 3. Low risk study   Recent Labs: 06/01/2020: ALT 19; Hemoglobin 14.7; Platelets 282 06/09/2020: BUN 20; Creatinine, Ser 1.00; Potassium 5.4; Sodium 140  Recent Lipid Panel    Component Value Date/Time   CHOL 204 (H) 06/01/2020 0807   TRIG 164 (H) 06/01/2020 0807   HDL 66 06/01/2020 0807   CHOLHDL 3.1 06/01/2020 0807   CHOLHDL 3.1 07/25/2014 0810   VLDL 21 07/25/2014 0810   LDLCALC 110 (H) 06/01/2020 0807     Risk Assessment/Calculations:       Physical Exam:    VS:  BP (!) 160/80 (BP Location: Left Arm, Patient Position: Sitting, Cuff Size: Normal)   Pulse (!) 114   Ht 5\' 1"  (1.549 m)   Wt 175 lb (79.4 kg)   SpO2 97%   BMI 33.07 kg/m     Wt Readings from Last 3 Encounters:  08/03/20 175 lb (79.4 kg)  07/27/20 174 lb (78.9 kg)  07/02/20 174 lb (78.9 kg)     GEN:  Well nourished, well developed in no acute distress HEENT: Normal NECK: No JVD; No carotid bruits LYMPHATICS: No lymphadenopathy CARDIAC: RRR, no murmurs, rubs, gallops RESPIRATORY:  Clear to auscultation without rales, wheezing or rhonchi  ABDOMEN: Soft, non-tender,  non-distended MUSCULOSKELETAL:  No edema; No deformity  SKIN: Warm and dry NEUROLOGIC:  Alert and oriented x 3 PSYCHIATRIC:  Normal affect   ASSESSMENT:    1. Tachycardia   2. LBBB (left bundle branch block)    PLAN:    In order of problems listed above:  Shortness of breath Left bundle branch block Cardiomyopathy - Echo EF was in the 30 to 35% range however nuclear stress EF was 51.  Personally reviewed the nuclear stress images, stress images actually look improved as a relates to rest images.  There may not be any significant infarct pattern. -We will go ahead and start Toprol-XL 50 mg once a day -Start losartan 25 mg once a day. Creatinine is 1.0.  Prior potassiums have been on the higher side at 5.7, 5.5 and 5.4.  Avoid foods that are high in potassium. -At some point may deserve left heart catheterization.  We discussed at length with her son.  For strategy will be to employ medical management to see if we can help improve symptom control.  We will see her back in 1 month.        Medication Adjustments/Labs and Tests Ordered: Current medicines are reviewed at length with the patient today.  Concerns regarding medicines are outlined above.  No orders of the defined types were placed in this encounter.  Meds ordered this encounter  Medications  . metoprolol succinate (TOPROL-XL) 50 MG 24 hr tablet    Sig: Take 1 tablet (50 mg total) by mouth daily. Take with or immediately following a meal.    Dispense:  90 tablet    Refill:  3  . losartan (COZAAR) 25 MG tablet    Sig: Take 1 tablet (25 mg total) by mouth daily.    Dispense:  90 tablet    Refill:  3    Patient Instructions  Medication Instructions:  Please start Metoprolol Succinate 50 mg a day and Losartan 25 mg a day. Continue all other medications as listed.  *If you need a refill on your cardiac medications before your next appointment, please call your pharmacy*  Follow-Up: At [facility], you and  your health needs are our priority.  As part of our continuing mission to provide you with exceptional heart care, we have created designated Provider Care Teams.  These Care Teams include your primary Cardiologist (physician) and Advanced Practice Providers (APPs -  Physician Assistants and Nurse Practitioners) who all work together to provide you with the care you need, when you need it.  We recommend signing up for the patient portal called "MyChart".  Sign up information is provided on this After Visit Summary.  MyChart is used to connect with patients for Virtual Visits (Telemedicine).  Patients are able to view lab/test results, encounter notes, upcoming appointments, etc.  Non-urgent messages can be sent to your provider as well.   To learn more about what you can do with MyChart, go to [email or web address].    Your next appointment:   3 months  The format for your next appointment:   In Person  Provider:   Candee Furbish, MD   Thank you for choosing [facility]!!         Signed, Candee Furbish, MD  08/03/2020 6:25 PM    [facility]

## 2020-08-03 NOTE — Patient Instructions (Addendum)
Medication Instructions:  Please start Metoprolol Succinate 50 mg a day and Losartan 25 mg a day. Continue all other medications as listed.  *If you need a refill on your cardiac medications before your next appointment, please call your pharmacy*  Follow-Up: At [facility], you and your health needs are our priority.  As part of our continuing mission to provide you with exceptional heart care, we have created designated Provider Care Teams.  These Care Teams include your primary Cardiologist (physician) and Advanced Practice Providers (APPs -  Physician Assistants and Nurse Practitioners) who all work together to provide you with the care you need, when you need it.  We recommend signing up for the patient portal called "MyChart".  Sign up information is provided on this After Visit Summary.  MyChart is used to connect with patients for Virtual Visits (Telemedicine).  Patients are able to view lab/test results, encounter notes, upcoming appointments, etc.  Non-urgent messages can be sent to your provider as well.   To learn more about what you can do with MyChart, go to [email or web address].    Your next appointment:   3 months  The format for your next appointment:   In Person  Provider:   Candee Furbish, MD   Thank you for choosing [facility]!!

## 2020-08-18 ENCOUNTER — Encounter (INDEPENDENT_AMBULATORY_CARE_PROVIDER_SITE_OTHER): Payer: Self-pay | Admitting: *Deleted

## 2020-08-30 NOTE — Progress Notes (Addendum)
Cardiology Office Note Date:  08/30/2020  Patient ID:  Aarvi, Stotts 05/25/1936, MRN [id number] PCP:  Erven Colla, DO  Cardiologist:  Dr. Marlou Porch Electrophysiologist: Dr. Rayann Heman    Chief Complaint: planned f/u  History of Present Illness: Marisa Livingston is a 85 y.o. female with history of HLD,  LBBB, CM  She was referred to Dr. Rayann Heman PMD for c/o SOB, palpitations and LBBB on AEKG, she saw him 07/02/20, planned for TTE, stress testing, monitoring.  TTE revealed LVEF 30-35%, fixed defect on her myoview, monitor noted rare PACs, PVCs and referred to Dr. Marlou Porch for general cardiology care and management.  She saw him 08/03/20, started on Toprol and losartan and planned for a 39mo follow up visit.  TODAY She is accompanied y her sone today She thinks she may be feeling a bit better, less winded but still has some DOE> She denies any CP, palpitations or cardiac awareness She mentions casual paced walking with her ADLs does not provoke SOB but activities like laundry, changing the clothes from washer to dry will make her winded. No rest SOB, no nocturnal or positional SOB. She is tolerating the medications well. She has a single episode of syncope in her history, back Dec 2020, was standing at the sink and tuned to get something in the pantry and blacked out, no clear warning, no symptoms on waking but for some reason she suspected her blood sugar was low. She saw her PMD at the time, reported a normal EKG and no [particular recommendations were made. None since then No reports of near syncope, weak spells  Reviewed her test results with them, discussed general strategy for management of CM.  She will be due for 5 year screening colonoscopy and one of the pre-procedure questions is if she has had an MI in the last 5 years. Discussed unable to say, though stress test suggests some prior event, timing is unknown or perhaps an age related change in her heart alone. Suggested of  this is a screening colonoscopy, would wait on her f/u with Dr. Marlou Porch and ongoing response to her therapy.   Past Medical History:  Diagnosis Date   Allergic rhinitis    Hyperlipidemia    Insomnia    LBBB (left bundle branch block)    Osteopenia     Past Surgical History:  Procedure Laterality Date   COLONOSCOPY N/A 09/02/2015   Procedure: COLONOSCOPY;  Surgeon: Rogene Houston, MD;  [facility];  Service: Endoscopy;  Laterality: N/A;  1025 - moved to 2/9 @ 8:30 - Ann notified pt   POLYPECTOMY N/A 09/02/2015   Procedure: POLYPECTOMY;  Surgeon: Rogene Houston, MD;  [facility];  Service: Endoscopy;  Laterality: N/A;  Hepatic Flexure Polyp    Current Outpatient Medications  Medication Sig Dispense Refill   Calcium Citrate (CITRACAL PO) Take by mouth.     Loratadine (CLARITIN PO) Take by mouth.     losartan (COZAAR) 25 MG tablet Take 1 tablet (25 mg total) by mouth daily. 90 tablet 3   metoprolol succinate (TOPROL-XL) 50 MG 24 hr tablet Take 1 tablet (50 mg total) by mouth daily. Take with or immediately following a meal. 90 tablet 3   Multiple Vitamins-Minerals (MULTIVITAMIN WITH MINERALS) tablet Take 1 tablet by mouth daily.     simvastatin (ZOCOR) 20 MG tablet Take one-half tablet daily 45 tablet 1   No current facility-administered medications for this visit.    Allergies:  Codeine and Levaquin [levofloxacin in d5w]   Social History:  The patient  reports that she has never smoked. She has never used smokeless tobacco. She reports that she does not drink alcohol and does not use drugs.   Family History:  The patient's family history includes Cancer in her brother; Heart Problems in her mother.  ROS:  Please see the history of present illness.    All other systems are reviewed and otherwise negative.   PHYSICAL EXAM:  VS:  There were no vitals taken for this visit. BMI: There is no height or weight on file to calculate BMI. Well  nourished, well developed, in no acute distress HEENT: normocephalic, atraumatic Neck: no JVD, carotid bruits or masses Cardiac:  RRR; no significant murmurs, no rubs, or gallops Lungs:  CTA b/l, no wheezing, rhonchi or rales Abd: soft, nontender MS: no deformity or atrophy Ext:  no edema Skin: warm and dry, no rash Neuro:  No gross deficits appreciated Psych: euthymic mood, full affect   EKG:  Not done today  07/27/20: stress myoview  EKG not interpretable due to LBBB  The left ventricular ejection fraction is mildly decreased (45-54%).  Nuclear stress EF: 51%.  Defect 1: There is a defect present in the basal inferoseptal, mid inferoseptal, apical septal, apical inferior and apex location.  Findings consistent with prior myocardial infarction.  This is a low risk study.   1. Fixed perfusion defect in basal to mid inferoseptal, apical septal, and apex, with hypokinesis in these regions, consistent with prior infarct.  Given septal perfusion defects, could also be due to LBBB  2. No ischemia 3. Low risk study    07/27/20: TTE IMPRESSIONS  1. Global hypokinesis (worse in the septum); overall moderate to severe  LV dysfunction.  2. Left ventricular ejection fraction, by estimation, is 30 to 35%. The  left ventricle has moderately decreased function. The left ventricle  demonstrates regional wall motion abnormalities (see scoring  diagram/findings for description). Left ventricular  diastolic parameters are consistent with Grade II diastolic dysfunction  (pseudonormalization). Elevated left atrial pressure.  3. Right ventricular systolic function is normal. The right ventricular  size is normal.  4. The mitral valve is normal in structure. Trivial mitral valve  regurgitation. No evidence of mitral stenosis.  5. The aortic valve is tricuspid. Aortic valve regurgitation is trivial.  No aortic stenosis is present.    Jan 2022 monitoring Sinus rhythm Rare premature  atrial contractions and rare premature ventricular contractions Baseline artifact frequently limits interpretation  Recent Labs: 06/01/2020: ALT 19; Hemoglobin 14.7; Platelets 282 06/09/2020: BUN 20; Creatinine, Ser 1.00; Potassium 5.4; Sodium 140  06/01/2020: Chol/HDL Ratio 3.1; Cholesterol, Total 204; HDL 66; LDL Chol Calc (NIH) 110; Triglycerides 164   CrCl cannot be calculated (Patient's most recent lab result is older than the maximum 21 days allowed.).   Wt Readings from Last 3 Encounters:  08/03/20 175 lb (79.4 kg)  07/27/20 174 lb (78.9 kg)  07/02/20 174 lb (78.9 kg)     Other studies reviewed: Additional studies/records reviewed today include: summarized above  ASSESSMENT AND PLAN:  1. CM  2. LBBB     Fixed defect on myoview, no cath hx  TTE with LVEF 30-35%, her Myoview 45-54% (51%) No CP or anginal symptoms, no know MI events in her hx BP likely could tolerate titration of her medicines, though will defer to Dr. Marlou Porch, I will send hi my note for any recommendations or adjustment he may want to  do before his visit in April  follow LVEF with GDMT I don't think there is role for CRT-p at this juncture, she does not seem to be having trouble with heart failure/volume.   3. Palpitations     Rare PACs, PVCs     Not an ongoing complaint     No extrasystoles appreciated on her exam today     Disposition: F/u with Dr. Marlou Porch as scheduled, sooner if needed  Current medicines are reviewed at length with the patient today.  The patient did not have any concerns regarding medicines.  Venetia Night, PA-C 08/30/2020 9:44 AM     [facility] [address]  [place] [address] [phone] (office)  [phone] (fax)

## 2020-09-02 ENCOUNTER — Other Ambulatory Visit: Payer: Self-pay

## 2020-09-02 ENCOUNTER — Ambulatory Visit: Payer: PPO | Admitting: Physician Assistant

## 2020-09-02 ENCOUNTER — Encounter: Payer: Self-pay | Admitting: Physician Assistant

## 2020-09-02 VITALS — BP 140/80 | HR 72 | Ht 61.0 in | Wt 175.0 lb

## 2020-09-02 DIAGNOSIS — I429 Cardiomyopathy, unspecified: Secondary | ICD-10-CM

## 2020-09-02 DIAGNOSIS — I447 Left bundle-branch block, unspecified: Secondary | ICD-10-CM

## 2020-09-02 NOTE — Patient Instructions (Signed)
Medication Instructions:    Your physician recommends that you continue on your current medications as directed. Please refer to the Current Medication list given to you today.  *If you need a refill on your cardiac medications before your next appointment, please call your pharmacy*   Lab Work: [place]   If you have labs (blood work) drawn today and your tests are completely normal, you will receive your results only by: Marland Kitchen MyChart Message (if you have MyChart) OR . A paper copy in the mail If you have any lab test that is abnormal or we need to change your treatment, we will call you to review the results.   Testing/Procedures: NONE ORDERED  TODAY   Follow-Up: At [facility], you and your health needs are our priority.  As part of our continuing mission to provide you with exceptional heart care, we have created designated Provider Care Teams.  These Care Teams include your primary Cardiologist (physician) and Advanced Practice Providers (APPs -  Physician Assistants and Nurse Practitioners) who all work together to provide you with the care you need, when you need it.  We recommend signing up for the patient portal called "MyChart".  Sign up information is provided on this After Visit Summary.  MyChart is used to connect with patients for Virtual Visits (Telemedicine).  Patients are able to view lab/test results, encounter notes, upcoming appointments, etc.  Non-urgent messages can be sent to your provider as well.   To learn more about what you can do with MyChart, go to [email or web address].    Your next appointment:   AS SCHEDULED WITH DR Marlou Porch   Other Instructions

## 2020-09-06 ENCOUNTER — Telehealth: Payer: Self-pay | Admitting: *Deleted

## 2020-09-06 DIAGNOSIS — I429 Cardiomyopathy, unspecified: Secondary | ICD-10-CM

## 2020-09-06 DIAGNOSIS — Z79899 Other long term (current) drug therapy: Secondary | ICD-10-CM

## 2020-09-06 MED ORDER — SACUBITRIL-VALSARTAN 24-26 MG PO TABS: 1.0000 | ORAL_TABLET | Freq: Two times a day (BID) | ORAL | 6 refills | Status: DC

## 2020-09-06 NOTE — Telephone Encounter (Signed)
Spoke with pt who is aware to start Entresto 24-26 mg BID and d/c Losartan.  She will have blood work in 1 week.  She is agreeable to this medication change and need for f/u lab work.  All questions, if any were answered at the time of the call.

## 2020-09-06 NOTE — Telephone Encounter (Signed)
With reduced EF and SBP 140, let's stop losartan 25 and start Entresto 24/26mg  PO BID.  K has been 5.4 range. Hesitant to use spironolactone.   Repeat BMET one week after starting Entresto.   Thanks   Candee Furbish, MD

## 2020-09-07 NOTE — Telephone Encounter (Signed)
Pt c/o medication issue:  1. Name of Medication: sacubitril-valsartan (ENTRESTO) 24-26 MG  2. How are you currently taking this medication (dosage and times per day)?  Patient has not started this medication.  3. Are you having a reaction (difficulty breathing--STAT)? No   4. What is your medication issue?   Patient is follow up regarding starting Entresto. She states [employer] received the prescription, but they are requesting a PA from Dr. Marlou Porch prior to distributing it.

## 2020-09-07 NOTE — Telephone Encounter (Signed)
Called and spoke with [place] regarding PA for [facility]. Called [address] with Marissa regarding case # [id number] who then transferred me to [facility] PA for Entresto 24-26 mg BID - ref # [id number] - Called and advised Sherren Mocha and [place] of the PA approval.

## 2020-09-14 ENCOUNTER — Other Ambulatory Visit: Payer: PPO

## 2020-09-15 ENCOUNTER — Telehealth: Payer: Self-pay | Admitting: Cardiology

## 2020-09-15 NOTE — Telephone Encounter (Signed)
Discussed with Marcelle Overlie PharmD if pt's B/P is not low then should take  Entresto 24/26 mg bid  Per pt B/P this am was 147/72 the patient aware to resume med and take am dose .Adonis Housekeeper

## 2020-09-15 NOTE — Telephone Encounter (Signed)
Pt c/o medication issue:  1. Name of Medication: sacubitril-valsartan (ENTRESTO) 24-26 MG  2. How are you currently taking this medication (dosage and times per day)? Normally as prescribed, but took two last night out of error. She took one at 8:00 PM and another at 8:30 PM.   3. Are you having a reaction (difficulty breathing--STAT)? No   4. What is your medication issue? Marisa Livingston is wanting to know when she should take the next dose of her Entresto due to this.

## 2020-09-16 ENCOUNTER — Other Ambulatory Visit: Payer: Self-pay

## 2020-09-16 ENCOUNTER — Other Ambulatory Visit: Payer: PPO

## 2020-09-16 DIAGNOSIS — Z79899 Other long term (current) drug therapy: Secondary | ICD-10-CM

## 2020-09-16 DIAGNOSIS — I429 Cardiomyopathy, unspecified: Secondary | ICD-10-CM

## 2020-09-16 LAB — BASIC METABOLIC PANEL
BUN/Creatinine Ratio: 19 (ref 12–28)
BUN: 20 mg/dL (ref 8–27)
CO2: 24 mmol/L (ref 20–29)
Calcium: 10 mg/dL (ref 8.7–10.3)
Chloride: 101 mmol/L (ref 96–106)
Creatinine, Ser: 1.05 mg/dL — ABNORMAL HIGH (ref 0.57–1.00)
GFR calc Af Amer: 57 mL/min/{1.73_m2} — ABNORMAL LOW (ref >59)
GFR calc non Af Amer: 50 mL/min/{1.73_m2} — ABNORMAL LOW (ref >59)
Glucose: 104 mg/dL — ABNORMAL HIGH (ref 65–99)
Potassium: 4.3 mmol/L (ref 3.5–5.2)
Sodium: 141 mmol/L (ref 134–144)

## 2020-11-02 ENCOUNTER — Ambulatory Visit: Payer: PPO | Admitting: Cardiology

## 2020-11-22 ENCOUNTER — Other Ambulatory Visit: Payer: Self-pay | Admitting: Family Medicine

## 2020-11-22 NOTE — Telephone Encounter (Signed)
No results found for: HGBA1C  Lab Results  Component Value Date   CREATININE 1.05 (H) 09/16/2020     Lab Results  Component Value Date   CHOL 204 (H) 06/01/2020   HDL 66 06/01/2020   LDLCALC 110 (H) 06/01/2020   TRIG 164 (H) 06/01/2020   CHOLHDL 3.1 06/01/2020     BP Readings from Last 3 Encounters:  09/02/20 140/80  08/03/20 (!) 160/80  07/02/20 (!) 142/80

## 2020-11-24 ENCOUNTER — Ambulatory Visit: Payer: PPO | Admitting: Cardiology

## 2020-11-24 ENCOUNTER — Encounter: Payer: Self-pay | Admitting: Cardiology

## 2020-11-24 ENCOUNTER — Other Ambulatory Visit: Payer: Self-pay

## 2020-11-24 VITALS — BP 124/80 | HR 67 | Ht 61.0 in | Wt 175.0 lb

## 2020-11-24 DIAGNOSIS — I5022 Chronic systolic (congestive) heart failure: Secondary | ICD-10-CM

## 2020-11-24 DIAGNOSIS — I447 Left bundle-branch block, unspecified: Secondary | ICD-10-CM | POA: Diagnosis not present

## 2020-11-24 NOTE — Patient Instructions (Addendum)
Medication Instructions:  The current medical regimen is effective;  continue present plan and medications.  *If you need a refill on your cardiac medications before your next appointment, please call your pharmacy*  Testing/Procedures: Your physician has requested that you have an echocardiogram. Echocardiography is a painless test that uses sound waves to create images of your heart. It provides your doctor with information about the size and shape of your heart and how well your heart's chambers and valves are working. This procedure takes approximately one hour. There are no restrictions for this procedure.  Follow-Up: At [facility], you and your health needs are our priority.  As part of our continuing mission to provide you with exceptional heart care, we have created designated Provider Care Teams.  These Care Teams include your primary Cardiologist (physician) and Advanced Practice Providers (APPs -  Physician Assistants and Nurse Practitioners) who all work together to provide you with the care you need, when you need it.  We recommend signing up for the patient portal called "MyChart".  Sign up information is provided on this After Visit Summary.  MyChart is used to connect with patients for Virtual Visits (Telemedicine).  Patients are able to view lab/test results, encounter notes, upcoming appointments, etc.  Non-urgent messages can be sent to your provider as well.   To learn more about what you can do with MyChart, go to [email or web address].    Your next appointment:   6 month(s)  The format for your next appointment:   In Person  Provider:   Candee Furbish, MD   Thank you for choosing [facility]!!     Heart Failure Eating Plan Heart failure, also called congestive heart failure, occurs when your heart does not pump blood well enough to meet your body's needs for oxygen-rich blood. Heart failure is a long-term (chronic) condition. Living with heart failure  can be challenging. Following your health care provider's instructions about a healthy lifestyle and working with a dietitian to choose the right foods may help to improve your symptoms. An eating plan for someone with heart failure will include changes that limit the intake of salt (sodium) and unhealthy fat. What are tips for following this plan? Reading food labels  Check food labels for the amount of sodium per serving. Choose foods that have less than 140 mg (milligrams) of sodium in each serving.  Check food labels for the number of calories per serving. This is important if you need to limit your daily calorie intake to lose weight.  Check food labels for the serving size. If you eat more than one serving, you will be eating more sodium and calories than what is listed on the label.  Look for foods that are labeled as "sodium-free," "very low sodium," or "low sodium." ? Foods labeled as "reduced sodium" or "lightly salted" may still have more sodium than what is recommended for you. Cooking  Avoid adding salt when cooking. Ask your health care provider or dietitian before using salt substitutes.  Season food with salt-free seasonings, spices, or herbs. Check the label of seasoning mixes to make sure they do not contain salt.  Cook with heart-healthy oils, such as olive, canola, soybean, or sunflower oil.  Do not fry foods. Cook foods using low-fat methods, such as baking, boiling, grilling, and broiling.  Limit unhealthy fats when cooking by: ? Removing the skin from poultry, such as chicken. ? Removing all visible fats from meats. ? Skimming the fat off from stews, soups,  and gravies before serving them. Meal planning  Limit your intake of: ? Processed, canned, or prepackaged foods. ? Foods that are high in trans fat, such as fried foods. ? Sweets, desserts, sugary drinks, and other foods with added sugar. ? Full-fat dairy products, such as whole milk.  Eat a balanced diet.  This may include: ? 4-5 servings of fruit each day and 4-5 servings of vegetables each day. At each meal, try to fill one-half of your plate with fruits and vegetables. ? Up to 6-8 servings of whole grains each day. ? Up to 2 servings of lean meat, poultry, or fish each day. One serving of meat is equal to 3 oz (85 g). This is about the same size as a deck of cards. ? 2 servings of low-fat dairy each day. ? Heart-healthy fats. Healthy fats called omega-3 fatty acids are found in foods such as flaxseed and cold-water fish like sardines, salmon, and mackerel.  Aim to eat 25-35 g (grams) of fiber a day. Foods that are high in fiber include apples, broccoli, carrots, beans, peas, and whole grains.  Do not add salt or condiments that contain salt (such as soy sauce) to foods before eating.  When eating at a restaurant, ask that your food be prepared with less salt or no salt, if possible.  Try to eat 2 or more vegetarian meals each week.  Eat more home-cooked food and eat less restaurant, buffet, and fast food.   General information  Do not eat more than 2,300 mg of sodium a day. The amount of sodium that is recommended for you may be lower, depending on your condition.  Maintain a healthy body weight as directed. Ask your health care provider what a healthy weight is for you. ? Check your weight every day. ? Work with your health care provider and dietitian to make a plan that is right for you to lose weight or maintain your current weight.  Limit how much fluid you drink. Ask your health care provider or dietitian how much fluid you can have each day.  Limit or avoid alcohol as told by your health care provider or dietitian. Recommended foods Fruits All fresh, frozen, and canned fruits. Dried fruits, such as raisins, prunes, and cranberries. Vegetables All fresh vegetables. Vegetables that are frozen without sauce or added salt. Low-sodium or sodium-free canned vegetables. Grains Bread  with less than 80 mg of sodium per slice. Whole-wheat pasta, quinoa, and brown rice. Oats and oatmeal. Barley. [place]. Grits and cream of wheat. Whole-grain and whole-wheat cold cereal. Meats and other protein foods Lean cuts of meat. Skinless chicken and [place]. Fish with high omega-3 fatty acids, such as salmon, sardines, and other cold-water fishes. Eggs. Dried beans, peas, and edamame. Unsalted nuts and nut butters. Dairy Low-fat or nonfat (skim) milk and dried milk. Rice milk, soy milk, and almond milk. Low-fat or nonfat yogurt. Small amounts of reduced-sodium block cheese. Low-sodium cottage cheese. Fats and oils Olive, canola, soybean, flaxseed, avocado, or sunflower oil. Sweets and desserts Applesauce. Granola bars. Sugar-free pudding and gelatin. Frozen fruit bars. Seasoning and other foods Fresh and dried herbs. Lemon or lime juice. Vinegar. Low-sodium ketchup. Salt-free marinades, salad dressings, sauces, and seasonings. The items listed above may not be a complete list of foods and beverages you can eat. Contact a dietitian for more information. Foods to avoid Fruits Fruits that are dried with sodium-containing preservatives. Vegetables Canned vegetables. Frozen vegetables with sauce or seasonings. Creamed vegetables. [place] fries. Onion  rings. Pickled vegetables and sauerkraut. Grains Bread with more than 80 mg of sodium per slice. Hot or cold cereal with more than 140 mg sodium per serving. Salted pretzels and crackers. Prepackaged breadcrumbs. Bagels, croissants, and biscuits. Meats and other protein foods Ribs and chicken wings. Bacon, ham, pepperoni, bologna, salami, and packaged luncheon meats. Hot dogs, bratwurst, and sausage. Canned meat. Smoked meat and fish. Salted nuts and seeds. Dairy Whole milk, half-and-half, and cream. Buttermilk. Processed cheese, cheese spreads, and cheese curds. Regular cottage cheese. Feta cheese. Shredded cheese. String cheese. Fats and  oils Butter, lard, shortening, ghee, and bacon fat. Canned and packaged gravies. Seasoning and other foods Onion salt, garlic salt, table salt, and sea salt. Marinades. Regular salad dressings. Relishes, pickles, and olives. Meat flavorings and tenderizers, and bouillon cubes. Horseradish, ketchup, and mustard. Worcestershire sauce. Teriyaki sauce, soy sauce (including reduced sodium). Hot sauce and Tabasco sauce. Steak sauce, fish sauce, oyster sauce, and cocktail sauce. Taco seasonings. Barbecue sauce. Tartar sauce. The items listed above may not be a complete list of foods and beverages you should avoid. Contact a dietitian for more information. Summary  A heart failure eating plan includes changes that limit your intake of sodium and unhealthy fat, and it may help you lose weight or maintain a healthy weight. Your health care provider may also recommend limiting how much fluid you drink.  Most people with heart failure should eat no more than 2,300 mg of salt (sodium) a day. The amount of sodium that is recommended for you may be lower, depending on your condition.  Contact your health care provider or dietitian before making any major changes to your diet. This information is not intended to replace advice given to you by your health care provider. Make sure you discuss any questions you have with your health care provider. Document Revised: 02/23/2020 Document Reviewed: 02/23/2020 Elsevier Patient Education  2021 [employer].

## 2020-11-24 NOTE — Progress Notes (Signed)
Cardiology Office Note:    Date:  11/24/2020   ID:  Marisa Livingston, DOB November 16, 1935, MRN [id number]  PCP:  Erven Colla, DO   [facility] Providers Cardiologist:  Candee Furbish, MD     Referring MD: Erven Colla, DO     History of Present Illness:    Marisa Livingston is a 85 y.o. female here for the follow-up of left bundle branch block, palpitations, shortness of breath with chronic systolic heart failure left ventricular ejection fraction of 30 to 35% overall low risk nuclear stress test.  She wore an event monitor that showed PVCs and PACs.  Previously on Toprol and losartan.  In review of prior office note on 09/02/2020 from Tommye Standard, she reported that her shortness of breath was better  Past Medical History:  Diagnosis Date  . Allergic rhinitis   . Hyperlipidemia   . Insomnia   . LBBB (left bundle branch block)   . Osteopenia     Past Surgical History:  Procedure Laterality Date  . COLONOSCOPY N/A 09/02/2015   Procedure: COLONOSCOPY;  Surgeon: Rogene Houston, MD;  Location: [facility];  Service: Endoscopy;  Laterality: N/A;  1025 - moved to 2/9 @ 8:30 - Ann notified pt  . POLYPECTOMY N/A 09/02/2015   Procedure: POLYPECTOMY;  Surgeon: Rogene Houston, MD;  Location: [facility];  Service: Endoscopy;  Laterality: N/A;  Hepatic Flexure Polyp    Current Medications: Current Meds  Medication Sig  . Calcium Citrate (CITRACAL PO) Take by mouth.  . Loratadine (CLARITIN PO) Take by mouth.  . metoprolol succinate (TOPROL-XL) 50 MG 24 hr tablet Take 1 tablet (50 mg total) by mouth daily. Take with or immediately following a meal.  . Multiple Vitamins-Minerals (MULTIVITAMIN WITH MINERALS) tablet Take 1 tablet by mouth daily.  . sacubitril-valsartan (ENTRESTO) 24-26 MG Take 1 tablet by mouth 2 (two) times daily.  . simvastatin (ZOCOR) 20 MG tablet TAKE ONE-HALF TABLET BY MOUTH DAILY     Allergies:   Codeine and Levaquin [levofloxacin in d5w]   Social History    Socioeconomic History  . Marital status: Married    Spouse name: Not on file  . Number of children: Not on file  . Years of education: Not on file  . Highest education level: Not on file  Occupational History  . Not on file  Tobacco Use  . Smoking status: Never Smoker  . Smokeless tobacco: Never Used  Vaping Use  . Vaping Use: Never used  Substance and Sexual Activity  . Alcohol use: No  . Drug use: No  . Sexual activity: Not Currently    Birth control/protection: Post-menopausal  Other Topics Concern  . Not on file  Social History Narrative   Lives in alone in [place]   Retired Biochemist, clinical for [employer] Strain: Not on [employer] Insecurity: Not on file  Transportation Needs: Not on file  Physical Activity: Not on file  Stress: Not on file  Social Connections: Not on file     Family History: The patient's family history includes Cancer in her brother; Heart Problems in her mother.  ROS:   Please see the history of present illness.    No syncope, no dizziness with medication all other systems reviewed and are negative.  EKGs/Labs/Other Studies Reviewed:    The following studies were reviewed today:   07/27/20: stress myoview  EKG not interpretable due  to LBBB  The left ventricular ejection fraction is mildly decreased (45-54%).  Nuclear stress EF: 51%.  Defect 1: There is a defect present in the basal inferoseptal, mid inferoseptal, apical septal, apical inferior and apex location.  Findings consistent with prior myocardial infarction.  This is a low risk study.  1. Fixed perfusion defect in basal to mid inferoseptal, apical septal, and apex, with hypokinesis in these regions, consistent with prior infarct. Given septal perfusion defects, could also be due to LBBB  2. No ischemia 3. Low risk study  07/27/20: ECHO IMPRESSIONS  1. Global hypokinesis (worse in the septum);  overall moderate to severe  LV dysfunction.  2. Left ventricular ejection fraction, by estimation, is 30 to 35%. The  left ventricle has moderately decreased function. The left ventricle  demonstrates regional wall motion abnormalities (see scoring  diagram/findings for description). Left ventricular  diastolic parameters are consistent with Grade II diastolic dysfunction  (pseudonormalization). Elevated left atrial pressure.  3. Right ventricular systolic function is normal. The right ventricular  size is normal.  4. The mitral valve is normal in structure. Trivial mitral valve  regurgitation. No evidence of mitral stenosis.  5. The aortic valve is tricuspid. Aortic valve regurgitation is trivial.  No aortic stenosis is present.    Jan 2022 monitoring Sinus rhythm Rare premature atrial contractions and rare premature ventricular contractions Baseline artifact frequently limits interpretation  EKG: Prior EKG showed left bundle branch block  Recent Labs: 06/01/2020: ALT 19; Hemoglobin 14.7; Platelets 282 09/16/2020: BUN 20; Creatinine, Ser 1.05; Potassium 4.3; Sodium 141  Recent Lipid Panel    Component Value Date/Time   CHOL 204 (H) 06/01/2020 0807   TRIG 164 (H) 06/01/2020 0807   HDL 66 06/01/2020 0807   CHOLHDL 3.1 06/01/2020 0807   CHOLHDL 3.1 07/25/2014 0810   VLDL 21 07/25/2014 0810   LDLCALC 110 (H) 06/01/2020 0807       Physical Exam:    VS:  BP 124/80 (BP Location: Left Arm, Patient Position: Sitting, Cuff Size: Normal)   Pulse 67   Ht 5\' 1"  (1.549 m)   Wt 175 lb (79.4 kg)   SpO2 97%   BMI 33.07 kg/m     Wt Readings from Last 3 Encounters:  11/24/20 175 lb (79.4 kg)  09/02/20 175 lb (79.4 kg)  08/03/20 175 lb (79.4 kg)     GEN:  Well nourished, well developed in no acute distress HEENT: Normal NECK: No JVD; No carotid bruits LYMPHATICS: No lymphadenopathy CARDIAC: RRR, no murmurs, rubs, gallops RESPIRATORY:  Clear to auscultation without  rales, wheezing or rhonchi  ABDOMEN: Soft, non-tender, non-distended MUSCULOSKELETAL:  No edema; No deformity  SKIN: Warm and dry NEUROLOGIC:  Alert and oriented x 3 PSYCHIATRIC:  Normal affect   ASSESSMENT:    1. Chronic systolic heart failure ([place])   2. LBBB (left bundle branch block)    PLAN:    In order of problems listed above:  Chronic systolic heart failure - EF 30 to 35% on echocardiogram, nuclear stress test however showed EF of 51%.  She had trouble titrating her medications her blood pressure was quite low.  Appreciate EP seeing her.  They did not think that there was a role for CRT pacing at this time.  Her volume status was much improved. -We started her on Entresto low-dose 24/26 mg.  Toprol 50 mg.  Her blood pressure today is reasonable 124/80.  In February her creatinine was 1.0 potassium 4.3  Left bundle branch  block - Chronic.  No syncope, no need for CRT.  Palpitations - Occasional PVCs PACs.  Seems to be doing quite well.  72-month follow-up    Medication Adjustments/Labs and Tests Ordered: Current medicines are reviewed at length with the patient today.  Concerns regarding medicines are outlined above.  Orders Placed This Encounter  Procedures  . ECHOCARDIOGRAM COMPLETE   No orders of the defined types were placed in this encounter.   Patient Instructions   Medication Instructions:  The current medical regimen is effective;  continue present plan and medications.  *If you need a refill on your cardiac medications before your next appointment, please call your pharmacy*  Testing/Procedures: Your physician has requested that you have an echocardiogram. Echocardiography is a painless test that uses sound waves to create images of your heart. It provides your doctor with information about the size and shape of your heart and how well your heart's chambers and valves are working. This procedure takes approximately one hour. There are no restrictions for  this procedure.  Follow-Up: At [facility], you and your health needs are our priority.  As part of our continuing mission to provide you with exceptional heart care, we have created designated Provider Care Teams.  These Care Teams include your primary Cardiologist (physician) and Advanced Practice Providers (APPs -  Physician Assistants and Nurse Practitioners) who all work together to provide you with the care you need, when you need it.  We recommend signing up for the patient portal called "MyChart".  Sign up information is provided on this After Visit Summary.  MyChart is used to connect with patients for Virtual Visits (Telemedicine).  Patients are able to view lab/test results, encounter notes, upcoming appointments, etc.  Non-urgent messages can be sent to your provider as well.   To learn more about what you can do with MyChart, go to [email or web address].    Your next appointment:   6 month(s)  The format for your next appointment:   In Person  Provider:   Candee Furbish, MD   Thank you for choosing [facility]!!     Heart Failure Eating Plan Heart failure, also called congestive heart failure, occurs when your heart does not pump blood well enough to meet your body's needs for oxygen-rich blood. Heart failure is a long-term (chronic) condition. Living with heart failure can be challenging. Following your health care provider's instructions about a healthy lifestyle and working with a dietitian to choose the right foods may help to improve your symptoms. An eating plan for someone with heart failure will include changes that limit the intake of salt (sodium) and unhealthy fat. What are tips for following this plan? Reading food labels  Check food labels for the amount of sodium per serving. Choose foods that have less than 140 mg (milligrams) of sodium in each serving.  Check food labels for the number of calories per serving. This is important if you need to  limit your daily calorie intake to lose weight.  Check food labels for the serving size. If you eat more than one serving, you will be eating more sodium and calories than what is listed on the label.  Look for foods that are labeled as "sodium-free," "very low sodium," or "low sodium." ? Foods labeled as "reduced sodium" or "lightly salted" may still have more sodium than what is recommended for you. Cooking  Avoid adding salt when cooking. Ask your health care provider or dietitian before using salt substitutes.  Season  food with salt-free seasonings, spices, or herbs. Check the label of seasoning mixes to make sure they do not contain salt.  Cook with heart-healthy oils, such as olive, canola, soybean, or sunflower oil.  Do not fry foods. Cook foods using low-fat methods, such as baking, boiling, grilling, and broiling.  Limit unhealthy fats when cooking by: ? Removing the skin from poultry, such as chicken. ? Removing all visible fats from meats. ? Skimming the fat off from stews, soups, and gravies before serving them. Meal planning  Limit your intake of: ? Processed, canned, or prepackaged foods. ? Foods that are high in trans fat, such as fried foods. ? Sweets, desserts, sugary drinks, and other foods with added sugar. ? Full-fat dairy products, such as whole milk.  Eat a balanced diet. This may include: ? 4-5 servings of fruit each day and 4-5 servings of vegetables each day. At each meal, try to fill one-half of your plate with fruits and vegetables. ? Up to 6-8 servings of whole grains each day. ? Up to 2 servings of lean meat, poultry, or fish each day. One serving of meat is equal to 3 oz (85 g). This is about the same size as a deck of cards. ? 2 servings of low-fat dairy each day. ? Heart-healthy fats. Healthy fats called omega-3 fatty acids are found in foods such as flaxseed and cold-water fish like sardines, salmon, and mackerel.  Aim to eat 25-35 g (grams) of  fiber a day. Foods that are high in fiber include apples, broccoli, carrots, beans, peas, and whole grains.  Do not add salt or condiments that contain salt (such as soy sauce) to foods before eating.  When eating at a restaurant, ask that your food be prepared with less salt or no salt, if possible.  Try to eat 2 or more vegetarian meals each week.  Eat more home-cooked food and eat less restaurant, buffet, and fast food.   General information  Do not eat more than 2,300 mg of sodium a day. The amount of sodium that is recommended for you may be lower, depending on your condition.  Maintain a healthy body weight as directed. Ask your health care provider what a healthy weight is for you. ? Check your weight every day. ? Work with your health care provider and dietitian to make a plan that is right for you to lose weight or maintain your current weight.  Limit how much fluid you drink. Ask your health care provider or dietitian how much fluid you can have each day.  Limit or avoid alcohol as told by your health care provider or dietitian. Recommended foods Fruits All fresh, frozen, and canned fruits. Dried fruits, such as raisins, prunes, and cranberries. Vegetables All fresh vegetables. Vegetables that are frozen without sauce or added salt. Low-sodium or sodium-free canned vegetables. Grains Bread with less than 80 mg of sodium per slice. Whole-wheat pasta, quinoa, and brown rice. Oats and oatmeal. Barley. Bowdle. Grits and cream of wheat. Whole-grain and whole-wheat cold cereal. Meats and other protein foods Lean cuts of meat. Skinless chicken and [place]. Fish with high omega-3 fatty acids, such as salmon, sardines, and other cold-water fishes. Eggs. Dried beans, peas, and edamame. Unsalted nuts and nut butters. Dairy Low-fat or nonfat (skim) milk and dried milk. Rice milk, soy milk, and almond milk. Low-fat or nonfat yogurt. Small amounts of reduced-sodium block cheese. Low-sodium  cottage cheese. Fats and oils Olive, canola, soybean, flaxseed, avocado, or sunflower oil. Sweets  and desserts Applesauce. Granola bars. Sugar-free pudding and gelatin. Frozen fruit bars. Seasoning and other foods Fresh and dried herbs. Lemon or lime juice. Vinegar. Low-sodium ketchup. Salt-free marinades, salad dressings, sauces, and seasonings. The items listed above may not be a complete list of foods and beverages you can eat. Contact a dietitian for more information. Foods to avoid Fruits Fruits that are dried with sodium-containing preservatives. Vegetables Canned vegetables. Frozen vegetables with sauce or seasonings. Creamed vegetables. [place] fries. Onion rings. Pickled vegetables and sauerkraut. Grains Bread with more than 80 mg of sodium per slice. Hot or cold cereal with more than 140 mg sodium per serving. Salted pretzels and crackers. Prepackaged breadcrumbs. Bagels, croissants, and biscuits. Meats and other protein foods Ribs and chicken wings. Bacon, ham, pepperoni, bologna, salami, and packaged luncheon meats. Hot dogs, bratwurst, and sausage. Canned meat. Smoked meat and fish. Salted nuts and seeds. Dairy Whole milk, half-and-half, and cream. Buttermilk. Processed cheese, cheese spreads, and cheese curds. Regular cottage cheese. Feta cheese. Shredded cheese. String cheese. Fats and oils Butter, lard, shortening, ghee, and bacon fat. Canned and packaged gravies. Seasoning and other foods Onion salt, garlic salt, table salt, and sea salt. Marinades. Regular salad dressings. Relishes, pickles, and olives. Meat flavorings and tenderizers, and bouillon cubes. Horseradish, ketchup, and mustard. Worcestershire sauce. Teriyaki sauce, soy sauce (including reduced sodium). Hot sauce and Tabasco sauce. Steak sauce, fish sauce, oyster sauce, and cocktail sauce. Taco seasonings. Barbecue sauce. Tartar sauce. The items listed above may not be a complete list of foods and beverages you  should avoid. Contact a dietitian for more information. Summary  A heart failure eating plan includes changes that limit your intake of sodium and unhealthy fat, and it may help you lose weight or maintain a healthy weight. Your health care provider may also recommend limiting how much fluid you drink.  Most people with heart failure should eat no more than 2,300 mg of salt (sodium) a day. The amount of sodium that is recommended for you may be lower, depending on your condition.  Contact your health care provider or dietitian before making any major changes to your diet. This information is not intended to replace advice given to you by your health care provider. Make sure you discuss any questions you have with your health care provider. Document Revised: 02/23/2020 Document Reviewed: 02/23/2020 Elsevier Patient Education  2021 [place].     Signed, Candee Furbish, MD  11/24/2020 10:25 AM    [place]

## 2020-11-29 ENCOUNTER — Telehealth: Payer: Self-pay

## 2020-11-29 NOTE — Telephone Encounter (Signed)
Last seen in office 05/2020 per drs notes she needed a follow up in 6 months for follow up .

## 2020-11-29 NOTE — Telephone Encounter (Signed)
Pt wants blood work done in order to get refill  on simvastatin (ZOCOR) 20 MG tablet [place], [place]   Pt call back [phone]

## 2020-11-30 ENCOUNTER — Other Ambulatory Visit: Payer: Self-pay | Admitting: *Deleted

## 2020-11-30 DIAGNOSIS — E785 Hyperlipidemia, unspecified: Secondary | ICD-10-CM

## 2020-11-30 DIAGNOSIS — Z79899 Other long term (current) drug therapy: Secondary | ICD-10-CM

## 2020-11-30 NOTE — Telephone Encounter (Signed)
I have called and left message for 6 month follow up but she will need blood work ordered.

## 2020-11-30 NOTE — Telephone Encounter (Signed)
06/09/20 bmp was last bw.

## 2020-11-30 NOTE — Telephone Encounter (Signed)
Orders put in and pt was notified. States she will call back schedule visit

## 2020-11-30 NOTE — Telephone Encounter (Signed)
Pls order cbc, cmp, lipids. Thx. D.r Chord Takahashi  

## 2020-12-01 ENCOUNTER — Telehealth: Payer: Self-pay

## 2020-12-01 NOTE — Telephone Encounter (Signed)
**Note De-Identified Marisa Livingston Obfuscation** The pt left her completed [employer] pt asst application at the office with documents.  I have completed the provider page of the application and emailed all to Dr Kingsley Plan nurse so she can obtain his signature, date it and to fax all to [employer] at fax number written on cover letter included or to place in the nurses box in Medical Records to be faxed.

## 2020-12-02 NOTE — Telephone Encounter (Signed)
Received email from [place], LPN of pt's [employer] pt asst application.  Printed and given to Dr Marlou Porch to sign/date.

## 2020-12-02 NOTE — Telephone Encounter (Signed)
Paperwork signed and taken to MR to be faxed. 

## 2020-12-03 ENCOUNTER — Other Ambulatory Visit: Payer: Self-pay | Admitting: Family Medicine

## 2020-12-03 DIAGNOSIS — Z79899 Other long term (current) drug therapy: Secondary | ICD-10-CM | POA: Diagnosis not present

## 2020-12-03 DIAGNOSIS — E785 Hyperlipidemia, unspecified: Secondary | ICD-10-CM | POA: Diagnosis not present

## 2020-12-04 LAB — CBC WITH DIFFERENTIAL/PLATELET
Basophils Absolute: 0.1 10*3/uL (ref 0.0–0.2)
Basos: 1 %
EOS (ABSOLUTE): 0.1 10*3/uL (ref 0.0–0.4)
Eos: 2 %
Hematocrit: 41 % (ref 34.0–46.6)
Hemoglobin: 13.3 g/dL (ref 11.1–15.9)
Immature Grans (Abs): 0 10*3/uL (ref 0.0–0.1)
Immature Granulocytes: 0 %
Lymphocytes Absolute: 2.1 10*3/uL (ref 0.7–3.1)
Lymphs: 34 %
MCH: 28.4 pg (ref 26.6–33.0)
MCHC: 32.4 g/dL (ref 31.5–35.7)
MCV: 87 fL (ref 79–97)
Monocytes Absolute: 0.6 10*3/uL (ref 0.1–0.9)
Monocytes: 9 %
Neutrophils Absolute: 3.4 10*3/uL (ref 1.4–7.0)
Neutrophils: 54 %
Platelets: 275 10*3/uL (ref 150–450)
RBC: 4.69 x10E6/uL (ref 3.77–5.28)
RDW: 13.2 % (ref 11.7–15.4)
WBC: 6.3 10*3/uL (ref 3.4–10.8)

## 2020-12-04 LAB — COMPREHENSIVE METABOLIC PANEL
ALT: 16 IU/L (ref 0–32)
AST: 21 IU/L (ref 0–40)
Albumin/Globulin Ratio: 1.7 (ref 1.2–2.2)
Albumin: 4.5 g/dL (ref 3.6–4.6)
Alkaline Phosphatase: 55 IU/L (ref 44–121)
BUN/Creatinine Ratio: 16 (ref 12–28)
BUN: 19 mg/dL (ref 8–27)
Bilirubin Total: 0.5 mg/dL (ref 0.0–1.2)
CO2: 23 mmol/L (ref 20–29)
Calcium: 10.3 mg/dL (ref 8.7–10.3)
Chloride: 100 mmol/L (ref 96–106)
Creatinine, Ser: 1.21 mg/dL — ABNORMAL HIGH (ref 0.57–1.00)
Globulin, Total: 2.6 g/dL (ref 1.5–4.5)
Glucose: 94 mg/dL (ref 65–99)
Potassium: 4.6 mmol/L (ref 3.5–5.2)
Sodium: 139 mmol/L (ref 134–144)
Total Protein: 7.1 g/dL (ref 6.0–8.5)
eGFR: 45 mL/min/{1.73_m2} — ABNORMAL LOW (ref >59)

## 2020-12-04 LAB — LIPID PANEL
Chol/HDL Ratio: 2.8 ratio (ref 0.0–4.4)
Cholesterol, Total: 191 mg/dL (ref 100–199)
HDL: 68 mg/dL (ref >39)
LDL Chol Calc (NIH): 100 mg/dL — ABNORMAL HIGH (ref 0–99)
Triglycerides: 133 mg/dL (ref 0–149)
VLDL Cholesterol Cal: 23 mg/dL (ref 5–40)

## 2020-12-09 NOTE — Telephone Encounter (Signed)
**Note De-Identified Marisa Livingston Obfuscation** Letterreceived Marisa Livingston fax from [place] they deniedthe pt asst for his Entresto at this time. Reason: Prescription drug coverage exists. Pt ID: [id number]  The letter states that they have notified the pt of this denial as well.

## 2020-12-15 ENCOUNTER — Ambulatory Visit (INDEPENDENT_AMBULATORY_CARE_PROVIDER_SITE_OTHER): Payer: PPO | Admitting: Family Medicine

## 2020-12-15 ENCOUNTER — Telehealth: Payer: Self-pay | Admitting: Cardiology

## 2020-12-15 ENCOUNTER — Encounter: Payer: Self-pay | Admitting: Family Medicine

## 2020-12-15 ENCOUNTER — Other Ambulatory Visit: Payer: Self-pay

## 2020-12-15 VITALS — BP 154/76 | HR 71 | Temp 97.1°F | Ht 61.0 in | Wt 177.0 lb

## 2020-12-15 DIAGNOSIS — N183 Chronic kidney disease, stage 3 unspecified: Secondary | ICD-10-CM

## 2020-12-15 DIAGNOSIS — I5022 Chronic systolic (congestive) heart failure: Secondary | ICD-10-CM | POA: Diagnosis not present

## 2020-12-15 DIAGNOSIS — E785 Hyperlipidemia, unspecified: Secondary | ICD-10-CM

## 2020-12-15 NOTE — Telephone Encounter (Signed)
Patient called wanting to speak with the nurse in regards to medication entresto, stated that her pcp stated need mention it to her heart dr. Please advise

## 2020-12-15 NOTE — Patient Instructions (Addendum)
Creatinine increased 1.2, was at 1.05 in 2/22.  Review with heart doctor if needing to make any changes to your medications since on the [facility].  Lab Results  Component Value Date   CREATININE 1.21 (H) 12/03/2020

## 2020-12-15 NOTE — Telephone Encounter (Signed)
Spoke with pt and has concerns re recent lab "creatinine " 1.21 and in February value was 1.05 .Per pt has been reading side effects on Entresto and this is on the list as a potential side effect Will forward to Dr Marlou Porch for review and recommendations ./cy

## 2020-12-15 NOTE — Progress Notes (Signed)
Patient ID: Marisa Livingston, female    DOB: 08/23/1935, 85 y.o.   MRN: [id number]   Chief Complaint  Patient presents with   Hyperlipidemia    Follow up lab results   Hypertension   Subjective:    HPI Pt seen for f/u hld and CHF- No issues with meds.  Slight increase in cr.  Was 1.05 and 1.21, was dx with ckd 3 in 2019.   CHF- taking metoprolol and entresto.  Seeing cardiology.    Had both medications for blood pressure today.  entresto at 8am, Metoprolol- at 8:30am  Getting tx with cardiology and tx with systolic HF. Dr. Marlou Porch  HLD- doing well no new concerns.  Compliant with meds. No chest pain, palpitations, myalgias or joint pains.  Medical History Marisa Livingston has a past medical history of Allergic rhinitis, Hyperlipidemia, Insomnia, LBBB (left bundle branch block), and Osteopenia.   Outpatient Encounter Medications as of 12/15/2020  Medication Sig   Calcium Citrate (CITRACAL PO) Take by mouth.   Loratadine (CLARITIN PO) Take by mouth.   metoprolol succinate (TOPROL-XL) 50 MG 24 hr tablet Take 1 tablet (50 mg total) by mouth daily. Take with or immediately following a meal.   Multiple Vitamins-Minerals (MULTIVITAMIN WITH MINERALS) tablet Take 1 tablet by mouth daily.   sacubitril-valsartan (ENTRESTO) 24-26 MG Take 1 tablet by mouth 2 (two) times daily.   simvastatin (ZOCOR) 20 MG tablet TAKE ONE-HALF TABLET BY MOUTH DAILY   No facility-administered encounter medications on file as of 12/15/2020.     Review of Systems  Constitutional:  Negative for chills and fever.  HENT:  Negative for congestion, rhinorrhea and sore throat.   Respiratory:  Negative for cough, shortness of breath and wheezing.   Cardiovascular:  Negative for chest pain and leg swelling.  Gastrointestinal:  Negative for abdominal pain, diarrhea, nausea and vomiting.  Genitourinary:  Negative for dysuria and frequency.  Musculoskeletal:  Negative for arthralgias and back pain.  Skin:  Negative  for rash.  Neurological:  Negative for dizziness, weakness and headaches.    Vitals BP (!) 154/76   Pulse 71   Temp (!) 97.1 F (36.2 C)   Ht 5\' 1"  (1.549 m)   Wt 177 lb (80.3 kg)   SpO2 98%   BMI 33.44 kg/m   Objective:   Physical Exam Vitals and nursing note reviewed.  Constitutional:      General: She is not in acute distress.    Appearance: Normal appearance.  HENT:     Head: Normocephalic and atraumatic.     Nose: Nose normal.     Mouth/Throat:     Mouth: Mucous membranes are moist.     Pharynx: Oropharynx is clear.  Eyes:     Extraocular Movements: Extraocular movements intact.     Conjunctiva/sclera: Conjunctivae normal.     Pupils: Pupils are equal, round, and reactive to light.  Cardiovascular:     Rate and Rhythm: Normal rate and regular rhythm.     Pulses: Normal pulses.     Heart sounds: Normal heart sounds.  Pulmonary:     Effort: Pulmonary effort is normal. No respiratory distress.     Breath sounds: Normal breath sounds. No wheezing, rhonchi or rales.  Musculoskeletal:        General: Normal range of motion.     Right lower leg: No edema.     Left lower leg: No edema.  Skin:    General: Skin is warm and dry.  Findings: No lesion or rash.  Neurological:     General: No focal deficit present.     Mental Status: She is alert and oriented to person, place, and time.  Psychiatric:        Mood and Affect: Mood normal.        Behavior: Behavior normal.     Assessment and Plan   1. Chronic systolic congestive heart failure ([place])  2. Hyperlipidemia LDL goal <130  3. Stage 3 chronic kidney disease, unspecified whether stage 3a or 3b CKD (HCC)   CHF, hld, stage 3 ckd- Advising pt to call her cardiologist to review the Cr since increased from 2/22 from 1.05 to 1.21. Pt started new medication Entresto. Has echo in 6/22 coming up.  Blood pressure- slight elevated at 154/76, last visit with cardiology on 11/24/20, had normal bp.  Cont to  monitor.  Return in about 6 months (around 06/17/2021) for f/u htn .   01/01/2021  BP Readings from Last 3 Encounters:  12/23/20 138/78  12/15/20 (!) 154/76  11/24/20 124/80

## 2020-12-16 NOTE — Telephone Encounter (Signed)
Pt aware of Dr Kingsley Plan response and is agreeable with plan ./cy

## 2020-12-16 NOTE — Telephone Encounter (Signed)
This is a very mild and expected increase in creatinine while on Entresto.  We see this quite commonly.  Continue the medication.  We will continue to track kidney function. Candee Furbish, MD

## 2020-12-23 ENCOUNTER — Encounter: Payer: Self-pay | Admitting: Family Medicine

## 2020-12-23 ENCOUNTER — Ambulatory Visit (INDEPENDENT_AMBULATORY_CARE_PROVIDER_SITE_OTHER): Payer: PPO | Admitting: Family Medicine

## 2020-12-23 ENCOUNTER — Other Ambulatory Visit: Payer: Self-pay

## 2020-12-23 VITALS — BP 138/78 | HR 100 | Temp 95.5°F | Ht 61.5 in | Wt 175.4 lb

## 2020-12-23 DIAGNOSIS — Z0000 Encounter for general adult medical examination without abnormal findings: Secondary | ICD-10-CM

## 2020-12-23 DIAGNOSIS — E785 Hyperlipidemia, unspecified: Secondary | ICD-10-CM

## 2020-12-23 DIAGNOSIS — Z0001 Encounter for general adult medical examination with abnormal findings: Secondary | ICD-10-CM

## 2020-12-23 DIAGNOSIS — R000 Tachycardia, unspecified: Secondary | ICD-10-CM

## 2020-12-23 DIAGNOSIS — I5022 Chronic systolic (congestive) heart failure: Secondary | ICD-10-CM | POA: Diagnosis not present

## 2020-12-23 DIAGNOSIS — N183 Chronic kidney disease, stage 3 unspecified: Secondary | ICD-10-CM | POA: Diagnosis not present

## 2020-12-23 DIAGNOSIS — M810 Age-related osteoporosis without current pathological fracture: Secondary | ICD-10-CM | POA: Diagnosis not present

## 2020-12-23 NOTE — Progress Notes (Signed)
Patient ID: Marisa Livingston, female    DOB: Dec 27, 1935, 85 y.o.   MRN: [id number]   Chief Complaint  Patient presents with   Annual Exam   Subjective:    HPI AWV- Annual Wellness Visit  The patient was seen for their annual wellness visit. The patient's past medical history, surgical history, and family history were reviewed. Pertinent vaccines were reviewed ( tetanus, pneumonia, shingles, flu) The patient's medication list was reviewed and updated.  The height and weight were entered.  BMI recorded in electronic record elsewhere  Cognitive screening was completed. Outcome of Mini - Cog: pass   Falls /depression screening electronically recorded within record elsewhere  Current tobacco usage: none (All patients who use tobacco were given written and verbal information on quitting)  Recent listing of emergency department/hospitalizations over the past year were reviewed.  current specialist the patient sees on a regular basis:  Cardiology   Medicare annual wellness visit patient questionnaire was reviewed.  A written screening schedule for the patient for the next 5-10 years was given. Appropriate discussion of followup regarding next visit was discussed.  Checking bp at home seeing 364-680 systolic, then last night had systolic 321/22.  Normally been better. This am- 122/64.  Hr around 64-70 at home.  Cr slight elevated at 1.21.  Keep taking Malvin Johns, per cardiology for CHF.  And they would recheck it on next visit.   Medical History Marisa Livingston has a past medical history of Allergic rhinitis, Hyperlipidemia, Insomnia, LBBB (left bundle branch block), and Osteopenia.   Outpatient Encounter Medications as of 12/23/2020  Medication Sig   Calcium Citrate (CITRACAL PO) Take by mouth.   Loratadine (CLARITIN PO) Take by mouth.   metoprolol succinate (TOPROL-XL) 50 MG 24 hr tablet Take 1 tablet (50 mg total) by mouth daily. Take with or immediately following a meal.    Multiple Vitamins-Minerals (MULTIVITAMIN WITH MINERALS) tablet Take 1 tablet by mouth daily.   sacubitril-valsartan (ENTRESTO) 24-26 MG Take 1 tablet by mouth 2 (two) times daily.   simvastatin (ZOCOR) 20 MG tablet TAKE ONE-HALF TABLET BY MOUTH DAILY   No facility-administered encounter medications on file as of 12/23/2020.     Review of Systems  Constitutional:  Negative for chills and fever.  HENT:  Negative for congestion, rhinorrhea and sore throat.   Respiratory:  Negative for cough, shortness of breath and wheezing.   Cardiovascular:  Negative for chest pain and leg swelling.  Gastrointestinal:  Negative for abdominal pain, diarrhea, nausea and vomiting.  Genitourinary:  Negative for dysuria and frequency.  Musculoskeletal:  Negative for arthralgias and back pain.  Skin:  Negative for rash.  Neurological:  Negative for dizziness, weakness and headaches.    Vitals BP 138/78   Pulse 100   Temp (!) 95.5 F (35.3 C)   Ht 5' 1.5" (1.562 m)   Wt 175 lb 6.4 oz (79.6 kg)   SpO2 (!) 82%   BMI 32.60 kg/m   Objective:   Physical Exam Vitals and nursing note reviewed.  Constitutional:      General: She is not in acute distress.    Appearance: Normal appearance.  HENT:     Head: Normocephalic and atraumatic.  Eyes:     Extraocular Movements: Extraocular movements intact.     Conjunctiva/sclera: Conjunctivae normal.     Pupils: Pupils are equal, round, and reactive to light.  Cardiovascular:     Rate and Rhythm: Regular rhythm. Tachycardia present.     Pulses: Normal pulses.  Heart sounds: Normal heart sounds. No murmur heard. Pulmonary:     Effort: Pulmonary effort is normal.     Breath sounds: Normal breath sounds. No wheezing, rhonchi or rales.  Musculoskeletal:        General: Normal range of motion.     Right lower leg: No edema.     Left lower leg: No edema.  Skin:    General: Skin is warm and dry.     Findings: No lesion or rash.  Neurological:     General:  No focal deficit present.     Mental Status: She is alert and oriented to person, place, and time.  Psychiatric:        Mood and Affect: Mood normal.        Behavior: Behavior normal.     Assessment and Plan   1. Medicare annual wellness visit, subsequent  2. Postmenopausal osteoporosis - DG Bone Density  3. Tachycardia  4. Hyperlipidemia LDL goal <130  5. Chronic systolic congestive heart failure (HCC)  6. Stage 3 chronic kidney disease, unspecified whether stage 3a or 3b CKD (HCC)    HM- ordered dexa scan.  Tachycardia- seen on last couple visits.  Pt is very anxious in room.  Will cont to monitor. Pt on metoprolol 50mg  xr. Seeing cardiology -Dr. Marlou Porch.  HLD- stable. Labs reviewed. Cont meds.  H/o chf- stable.  Seeing cardiology, cont meds. Cont with Entresto and metoprolol.  Ckd stage 3- stable.  Cont to monitor.   Return in about 6 months (around 06/24/2021) for f//u chf, hld, ckd.

## 2020-12-28 ENCOUNTER — Other Ambulatory Visit: Payer: Self-pay

## 2020-12-28 ENCOUNTER — Ambulatory Visit (HOSPITAL_COMMUNITY): Payer: PPO | Attending: Internal Medicine

## 2020-12-28 DIAGNOSIS — I5022 Chronic systolic (congestive) heart failure: Secondary | ICD-10-CM | POA: Insufficient documentation

## 2020-12-28 LAB — ECHOCARDIOGRAM COMPLETE
Area-P 1/2: 3.42 cm2
S' Lateral: 3.3 cm

## 2020-12-29 ENCOUNTER — Telehealth: Payer: Self-pay | Admitting: Cardiology

## 2020-12-29 NOTE — Telephone Encounter (Signed)
Patient would like someone to discuss her Echo results with her

## 2020-12-29 NOTE — Telephone Encounter (Signed)
Informed pt of results. Pt verbalized understanding. 

## 2021-01-04 DIAGNOSIS — H5213 Myopia, bilateral: Secondary | ICD-10-CM | POA: Diagnosis not present

## 2021-01-04 DIAGNOSIS — Z961 Presence of intraocular lens: Secondary | ICD-10-CM | POA: Diagnosis not present

## 2021-01-05 ENCOUNTER — Ambulatory Visit (HOSPITAL_COMMUNITY)
Admission: RE | Admit: 2021-01-05 | Discharge: 2021-01-05 | Disposition: A | Discharge status: Home / Self Care | Payer: PPO | Source: Ambulatory Visit | Attending: Family Medicine | Admitting: Family Medicine

## 2021-01-05 DIAGNOSIS — M810 Age-related osteoporosis without current pathological fracture: Secondary | ICD-10-CM | POA: Diagnosis not present

## 2021-01-05 DIAGNOSIS — M85852 Other specified disorders of bone density and structure, left thigh: Secondary | ICD-10-CM | POA: Diagnosis not present

## 2021-01-05 DIAGNOSIS — M85832 Other specified disorders of bone density and structure, left forearm: Secondary | ICD-10-CM | POA: Diagnosis not present

## 2021-01-05 DIAGNOSIS — Z780 Asymptomatic menopausal state: Secondary | ICD-10-CM | POA: Diagnosis not present

## 2021-01-09 ENCOUNTER — Encounter: Payer: Self-pay | Admitting: Family Medicine

## 2021-01-12 ENCOUNTER — Encounter: Payer: Self-pay | Admitting: Family Medicine

## 2021-01-12 DIAGNOSIS — M8580 Other specified disorders of bone density and structure, unspecified site: Secondary | ICD-10-CM | POA: Insufficient documentation

## 2021-02-23 ENCOUNTER — Other Ambulatory Visit (HOSPITAL_COMMUNITY): Payer: Self-pay | Admitting: Family Medicine

## 2021-02-23 DIAGNOSIS — Z1231 Encounter for screening mammogram for malignant neoplasm of breast: Secondary | ICD-10-CM

## 2021-02-24 ENCOUNTER — Telehealth: Payer: Self-pay | Admitting: Family Medicine

## 2021-02-24 NOTE — Telephone Encounter (Signed)
Please advise. Thank you

## 2021-02-24 NOTE — Telephone Encounter (Signed)
Patient states she was suppose to have colonoscopy but her heart doctor advised her not to but to do the color card instead. Please advise

## 2021-02-25 NOTE — Telephone Encounter (Signed)
Attempting to contacted patient; no voicemail set up at this time.

## 2021-02-25 NOTE — Telephone Encounter (Signed)
Hemoccult cards mailed to patient

## 2021-02-28 NOTE — Telephone Encounter (Signed)
Tried to contact patient; no voicemail set up at this time. Sent my chart message to patient.

## 2021-03-02 NOTE — Telephone Encounter (Signed)
Contacted patient but had issues with phone (not able to hear patient and pt not able to hear me)

## 2021-03-07 NOTE — Telephone Encounter (Signed)
Unable to get in contact with patient. Left voicemail to return call. May we close message? Please advise. Thank you

## 2021-03-14 ENCOUNTER — Other Ambulatory Visit: Payer: Self-pay | Admitting: Family Medicine

## 2021-03-14 ENCOUNTER — Ambulatory Visit (HOSPITAL_COMMUNITY)
Admission: RE | Admit: 2021-03-14 | Discharge: 2021-03-14 | Disposition: A | Discharge status: Home / Self Care | Payer: PPO | Source: Ambulatory Visit | Attending: Family Medicine | Admitting: Family Medicine

## 2021-03-14 ENCOUNTER — Other Ambulatory Visit: Payer: Self-pay

## 2021-03-14 DIAGNOSIS — Z1211 Encounter for screening for malignant neoplasm of colon: Secondary | ICD-10-CM

## 2021-03-14 DIAGNOSIS — Z1231 Encounter for screening mammogram for malignant neoplasm of breast: Secondary | ICD-10-CM | POA: Diagnosis not present

## 2021-03-14 LAB — POC HEMOCCULT BLD/STL (HOME/3-CARD/SCREEN)
Card #2 Fecal Occult Blod, POC: NEGATIVE
Card #3 Fecal Occult Blood, POC: NEGATIVE
Fecal Occult Blood, POC: NEGATIVE

## 2021-03-14 NOTE — Telephone Encounter (Signed)
Pt dropped off hemoccult cards and results in Epic. Please advise. Thank you

## 2021-03-15 NOTE — Telephone Encounter (Signed)
Pt contacted and verbalized understanding.  

## 2021-04-04 ENCOUNTER — Other Ambulatory Visit: Payer: Self-pay | Admitting: Cardiology

## 2021-04-04 DIAGNOSIS — L821 Other seborrheic keratosis: Secondary | ICD-10-CM | POA: Diagnosis not present

## 2021-04-04 DIAGNOSIS — Z85828 Personal history of other malignant neoplasm of skin: Secondary | ICD-10-CM | POA: Diagnosis not present

## 2021-04-04 DIAGNOSIS — L814 Other melanin hyperpigmentation: Secondary | ICD-10-CM | POA: Diagnosis not present

## 2021-04-04 DIAGNOSIS — L570 Actinic keratosis: Secondary | ICD-10-CM | POA: Diagnosis not present

## 2021-04-04 DIAGNOSIS — C44719 Basal cell carcinoma of skin of left lower limb, including hip: Secondary | ICD-10-CM | POA: Diagnosis not present

## 2021-04-28 DIAGNOSIS — E782 Mixed hyperlipidemia: Secondary | ICD-10-CM | POA: Diagnosis not present

## 2021-04-28 DIAGNOSIS — I10 Essential (primary) hypertension: Secondary | ICD-10-CM | POA: Diagnosis not present

## 2021-04-28 DIAGNOSIS — Z23 Encounter for immunization: Secondary | ICD-10-CM | POA: Diagnosis not present

## 2021-04-28 DIAGNOSIS — Z0189 Encounter for other specified special examinations: Secondary | ICD-10-CM | POA: Diagnosis not present

## 2021-04-28 DIAGNOSIS — I509 Heart failure, unspecified: Secondary | ICD-10-CM | POA: Diagnosis not present

## 2021-05-09 DIAGNOSIS — I10 Essential (primary) hypertension: Secondary | ICD-10-CM | POA: Diagnosis not present

## 2021-05-12 DIAGNOSIS — N1831 Chronic kidney disease, stage 3a: Secondary | ICD-10-CM | POA: Diagnosis not present

## 2021-05-12 DIAGNOSIS — Z0001 Encounter for general adult medical examination with abnormal findings: Secondary | ICD-10-CM | POA: Diagnosis not present

## 2021-05-12 DIAGNOSIS — E782 Mixed hyperlipidemia: Secondary | ICD-10-CM | POA: Diagnosis not present

## 2021-05-12 DIAGNOSIS — I509 Heart failure, unspecified: Secondary | ICD-10-CM | POA: Diagnosis not present

## 2021-05-12 DIAGNOSIS — I10 Essential (primary) hypertension: Secondary | ICD-10-CM | POA: Diagnosis not present

## 2021-05-31 ENCOUNTER — Ambulatory Visit: Payer: PPO | Admitting: Cardiology

## 2021-05-31 ENCOUNTER — Other Ambulatory Visit: Payer: Self-pay

## 2021-05-31 ENCOUNTER — Encounter: Payer: Self-pay | Admitting: Cardiology

## 2021-05-31 VITALS — BP 120/70 | HR 63 | Ht 61.5 in | Wt 181.0 lb

## 2021-05-31 DIAGNOSIS — E785 Hyperlipidemia, unspecified: Secondary | ICD-10-CM | POA: Diagnosis not present

## 2021-05-31 DIAGNOSIS — N1831 Chronic kidney disease, stage 3a: Secondary | ICD-10-CM

## 2021-05-31 DIAGNOSIS — I5022 Chronic systolic (congestive) heart failure: Secondary | ICD-10-CM

## 2021-05-31 DIAGNOSIS — I447 Left bundle-branch block, unspecified: Secondary | ICD-10-CM

## 2021-05-31 DIAGNOSIS — Z79899 Other long term (current) drug therapy: Secondary | ICD-10-CM

## 2021-05-31 LAB — CBC
Hematocrit: 39.1 % (ref 34.0–46.6)
Hemoglobin: 13.2 g/dL (ref 11.1–15.9)
MCH: 29.7 pg (ref 26.6–33.0)
MCHC: 33.8 g/dL (ref 31.5–35.7)
MCV: 88 fL (ref 79–97)
Platelets: 256 10*3/uL (ref 150–450)
RBC: 4.45 x10E6/uL (ref 3.77–5.28)
RDW: 12.8 % (ref 11.7–15.4)
WBC: 6.3 10*3/uL (ref 3.4–10.8)

## 2021-05-31 LAB — BASIC METABOLIC PANEL
BUN/Creatinine Ratio: 15 (ref 12–28)
BUN: 16 mg/dL (ref 8–27)
CO2: 25 mmol/L (ref 20–29)
Calcium: 9.9 mg/dL (ref 8.7–10.3)
Chloride: 103 mmol/L (ref 96–106)
Creatinine, Ser: 1.09 mg/dL — ABNORMAL HIGH (ref 0.57–1.00)
Glucose: 88 mg/dL (ref 70–99)
Potassium: 4.9 mmol/L (ref 3.5–5.2)
Sodium: 141 mmol/L (ref 134–144)
eGFR: 51 mL/min/{1.73_m2} — ABNORMAL LOW (ref >59)

## 2021-05-31 NOTE — Assessment & Plan Note (Signed)
Mildly increased creatinine.  Knows to avoid NSAIDs.  I expect a subtle increase in creatinine with Entresto.  This is fine.  No need to discontinue.

## 2021-05-31 NOTE — Assessment & Plan Note (Signed)
No changes made.  EKG same as before.  No syncope.

## 2021-05-31 NOTE — Assessment & Plan Note (Signed)
Prior ejection fraction 30 to 35%.  Started on low-dose Entresto.  Creatinine 1.2.  Potassium last checked 4.4.  We will check a basic metabolic profile today.  Improvement in her echocardiogram, pulm function 60 to 65%.  Excellent.  Continue with Entresto.  Also on Toprol-XL 50 mg once a day.

## 2021-05-31 NOTE — Patient Instructions (Signed)
Medication Instructions:   Your physician recommends that you continue on your current medications as directed. Please refer to the Current Medication list given to you today.  *If you need a refill on your cardiac medications before your next appointment, please call your pharmacy*   Lab Work:  TODAY--BMET AND CBC  If you have labs (blood work) drawn today and your tests are completely normal, you will receive your results only by: [place] (if you have MyChart) OR A paper copy in the mail If you have any lab test that is abnormal or we need to change your treatment, we will call you to review the results.   Follow-Up: At [facility], you and your health needs are our priority.  As part of our continuing mission to provide you with exceptional heart care, we have created designated Provider Care Teams.  These Care Teams include your primary Cardiologist (physician) and Advanced Practice Providers (APPs -  Physician Assistants and Nurse Practitioners) who all work together to provide you with the care you need, when you need it.  We recommend signing up for the patient portal called "MyChart".  Sign up information is provided on this After Visit Summary.  MyChart is used to connect with patients for Virtual Visits (Telemedicine).  Patients are able to view lab/test results, encounter notes, upcoming appointments, etc.  Non-urgent messages can be sent to your provider as well.   To learn more about what you can do with MyChart, go to [email or web address].    Your next appointment:   6 month(s)  The format for your next appointment:   In Person  Provider:   Robbie Lis, PA-C, Melina Copa, PA-C, Cecilie Kicks, NP, Ermalinda Barrios, PA-C, Christen Bame, NP, or Richardson Dopp, PA-C      If MD is not listed, click here to update    :1}

## 2021-05-31 NOTE — Assessment & Plan Note (Signed)
Continue with simvastatin 20 mg 1/2 tablet.  No myalgias.  LDL 100.

## 2021-05-31 NOTE — Progress Notes (Signed)
Cardiology Office Note:    Date:  05/31/2021   ID:  Verl Dicker, DOB [birth year], MRN [id number]  PCP:  Celene Squibb, MD   [facility] Providers Cardiologist:  Candee Furbish, MD     Referring MD: Erven Colla, DO     History of Present Illness:    Marisa Livingston is a 85 y.o. female here for the follow-up of tachycardia, left bundle branch block, shortness of breath with chronic systolic heart failure left ventricular ejection fraction of 30 to 35% overall low risk nuclear stress test.  She wore an event monitor that showed PVCs and PACs.  Previously on Toprol and losartan.  In review of prior office note on 09/02/2020 from [facility], she reported that her shortness of breath was better  Today, she is doing well. Her shortness of breath has improved since her previous visit. She reports becoming short of breath after walking for a while. However, she reports this is normal for her.  She is compliant and tolerating her medicine. She is worried about her Entresto affecting her kidney health because she noticed some numbers that concerned her in her last lab-work results. Of note, she opts for Tylenol.    Her current PCP, Dr. Nevada Crane, is not under the [facility] system. She is considering finding a PCP within the [facility]. She has also received one COVID booster and is considering receiving an additional booster.  She denies any palpitations or chest pain. No lightheadedness, headaches, syncope, orthopnea, PND, lower extremity edema.  Past Medical History:  Diagnosis Date   Allergic rhinitis    Hyperlipidemia    Insomnia    LBBB (left bundle branch block)    Osteopenia     Past Surgical History:  Procedure Laterality Date   COLONOSCOPY N/A 09/02/2015   Procedure: COLONOSCOPY;  Surgeon: Rogene Houston, MD;  Location: [facility];  Service: Endoscopy;  Laterality: N/A;  1025 - moved to 2/9 @ 8:30 - Ann notified pt   POLYPECTOMY N/A 09/02/2015   Procedure:  POLYPECTOMY;  Surgeon: Rogene Houston, MD;  Location: [facility] ENDO SUITE;  Service: Endoscopy;  Laterality: N/A;  Hepatic Flexure Polyp    Current Medications: Current Meds  Medication Sig   Calcium Citrate (CITRACAL PO) Take by mouth.   ENTRESTO 24-26 MG TAKE ONE TABLET BY MOUTH TWO TIMES DAILY.   Loratadine (CLARITIN PO) Take by mouth.   metoprolol succinate (TOPROL-XL) 50 MG 24 hr tablet Take 1 tablet (50 mg total) by mouth daily. Take with or immediately following a meal.   Multiple Vitamins-Minerals (MULTIVITAMIN WITH MINERALS) tablet Take 1 tablet by mouth daily.   simvastatin (ZOCOR) 20 MG tablet TAKE ONE-HALF TABLET BY MOUTH DAILY     Allergies:   Codeine and Levaquin [levofloxacin in d5w]   Social History   Socioeconomic History   Marital status: Married    Spouse name: Not on file   Number of children: Not on file   Years of education: Not on file   Highest education level: Not on file  Occupational History   Not on file  Tobacco Use   Smoking status: Never   Smokeless tobacco: Never  Vaping Use   Vaping Use: Never used  Substance and Sexual Activity   Alcohol use: No   Drug use: No   Sexual activity: Not Currently    Birth control/protection: Post-menopausal  Other Topics Concern   Not on file  Social History Narrative   Lives  in alone in [place]   Retired Biochemist, clinical for [employer] Strain: Not on [employer] Insecurity: Not on file  Transportation Needs: Not on file  Physical Activity: Not on file  Stress: Not on file  Social Connections: Not on file     Family History: The patient's family history includes Cancer in her brother; Heart Problems in her mother.  ROS:   Please see the history of present illness.    (+) Dyspnea on exertion  All other systems reviewed and are negative.  EKGs/Labs/Other Studies Reviewed:    The following studies were reviewed today:  Echo  12/28/20 1. Left ventricular ejection fraction, by estimation, is 60 to 65%. The  left ventricle has normal function. The left ventricle has no regional  wall motion abnormalities. Left ventricular diastolic parameters are  consistent with Grade I diastolic  dysfunction (impaired relaxation).   2. Right ventricular systolic function is normal. The right ventricular  size is normal. There is normal pulmonary artery systolic pressure. The  estimated right ventricular systolic pressure is 82.4 mmHg.   3. The mitral valve is grossly normal. No evidence of mitral valve  regurgitation. No evidence of mitral stenosis.   4. The aortic valve is tricuspid. Aortic valve regurgitation is not  visualized. No aortic stenosis is present.   5. The inferior vena cava is normal in size with greater than 50%  respiratory variability, suggesting right atrial pressure of 3 mmHg.  Comparison(s): A prior study was performed on 07/27/20. Prior images  reviewed side by side. Improvement in LVEF, best seen in the A2c and in  comparison to contrasted images from 07/27/20.   Myoview Lexiscan Stress Test 07/27/20 EKG not interpretable due to LBBB The left ventricular ejection fraction is mildly decreased (45-54%). Nuclear stress EF: 51%. Defect 1: There is a defect present in the basal inferoseptal, mid inferoseptal, apical septal, apical inferior and apex location. Findings consistent with prior myocardial infarction. This is a low risk study. 1. Fixed perfusion defect in basal to mid inferoseptal, apical septal, and apex, with hypokinesis in these regions, consistent with prior infarct.  Given septal perfusion defects, could also be due to LBBB  2. No ischemia 3. Low risk study   ECHO 07/27/20 IMPRESSIONS   1. Global hypokinesis (worse in the septum); overall moderate to severe  LV dysfunction.   2. Left ventricular ejection fraction, by estimation, is 30 to 35%. The  left ventricle has moderately decreased function.  The left ventricle  demonstrates regional wall motion abnormalities (see scoring  diagram/findings for description). Left ventricular   diastolic parameters are consistent with Grade II diastolic dysfunction  (pseudonormalization). Elevated left atrial pressure.   3. Right ventricular systolic function is normal. The right ventricular  size is normal.   4. The mitral valve is normal in structure. Trivial mitral valve  regurgitation. No evidence of mitral stenosis.   5. The aortic valve is tricuspid. Aortic valve regurgitation is trivial.  No aortic stenosis is present.   Monitor 07/26/2020  Sinus rhythm Rare premature atrial contractions and rare premature ventricular contractions Baseline artifact frequently limits interpretation  EKG: EKG was personally reviewed 05/31/21: Sinus rhythm, rate 62 bpm LBBB 11/24/20: left bundle branch block  Recent Labs: 12/03/2020: ALT 16; BUN 19; Creatinine, Ser 1.21; Hemoglobin 13.3; Platelets 275; Potassium 4.6; Sodium 139  Recent Lipid Panel    Component Value Date/Time   CHOL 191 12/03/2020 0836  TRIG 133 12/03/2020 0836   HDL 68 12/03/2020 0836   CHOLHDL 2.8 12/03/2020 0836   CHOLHDL 3.1 07/25/2014 0810   VLDL 21 07/25/2014 0810   LDLCALC 100 (H) 12/03/2020 0836    Physical Exam:    VS:  BP 120/70 (BP Location: Left Arm, Patient Position: Sitting, Cuff Size: Normal)   Pulse 63   Ht 5' 1.5" (1.562 m)   Wt 181 lb (82.1 kg)   SpO2 93%   BMI 33.65 kg/m     Wt Readings from Last 3 Encounters:  05/31/21 181 lb (82.1 kg)  12/23/20 175 lb 6.4 oz (79.6 kg)  12/15/20 177 lb (80.3 kg)     GEN:  Well nourished, well developed in no acute distress HEENT: Normal NECK: No JVD; No carotid bruits LYMPHATICS: No lymphadenopathy CARDIAC: RRR, no murmurs, rubs, gallops RESPIRATORY:  Clear to auscultation without rales, wheezing or rhonchi  ABDOMEN: Soft, non-tender, non-distended MUSCULOSKELETAL:  No edema; No deformity  SKIN: Warm and  dry NEUROLOGIC:  Alert and oriented x 3 PSYCHIATRIC:  Normal affect   ASSESSMENT:    1. Medication management   2. Chronic systolic congestive heart failure (HCC)   3. Stage 3a chronic kidney disease ([place])   4. Left bundle branch block (LBBB) on electrocardiogram   5. Hyperlipidemia LDL goal <130     PLAN:    Chronic systolic congestive heart failure (HCC) Prior ejection fraction 30 to 35%.  Started on low-dose Entresto.  Creatinine 1.2.  Potassium last checked 4.4.  We will check a basic metabolic profile today.  Improvement in her echocardiogram, pulm function 60 to 65%.  Excellent.  Continue with Entresto.  Also on Toprol-XL 50 mg once a day.  Chronic kidney disease, stage 3 (HCC) Mildly increased creatinine.  Knows to avoid NSAIDs.  I expect a subtle increase in creatinine with Entresto.  This is fine.  No need to discontinue.  Left bundle branch block (LBBB) on electrocardiogram No changes made.  EKG same as before.  No syncope.  Hyperlipidemia LDL goal <130 Continue with simvastatin 20 mg 1/2 tablet.  No myalgias.  LDL 100.  51-month follow-up   Medication Adjustments/Labs and Tests Ordered: Current medicines are reviewed at length with the patient today.  Concerns regarding medicines are outlined above.  Orders Placed This Encounter  Procedures   Basic metabolic panel   CBC   EKG 12-Lead    No orders of the defined types were placed in this encounter.   Patient Instructions  Medication Instructions:   Your physician recommends that you continue on your current medications as directed. Please refer to the Current Medication list given to you today.  *If you need a refill on your cardiac medications before your next appointment, please call your pharmacy*   Lab Work:  TODAY--BMET AND CBC  If you have labs (blood work) drawn today and your tests are completely normal, you will receive your results only by: [place] (if you have MyChart) OR A paper  copy in the mail If you have any lab test that is abnormal or we need to change your treatment, we will call you to review the results.   Follow-Up: At [facility], you and your health needs are our priority.  As part of our continuing mission to provide you with exceptional heart care, we have created designated Provider Care Teams.  These Care Teams include your primary Cardiologist (physician) and Advanced Practice Providers (APPs -  Physician Assistants and Nurse Practitioners) who  all work together to provide you with the care you need, when you need it.  We recommend signing up for the patient portal called "MyChart".  Sign up information is provided on this After Visit Summary.  MyChart is used to connect with patients for Virtual Visits (Telemedicine).  Patients are able to view lab/test results, encounter notes, upcoming appointments, etc.  Non-urgent messages can be sent to your provider as well.   To learn more about what you can do with MyChart, go to [email or web address].    Your next appointment:   6 month(s)  The format for your next appointment:   In Person  Provider:   Robbie Lis, PA-C, Melina Copa, PA-C, Cecilie Kicks, NP, Ermalinda Barrios, PA-C, Christen Bame, NP, or Richardson Dopp, PA-C      If MD is not listed, click here to update    :1}      I,Mykaella Javier,acting as a scribe for [employer], MD.,have documented all relevant documentation on the behalf of Candee Furbish, MD,as directed by  Candee Furbish, MD while in the presence of Candee Furbish, MD.  I, Candee Furbish, MD, have reviewed all documentation for this visit. The documentation on 05/31/21 for the exam, diagnosis, procedures, and orders are all accurate and complete.   Signed, Candee Furbish, MD  05/31/2021 10:23 AM    [facility]

## 2021-06-29 ENCOUNTER — Other Ambulatory Visit: Payer: Self-pay | Admitting: Cardiology

## 2021-07-20 ENCOUNTER — Other Ambulatory Visit: Payer: Self-pay | Admitting: Cardiology

## 2021-11-07 DIAGNOSIS — I10 Essential (primary) hypertension: Secondary | ICD-10-CM | POA: Diagnosis not present

## 2021-11-15 DIAGNOSIS — I509 Heart failure, unspecified: Secondary | ICD-10-CM | POA: Diagnosis not present

## 2021-11-15 DIAGNOSIS — I10 Essential (primary) hypertension: Secondary | ICD-10-CM | POA: Diagnosis not present

## 2021-11-15 DIAGNOSIS — E782 Mixed hyperlipidemia: Secondary | ICD-10-CM | POA: Diagnosis not present

## 2021-11-15 DIAGNOSIS — N1831 Chronic kidney disease, stage 3a: Secondary | ICD-10-CM | POA: Diagnosis not present

## 2021-11-21 ENCOUNTER — Telehealth: Payer: Self-pay | Admitting: Cardiology

## 2021-11-21 NOTE — Telephone Encounter (Signed)
STAT if HR is under 50 or over 120 ?(normal HR is 60-100 beats per minute) ? ?What is your heart rate? 86 ? ?Do you have a log of your heart rate readings (document readings)?  ?11/21/21:  ?HR 118 when she got out of the shower ?HR 125 when up doing dishes ?HR 109 once she set down  ? ?HR ranging in the 90's sometimes when she first wakes up. ? ?Do you have any other symptoms? Headache, & weak in the legs a couple days ago (not having this symptom now) ?

## 2021-11-21 NOTE — Telephone Encounter (Signed)
Pt states that she does not have chest pain but does have SOB when she walks. Reports that over the last 3-4 days that her HR increases and she feels "different". Denies being dizzy. Started taking Claritin 2 days ago. Pt drinks 3-4 bottles of water daily. Explained that HR will increase with exercise. Pt states that she just started having this "different" feeling over the last 3-4 days. Please advise.  ?

## 2021-11-22 NOTE — Telephone Encounter (Signed)
Pt notified to fu with appt as scheduled 5/9.  ?

## 2021-11-27 NOTE — Progress Notes (Signed)
? ? ?Office Visit  ?  ?Patient Name: Marisa Livingston ?Date of Encounter: 11/27/2021 ? ?Primary Care Provider:  Celene Squibb, MD ?Primary Cardiologist:  Candee Furbish, MD ?Primary Electrophysiologist: None ? ?Patient Profile  ?  ?Chief Complaint: 32-monthfollow-up CHF ? ? Notable History: ?LBBB ?HFrEF with EF 30 to 35% ?Tachycardia (event monitor showed PVCs and PACs ?NICM ? Recent Studies: ?2D echo 12/2020: EF 60-65% with normal LV function, grade 1 DD normal PA systolic pressure, no LVH ?Lexiscan stress test 07/2020: No ischemia, low risk study with fixed perfusion defect in basal to mid inferior septal, apical septal and apex with hypokinesis.  Defects could be due to LBBB ?14-day ZIO monitor 07/2020: Rare premature atrial contractions and rare premature ventricular contractions ? ?History of Present Illness  ?  ?ETEKEYAH SANTIAGOis a 86y.o. female with PMH of LBBB, HFrEF with EF 30-35%, HLD.  She was referred to Dr. ARayann Hemanin 06/2020 for complaint of sinus tachycardia. 10-day ZIO monitor revealed rare PACs and PVCs with baseline sinus rhythm.  She was then referred to Dr. SMarlou Porchin 07/2020 with complaints of progressive shortness of breath.  2D echo and nuclear stress test were performed and revealed no ischemia and low risk study,2D echo with EF 60-65% grade 1 DD. She was started on Toprol XL 50 mg and losartan daily. Improvement in shortness of breath and was started on Entresto 24/26 mg. ? ?She was last seen in office on 05/2021 by Dr. SMarlou Porch During visit patient was doing well overall however still endorsing shortness of breath after walking for a while.  No medication changes were made at that time she was advised to continue on Entresto and close monitoring of kidney function. She contacted office 5/1 with complaints of SOB with ambulation and increased HR. ? ?Since last being seen in the office patient reports doing well but is experiencing increased heart rate and shortness of breath with ambulation.  She  recently attended her grandchild's graduation and had to walk long distance to [place] up multiple stairs.  She has been experiencing hip pain and will be seeing her PCP today for further evaluation.  Her blood pressure is well controlled at 120/70 and current heart rate today is 103 with no acute changes on EKG.  Patient denies chest pain, palpitations, PND, orthopnea, nausea, vomiting, dizziness, syncope, edema, weight gain, or early satiety. ? ?Past Medical History  ?  ?Past Medical History:  ?Diagnosis Date  ? Allergic rhinitis   ? Hyperlipidemia   ? Insomnia   ? LBBB (left bundle branch block)   ? Osteopenia   ? ?Past Surgical History:  ?Procedure Laterality Date  ? COLONOSCOPY N/A 09/02/2015  ? Procedure: COLONOSCOPY;  Surgeon: NRogene Houston MD;  [facility];  Service: Endoscopy;  Laterality: N/A;  1025 - moved to 2/9 @ 8:30 - Ann notified pt  ? POLYPECTOMY N/A 09/02/2015  ? Procedure: POLYPECTOMY;  Surgeon: NRogene Houston MD;  [facility];  Service: Endoscopy;  Laterality: N/A;  Hepatic Flexure Polyp  ? ? ?Allergies ? ?Allergies  ?Allergen Reactions  ? Codeine Nausea Only  ? Levaquin [Levofloxacin In D5w]   ?  Sick   ? ? ?Home Medications  ?  ?Current Outpatient Medications  ?Medication Sig Dispense Refill  ? Calcium Citrate (CITRACAL PO) Take by mouth.    ? ENTRESTO 24-26 MG TAKE ONE TABLET BY MOUTH TWO TIMES DAILY. 60 tablet 10  ? Loratadine (CLARITIN PO)  Take by mouth.    ? metoprolol succinate (TOPROL-XL) 50 MG 24 hr tablet TAKE ONE TABLET ('50MG'$  TOTAL) BY MOUTH DAILY. TAKE WITH OR IMMEDIATELY FOLLOWING A MEAL. 90 tablet 3  ? Multiple Vitamins-Minerals (MULTIVITAMIN WITH MINERALS) tablet Take 1 tablet by mouth daily.    ? simvastatin (ZOCOR) 20 MG tablet TAKE ONE-HALF TABLET BY MOUTH DAILY 45 tablet 1  ? ?No current facility-administered medications for this visit.  ?  ? ?Review of Systems  ?Please see the history of present illness.    ?(+) Right hip pain ?(+) Shortness of  breath with exertion ? ?All other systems reviewed and are otherwise negative except as noted above. ? ?Physical Exam  ?  ?Wt Readings from Last 3 Encounters:  ?05/31/21 181 lb (82.1 kg)  ?12/23/20 175 lb 6.4 oz (79.6 kg)  ?12/15/20 177 lb (80.3 kg)  ? ?WF:UXNAT were no vitals filed for this visit.,There is no height or weight on file to calculate BMI. ? ?Constitutional:   ?   Appearance: Healthy appearance. Not in distress.  ?Neck:  ?   Vascular: JVD normal.  ?Pulmonary:  ?   Effort: Pulmonary effort is normal.  ?   Breath sounds: No wheezing. No rales. Diminished in the bases ?Cardiovascular:  ?   Normal rate. Regular rhythm. Normal S1. Normal S2.   ?   Murmurs: There is no murmur.  ?Edema: ?   Peripheral edema absent.  ?Abdominal:  ?   Palpations: Abdomen is soft non tender. There is no hepatomegaly.  ?Skin: ?   General: Skin is warm and dry.  ?Neurological:  ?   General: No focal deficit present.  ?   Mental Status: Alert and oriented to person, place and time.  ?   Cranial Nerves: Cranial nerves are intact.  ?EKG/LABS/Other Studies Reviewed  ?  ?ECG personally reviewed by me today -sinus tachycardia with rate of 103 and left bundle branch block with no acute changes ? ?Lab Results  ?Component Value Date  ? WBC 6.3 05/31/2021  ? HGB 13.2 05/31/2021  ? HCT 39.1 05/31/2021  ? MCV 88 05/31/2021  ? PLT 256 05/31/2021  ? ?Lab Results  ?Component Value Date  ? CREATININE 1.09 (H) 05/31/2021  ? BUN 16 05/31/2021  ? NA 141 05/31/2021  ? K 4.9 05/31/2021  ? CL 103 05/31/2021  ? CO2 25 05/31/2021  ? ?Lab Results  ?Component Value Date  ? ALT 16 12/03/2020  ? AST 21 12/03/2020  ? ALKPHOS 55 12/03/2020  ? BILITOT 0.5 12/03/2020  ? ?Lab Results  ?Component Value Date  ? CHOL 191 12/03/2020  ? HDL 68 12/03/2020  ? LDLCALC 100 (H) 12/03/2020  ? TRIG 133 12/03/2020  ? CHOLHDL 2.8 12/03/2020  ?  ?No results found for: HGBA1C ? ?Assessment & Plan  ?  ?1.  HFrEF: ?-12/2020: EF 60-65% with normal LV function, grade 1 DD normal PA  systolic pressure, no LVH ?-Patient does endorse some shortness of breath with ambulation ?-Continue GDMT with Entresto 24-26 mg twice daily, Toprol XL 50 mg daily ?-Low sodium diet, fluid restriction <2L, and daily weights encouraged. Educated to contact our office for weight gain of 2 lbs overnight or 5 lbs in one week.  ?-We will check the BMET and BNP today ? ?2.  LBBB: ?-No reports of syncope ?-EKG with out significant changes ? ?3.  Shortness of breath: ?-Patient denies chest pain or dizziness with shortness of breath ?-She states that she has noticed an increase  since hurting her right hip ?-We will have her record her heart rate for 1 week and call with results ?-If patient's heart rate remains elevated following 1 week we may increase Toprol-XL to 75 mg daily ? ?4.  Hyperlipidemia: ?-Continue Zocor 20 mg daily ?-Last lipids completed  ? ?Disposition: Follow-up with Candee Furbish, MD or [facility] in 6 months ?   ?Medication Adjustments/Labs and Tests Ordered: ?Current medicines are reviewed at length with the patient today.  Concerns regarding medicines are outlined above.  ? ?Signed, ?Mable Fill, Marissa Nestle, NP ?11/27/2021, 5:16 PM ?[place] ?

## 2021-11-29 ENCOUNTER — Encounter: Payer: Self-pay | Admitting: Nurse Practitioner

## 2021-11-29 ENCOUNTER — Ambulatory Visit: Payer: PPO | Admitting: Nurse Practitioner

## 2021-11-29 VITALS — BP 120/70 | HR 103 | Ht 62.0 in | Wt 178.8 lb

## 2021-11-29 DIAGNOSIS — M7061 Trochanteric bursitis, right hip: Secondary | ICD-10-CM | POA: Diagnosis not present

## 2021-11-29 DIAGNOSIS — R0602 Shortness of breath: Secondary | ICD-10-CM

## 2021-11-29 DIAGNOSIS — I5022 Chronic systolic (congestive) heart failure: Secondary | ICD-10-CM

## 2021-11-29 DIAGNOSIS — E785 Hyperlipidemia, unspecified: Secondary | ICD-10-CM

## 2021-11-29 DIAGNOSIS — I447 Left bundle-branch block, unspecified: Secondary | ICD-10-CM

## 2021-11-29 NOTE — Patient Instructions (Addendum)
Medication Instructions:  ?Your physician recommends that you continue on your current medications as directed. Please refer to the Current Medication list given to you today. ? ?*If you need a refill on your cardiac medications before your next appointment, please call your pharmacy* ? ? ?Lab Work: ?TODAY: BMET & PRO BNP ? ?If you have labs (blood work) drawn today and your tests are completely normal, you will receive your results only by: ?MyChart Message (if you have MyChart) OR ?A paper copy in the mail ?If you have any lab test that is abnormal or we need to change your treatment, we will call you to review the results. ? ? ?Testing/Procedures: ?None ordered ? ? ? ?Follow-Up: ?At [facility], you and your health needs are our priority.  As part of our continuing mission to provide you with exceptional heart care, we have created designated Provider Care Teams.  These Care Teams include your primary Cardiologist (physician) and Advanced Practice Providers (APPs -  Physician Assistants and Nurse Practitioners) who all work together to provide you with the care you need, when you need it. ? ?We recommend signing up for the patient portal called "MyChart".  Sign up information is provided on this After Visit Summary.  MyChart is used to connect with patients for Virtual Visits (Telemedicine).  Patients are able to view lab/test results, encounter notes, upcoming appointments, etc.  Non-urgent messages can be sent to your provider as well.   ?To learn more about what you can do with MyChart, go to [email or web address].   ? ?Your next appointment:   ?6 month(s)  06/05/22 ARRIVE AT 9:25 ? ?The format for your next appointment:   ?In Person ? ?Provider:   ?Candee Furbish, MD   ? ? ?Other Instructions ?Monitor your heart rate for 1 week at home, call or send a myhcart with those readings in 1 week  ? ?Important Information About Sugar ? ? ? ? ?  ?

## 2021-11-30 LAB — BASIC METABOLIC PANEL
BUN/Creatinine Ratio: 20 (ref 12–28)
BUN: 17 mg/dL (ref 8–27)
CO2: 23 mmol/L (ref 20–29)
Calcium: 9.4 mg/dL (ref 8.7–10.3)
Chloride: 99 mmol/L (ref 96–106)
Creatinine, Ser: 0.86 mg/dL (ref 0.57–1.00)
Glucose: 102 mg/dL — ABNORMAL HIGH (ref 70–99)
Potassium: 4.6 mmol/L (ref 3.5–5.2)
Sodium: 138 mmol/L (ref 134–144)
eGFR: 67 mL/min/{1.73_m2} (ref >59)

## 2021-11-30 LAB — PRO B NATRIURETIC PEPTIDE: NT-Pro BNP: 127 pg/mL (ref 0–738)

## 2021-12-07 ENCOUNTER — Encounter: Payer: Self-pay | Admitting: Cardiology

## 2022-01-16 DIAGNOSIS — L821 Other seborrheic keratosis: Secondary | ICD-10-CM | POA: Diagnosis not present

## 2022-01-16 DIAGNOSIS — L570 Actinic keratosis: Secondary | ICD-10-CM | POA: Diagnosis not present

## 2022-01-16 DIAGNOSIS — D485 Neoplasm of uncertain behavior of skin: Secondary | ICD-10-CM | POA: Diagnosis not present

## 2022-01-16 DIAGNOSIS — L820 Inflamed seborrheic keratosis: Secondary | ICD-10-CM | POA: Diagnosis not present

## 2022-01-17 DIAGNOSIS — H5213 Myopia, bilateral: Secondary | ICD-10-CM | POA: Diagnosis not present

## 2022-01-17 DIAGNOSIS — Z961 Presence of intraocular lens: Secondary | ICD-10-CM | POA: Diagnosis not present

## 2022-04-12 DIAGNOSIS — L570 Actinic keratosis: Secondary | ICD-10-CM | POA: Diagnosis not present

## 2022-04-12 DIAGNOSIS — D225 Melanocytic nevi of trunk: Secondary | ICD-10-CM | POA: Diagnosis not present

## 2022-04-12 DIAGNOSIS — L821 Other seborrheic keratosis: Secondary | ICD-10-CM | POA: Diagnosis not present

## 2022-04-12 DIAGNOSIS — L814 Other melanin hyperpigmentation: Secondary | ICD-10-CM | POA: Diagnosis not present

## 2022-04-14 ENCOUNTER — Other Ambulatory Visit (HOSPITAL_COMMUNITY): Payer: Self-pay | Admitting: Internal Medicine

## 2022-04-14 DIAGNOSIS — Z1231 Encounter for screening mammogram for malignant neoplasm of breast: Secondary | ICD-10-CM

## 2022-04-17 DIAGNOSIS — Z23 Encounter for immunization: Secondary | ICD-10-CM | POA: Diagnosis not present

## 2022-04-20 ENCOUNTER — Ambulatory Visit (HOSPITAL_COMMUNITY)
Admission: RE | Admit: 2022-04-20 | Discharge: 2022-04-20 | Disposition: A | Discharge status: Home / Self Care | Payer: PPO | Source: Ambulatory Visit | Attending: Internal Medicine | Admitting: Internal Medicine

## 2022-04-20 DIAGNOSIS — L570 Actinic keratosis: Secondary | ICD-10-CM | POA: Diagnosis not present

## 2022-04-20 DIAGNOSIS — Z1231 Encounter for screening mammogram for malignant neoplasm of breast: Secondary | ICD-10-CM | POA: Insufficient documentation

## 2022-04-20 DIAGNOSIS — D692 Other nonthrombocytopenic purpura: Secondary | ICD-10-CM | POA: Diagnosis not present

## 2022-05-09 DIAGNOSIS — I10 Essential (primary) hypertension: Secondary | ICD-10-CM | POA: Diagnosis not present

## 2022-05-09 DIAGNOSIS — E782 Mixed hyperlipidemia: Secondary | ICD-10-CM | POA: Diagnosis not present

## 2022-05-16 DIAGNOSIS — N1831 Chronic kidney disease, stage 3a: Secondary | ICD-10-CM | POA: Diagnosis not present

## 2022-05-16 DIAGNOSIS — E782 Mixed hyperlipidemia: Secondary | ICD-10-CM | POA: Diagnosis not present

## 2022-05-16 DIAGNOSIS — H9313 Tinnitus, bilateral: Secondary | ICD-10-CM | POA: Diagnosis not present

## 2022-05-16 DIAGNOSIS — I509 Heart failure, unspecified: Secondary | ICD-10-CM | POA: Diagnosis not present

## 2022-05-16 DIAGNOSIS — I10 Essential (primary) hypertension: Secondary | ICD-10-CM | POA: Diagnosis not present

## 2022-05-16 DIAGNOSIS — Z0000 Encounter for general adult medical examination without abnormal findings: Secondary | ICD-10-CM | POA: Diagnosis not present

## 2022-05-16 DIAGNOSIS — Z23 Encounter for immunization: Secondary | ICD-10-CM | POA: Diagnosis not present

## 2022-05-24 ENCOUNTER — Other Ambulatory Visit: Payer: Self-pay | Admitting: Cardiology

## 2022-06-05 ENCOUNTER — Encounter: Payer: Self-pay | Admitting: Cardiology

## 2022-06-05 ENCOUNTER — Ambulatory Visit: Payer: PPO | Attending: Cardiology | Admitting: Cardiology

## 2022-06-05 VITALS — BP 124/74 | HR 86 | Ht 62.0 in | Wt 178.0 lb

## 2022-06-05 DIAGNOSIS — I447 Left bundle-branch block, unspecified: Secondary | ICD-10-CM

## 2022-06-05 DIAGNOSIS — I5022 Chronic systolic (congestive) heart failure: Secondary | ICD-10-CM | POA: Diagnosis not present

## 2022-06-05 NOTE — Progress Notes (Signed)
Cardiology Office Note:    Date:  06/05/2022   ID:  Marisa Livingston, DOB [birth year], MRN [id number]  PCP:  Celene Squibb, MD   [facility] Providers Cardiologist:  Candee Furbish, MD     Referring MD: Celene Squibb, MD     History of Present Illness:    Marisa Livingston is a 86 y.o. female here for the follow-up of tachycardia, left bundle branch block, shortness of breath with chronic systolic heart failure left ventricular ejection fraction of 30 to 35% overall low risk nuclear stress test.  She wore an event monitor that showed PVCs and PACs.  Previously on Toprol and losartan.  She saw Ambrose Pancoast, NP, 11/29/2021. She reported increased heart rate and shortness of breath with ambulation since her last visit. She had also been experiencing hip pain. Her blood pressure was well-controlled at 120/70 and her HR was 103 with no acute changes on EKG.  In review of prior office note on 09/02/2020 from Tommye Standard, she reported that her shortness of breath was better  At her last visit, she was doing well. Her shortness of breath had improved since her previous visit. She reported becoming short of breath after walking for a while. However, she reported this was normal for her.  She was compliant and tolerating her medicine. She was worried about her Entresto affecting her kidney health because she had noticed some numbers that concerned her in her last lab-work results. Of note, she opts for Tylenol.    Her current PCP, Dr. Nevada Crane, is not under the [facility] system. She was considering finding a PCP within the [facility]. She had also received one COVID booster and was considering receiving an additional booster.  Today, she says she is feeling pretty good.  She states that she experiences some shortness of breath at times.   Additionally, she mentions that sometimes she feels a sensation like "something is coming up." At these times, she will sit still and relax and it will subside.    When she will check her heart rate resting, it will at times be 49 bpm.  She is working on losing weight. Her weight today was 178 lbs, the same as it was at her last appointment in May of 2023.    For exercise, she tries to keep walking fairly routinely.   She denies any palpitations, chest pain, or peripheral edema. No lightheadedness, headaches, syncope, orthopnea, or PND.  Past Medical History:  Diagnosis Date   Allergic rhinitis    Hyperlipidemia    Insomnia    LBBB (left bundle branch block)    Osteopenia     Past Surgical History:  Procedure Laterality Date   COLONOSCOPY N/A 09/02/2015   Procedure: COLONOSCOPY;  Surgeon: Rogene Houston, MD;  [facility];  Service: Endoscopy;  Laterality: N/A;  1025 - moved to 2/9 @ 8:30 - Ann notified pt   POLYPECTOMY N/A 09/02/2015   Procedure: POLYPECTOMY;  Surgeon: Rogene Houston, MD;  [facility];  Service: Endoscopy;  Laterality: N/A;  Hepatic Flexure Polyp    Current Medications: Current Meds  Medication Sig   Calcium Citrate (CITRACAL PO) Take by mouth.   Loratadine (CLARITIN PO) Take by mouth.   metoprolol succinate (TOPROL-XL) 50 MG 24 hr tablet TAKE ONE TABLET ('50MG'$  TOTAL) BY MOUTH DAILY. TAKE WITH OR IMMEDIATELY FOLLOWING A MEAL.   Multiple Vitamins-Minerals (MULTIVITAMIN WITH MINERALS) tablet Take 1 tablet by mouth daily.  sacubitril-valsartan (ENTRESTO) 24-26 MG TAKE ONE TABLET BY MOUTH TWO TIMES DAILY.   simvastatin (ZOCOR) 10 MG tablet Take 10 mg by mouth daily.     Allergies:   Codeine and Levaquin [levofloxacin in d5w]   Social History   Socioeconomic History   Marital status: Married    Spouse name: Not on file   Number of children: Not on file   Years of education: Not on file   Highest education level: Not on file  Occupational History   Not on file  Tobacco Use   Smoking status: Never   Smokeless tobacco: Never  Vaping Use   Vaping Use: Never used  Substance and Sexual  Activity   Alcohol use: No   Drug use: No   Sexual activity: Not Currently    Birth control/protection: Post-menopausal  Other Topics Concern   Not on file  Social History Narrative   Lives in alone in [place]   Retired Biochemist, clinical for [employer] Strain: Not on file  Food Insecurity: Not on file  Transportation Needs: Not on file  Physical Activity: Not on file  Stress: Not on file  Social Connections: Not on file     Family History: The patient's family history includes Cancer in her brother; Heart Problems in her mother.  ROS:   Please see the history of present illness.    (+) Intermittent shortness of breath  All other systems reviewed and are negative.  EKGs/Labs/Other Studies Reviewed:    The following studies were reviewed today:  Echo 12/28/20 1. Left ventricular ejection fraction, by estimation, is 60 to 65%. The  left ventricle has normal function. The left ventricle has no regional  wall motion abnormalities. Left ventricular diastolic parameters are  consistent with Grade I diastolic  dysfunction (impaired relaxation).   2. Right ventricular systolic function is normal. The right ventricular  size is normal. There is normal pulmonary artery systolic pressure. The  estimated right ventricular systolic pressure is 76.7 mmHg.   3. The mitral valve is grossly normal. No evidence of mitral valve  regurgitation. No evidence of mitral stenosis.   4. The aortic valve is tricuspid. Aortic valve regurgitation is not  visualized. No aortic stenosis is present.   5. The inferior vena cava is normal in size with greater than 50%  respiratory variability, suggesting right atrial pressure of 3 mmHg.  Comparison(s): A prior study was performed on 07/27/20. Prior images  reviewed side by side. Improvement in LVEF, best seen in the A2c and in  comparison to contrasted images from 07/27/20.   Myoview  Lexiscan Stress Test 07/27/20 EKG not interpretable due to LBBB The left ventricular ejection fraction is mildly decreased (45-54%). Nuclear stress EF: 51%. Defect 1: There is a defect present in the basal inferoseptal, mid inferoseptal, apical septal, apical inferior and apex location. Findings consistent with prior myocardial infarction. This is a low risk study. 1. Fixed perfusion defect in basal to mid inferoseptal, apical septal, and apex, with hypokinesis in these regions, consistent with prior infarct.  Given septal perfusion defects, could also be due to LBBB  2. No ischemia 3. Low risk study   ECHO 07/27/20 IMPRESSIONS   1. Global hypokinesis (worse in the septum); overall moderate to severe  LV dysfunction.   2. Left ventricular ejection fraction, by estimation, is 30 to 35%. The  left ventricle has moderately decreased function. The left ventricle  demonstrates regional wall motion  abnormalities (see scoring  diagram/findings for description). Left ventricular   diastolic parameters are consistent with Grade II diastolic dysfunction  (pseudonormalization). Elevated left atrial pressure.   3. Right ventricular systolic function is normal. The right ventricular  size is normal.   4. The mitral valve is normal in structure. Trivial mitral valve  regurgitation. No evidence of mitral stenosis.   5. The aortic valve is tricuspid. Aortic valve regurgitation is trivial.  No aortic stenosis is present.   Monitor 07/26/2020  Sinus rhythm Rare premature atrial contractions and rare premature ventricular contractions Baseline artifact frequently limits interpretation  EKG: EKG was personally reviewed 06/05/22: EKG was not ordered. 05/31/21: Sinus rhythm, rate 62 bpm LBBB 11/24/20: left bundle branch block  Recent Labs: 11/29/2021: BUN 17; Creatinine, Ser 0.86; NT-Pro BNP 127; Potassium 4.6; Sodium 138  Recent Lipid Panel    Component Value Date/Time   CHOL 191 12/03/2020 0836   TRIG  133 12/03/2020 0836   HDL 68 12/03/2020 0836   CHOLHDL 2.8 12/03/2020 0836   CHOLHDL 3.1 07/25/2014 0810   VLDL 21 07/25/2014 0810   LDLCALC 100 (H) 12/03/2020 0836    Physical Exam:    VS:  BP 124/74   Pulse 86   Ht '5\' 2"'$  (1.575 m)   Wt 178 lb (80.7 kg)   SpO2 95%   BMI 32.56 kg/m     Wt Readings from Last 3 Encounters:  06/05/22 178 lb (80.7 kg)  11/29/21 178 lb 12.8 oz (81.1 kg)  05/31/21 181 lb (82.1 kg)     GEN:  Well nourished, well developed in no acute distress HEENT: Normal NECK: No JVD; No carotid bruits LYMPHATICS: No lymphadenopathy CARDIAC: RRR, no murmurs, rubs, gallops RESPIRATORY:  Clear to auscultation without rales, wheezing or rhonchi  ABDOMEN: Soft, non-tender, non-distended MUSCULOSKELETAL:  No edema; No deformity  SKIN: Warm and dry NEUROLOGIC:  Alert and oriented x 3 PSYCHIATRIC:  Normal affect   ASSESSMENT:    1. Chronic systolic congestive heart failure ([place])   2. LBBB (left bundle branch block)     PLAN:    Chronic systolic congestive heart failure (HCC) Prior ejection fraction 30 to 35%.  Started on low-dose Entresto.  Creatinine 1.2.  Potassium last checked 4.4.  We will check a basic metabolic profile today.  Improvement in her echocardiogram, pulm function 60 to 65%.  Excellent.  Continue with Entresto.  Also on Toprol-XL 50 mg once a day.  She is doing quite well.  She has an Visual merchandiser.  No changes.   Chronic kidney disease, stage 3 (HCC) Mildly increased creatinine.  Knows to avoid NSAIDs.  I expect a subtle increase in creatinine with Entresto.  This is fine.  No need to discontinue.  Actually her last creatinine was 1.06.  Excellent.  Hemoglobin 12.7   Left bundle branch block (LBBB) on electrocardiogram No changes made.  EKG same as before.  No syncope.   Hyperlipidemia LDL goal <130 Continue with simvastatin 20 mg 1/2 tablet.  No myalgias.  LDL 98.  No changes made.   Follow up: 6 months.   Medication Adjustments/Labs  and Tests Ordered: Current medicines are reviewed at length with the patient today.  Concerns regarding medicines are outlined above.  No orders of the defined types were placed in this encounter.   No orders of the defined types were placed in this encounter.   Patient Instructions  Medication Instructions:  The current medical regimen is effective;  continue present plan and  medications.  *If you need a refill on your cardiac medications before your next appointment, please call your pharmacy*  Follow-Up: At [facility], you and your health needs are our priority.  As part of our continuing mission to provide you with exceptional heart care, we have created designated Provider Care Teams.  These Care Teams include your primary Cardiologist (physician) and Advanced Practice Providers (APPs -  Physician Assistants and Nurse Practitioners) who all work together to provide you with the care you need, when you need it.  We recommend signing up for the patient portal called "MyChart".  Sign up information is provided on this After Visit Summary.  MyChart is used to connect with patients for Virtual Visits (Telemedicine).  Patients are able to view lab/test results, encounter notes, upcoming appointments, etc.  Non-urgent messages can be sent to your provider as well.   To learn more about what you can do with MyChart, go to [email or web address].    Your next appointment:   6 month(s)  The format for your next appointment:   In Person  Provider:   Candee Furbish, MD      Important Information About Sugar          I,Breanna Adamick,acting as a scribe for Candee Furbish, MD.,have documented all relevant documentation on the behalf of Candee Furbish, MD,as directed by  Candee Furbish, MD while in the presence of Candee Furbish, MD.   I, Candee Furbish, MD, have reviewed all documentation for this visit. The documentation on 06/05/22 for the exam, diagnosis, procedures, and orders are all  accurate and complete.   Signed, Candee Furbish, MD  06/05/2022 10:04 AM    [place]

## 2022-06-05 NOTE — Patient Instructions (Signed)
Medication Instructions:  The current medical regimen is effective;  continue present plan and medications.  *If you need a refill on your cardiac medications before your next appointment, please call your pharmacy*  Follow-Up: At [facility], you and your health needs are our priority.  As part of our continuing mission to provide you with exceptional heart care, we have created designated Provider Care Teams.  These Care Teams include your primary Cardiologist (physician) and Advanced Practice Providers (APPs -  Physician Assistants and Nurse Practitioners) who all work together to provide you with the care you need, when you need it.  We recommend signing up for the patient portal called "MyChart".  Sign up information is provided on this After Visit Summary.  MyChart is used to connect with patients for Virtual Visits (Telemedicine).  Patients are able to view lab/test results, encounter notes, upcoming appointments, etc.  Non-urgent messages can be sent to your provider as well.   To learn more about what you can do with MyChart, go to [email or web address].    Your next appointment:   6 month(s)  The format for your next appointment:   In Person  Provider:   Mark Skains, MD      Important Information About Sugar       

## 2022-07-14 ENCOUNTER — Other Ambulatory Visit: Payer: Self-pay | Admitting: Cardiology

## 2022-07-21 ENCOUNTER — Emergency Department (HOSPITAL_COMMUNITY): Payer: PPO

## 2022-07-21 ENCOUNTER — Encounter (HOSPITAL_COMMUNITY): Payer: Self-pay

## 2022-07-21 ENCOUNTER — Encounter (HOSPITAL_COMMUNITY): Payer: Self-pay | Admitting: Emergency Medicine

## 2022-07-21 ENCOUNTER — Inpatient Hospital Stay (HOSPITAL_COMMUNITY)
Admission: EM | Admit: 2022-07-21 | Discharge: 2022-07-26 | DRG: 242 | Disposition: A | Discharge status: Home / Self Care | Payer: PPO | Attending: Internal Medicine | Admitting: Internal Medicine

## 2022-07-21 ENCOUNTER — Other Ambulatory Visit: Payer: Self-pay

## 2022-07-21 DIAGNOSIS — E669 Obesity, unspecified: Secondary | ICD-10-CM | POA: Diagnosis present

## 2022-07-21 DIAGNOSIS — I959 Hypotension, unspecified: Secondary | ICD-10-CM | POA: Diagnosis not present

## 2022-07-21 DIAGNOSIS — Z79899 Other long term (current) drug therapy: Secondary | ICD-10-CM | POA: Diagnosis not present

## 2022-07-21 DIAGNOSIS — I5033 Acute on chronic diastolic (congestive) heart failure: Secondary | ICD-10-CM | POA: Diagnosis not present

## 2022-07-21 DIAGNOSIS — I442 Atrioventricular block, complete: Secondary | ICD-10-CM | POA: Diagnosis not present

## 2022-07-21 DIAGNOSIS — E785 Hyperlipidemia, unspecified: Secondary | ICD-10-CM | POA: Diagnosis not present

## 2022-07-21 DIAGNOSIS — R001 Bradycardia, unspecified: Secondary | ICD-10-CM | POA: Diagnosis not present

## 2022-07-21 DIAGNOSIS — R55 Syncope and collapse: Principal | ICD-10-CM

## 2022-07-21 DIAGNOSIS — I110 Hypertensive heart disease with heart failure: Secondary | ICD-10-CM | POA: Diagnosis not present

## 2022-07-21 DIAGNOSIS — Z6832 Body mass index (BMI) 32.0-32.9, adult: Secondary | ICD-10-CM | POA: Diagnosis not present

## 2022-07-21 DIAGNOSIS — R531 Weakness: Secondary | ICD-10-CM | POA: Diagnosis not present

## 2022-07-21 DIAGNOSIS — Z885 Allergy status to narcotic agent status: Secondary | ICD-10-CM | POA: Diagnosis not present

## 2022-07-21 DIAGNOSIS — I429 Cardiomyopathy, unspecified: Secondary | ICD-10-CM | POA: Diagnosis not present

## 2022-07-21 DIAGNOSIS — Z8249 Family history of ischemic heart disease and other diseases of the circulatory system: Secondary | ICD-10-CM

## 2022-07-21 DIAGNOSIS — Z950 Presence of cardiac pacemaker: Secondary | ICD-10-CM | POA: Diagnosis not present

## 2022-07-21 DIAGNOSIS — Z881 Allergy status to other antibiotic agents status: Secondary | ICD-10-CM

## 2022-07-21 DIAGNOSIS — R918 Other nonspecific abnormal finding of lung field: Secondary | ICD-10-CM | POA: Diagnosis not present

## 2022-07-21 DIAGNOSIS — Z809 Family history of malignant neoplasm, unspecified: Secondary | ICD-10-CM

## 2022-07-21 DIAGNOSIS — I10 Essential (primary) hypertension: Secondary | ICD-10-CM | POA: Diagnosis not present

## 2022-07-21 LAB — CBC
HCT: 38.8 % (ref 36.0–46.0)
Hemoglobin: 12.4 g/dL (ref 12.0–15.0)
MCH: 28.9 pg (ref 26.0–34.0)
MCHC: 32 g/dL (ref 30.0–36.0)
MCV: 90.4 fL (ref 80.0–100.0)
Platelets: 256 10*3/uL (ref 150–400)
RBC: 4.29 MIL/uL (ref 3.87–5.11)
RDW: 13.9 % (ref 11.5–15.5)
WBC: 8.8 10*3/uL (ref 4.0–10.5)
nRBC: 0 % (ref 0.0–0.2)

## 2022-07-21 LAB — TROPONIN I (HIGH SENSITIVITY)
Troponin I (High Sensitivity): 7 ng/L (ref <18)
Troponin I (High Sensitivity): 15 ng/L (ref <18)

## 2022-07-21 LAB — HEPATIC FUNCTION PANEL
ALT: 18 U/L (ref 0–44)
AST: 24 U/L (ref 15–41)
Albumin: 3.8 g/dL (ref 3.5–5.0)
Alkaline Phosphatase: 54 U/L (ref 38–126)
Bilirubin, Direct: 0.1 mg/dL (ref 0.0–0.2)
Indirect Bilirubin: 0.2 mg/dL — ABNORMAL LOW (ref 0.3–0.9)
Total Bilirubin: 0.3 mg/dL (ref 0.3–1.2)
Total Protein: 7.4 g/dL (ref 6.5–8.1)

## 2022-07-21 LAB — BASIC METABOLIC PANEL
Anion gap: 10 (ref 5–15)
BUN: 23 mg/dL (ref 8–23)
CO2: 25 mmol/L (ref 22–32)
Calcium: 9.5 mg/dL (ref 8.9–10.3)
Chloride: 104 mmol/L (ref 98–111)
Creatinine, Ser: 1.03 mg/dL — ABNORMAL HIGH (ref 0.44–1.00)
GFR, Estimated: 54 mL/min — ABNORMAL LOW (ref >60)
Glucose, Bld: 158 mg/dL — ABNORMAL HIGH (ref 70–99)
Potassium: 3.8 mmol/L (ref 3.5–5.1)
Sodium: 139 mmol/L (ref 135–145)

## 2022-07-21 LAB — CBG MONITORING, ED: Glucose-Capillary: 139 mg/dL — ABNORMAL HIGH (ref 70–99)

## 2022-07-21 MED ORDER — ACETAMINOPHEN 325 MG PO TABS
650.0000 mg | ORAL_TABLET | ORAL | Status: DC | PRN
  Administered 2022-07-25: 650 mg via ORAL
  Filled 2022-07-21: qty 2

## 2022-07-21 MED ORDER — SODIUM CHLORIDE 0.9 % IV BOLUS
500.0000 mL | Freq: Once | INTRAVENOUS | Status: AC
  Administered 2022-07-21: 500 mL via INTRAVENOUS

## 2022-07-21 MED ORDER — HEPARIN SODIUM (PORCINE) 5000 UNIT/ML IJ SOLN
5000.0000 [IU] | Freq: Three times a day (TID) | INTRAMUSCULAR | Status: DC
  Administered 2022-07-22 – 2022-07-24 (×8): 5000 [IU] via SUBCUTANEOUS
  Filled 2022-07-21 (×7): qty 1

## 2022-07-21 MED ORDER — SODIUM CHLORIDE 0.9% FLUSH
3.0000 mL | Freq: Two times a day (BID) | INTRAVENOUS | Status: DC
  Administered 2022-07-22 – 2022-07-26 (×10): 3 mL via INTRAVENOUS

## 2022-07-21 MED ORDER — SODIUM CHLORIDE 0.9% FLUSH: 3.0000 mL | INTRAVENOUS | Status: DC | PRN

## 2022-07-21 MED ORDER — SIMVASTATIN 20 MG PO TABS
10.0000 mg | ORAL_TABLET | Freq: Every day | ORAL | Status: DC
  Administered 2022-07-22 – 2022-07-26 (×5): 10 mg via ORAL
  Filled 2022-07-21 (×5): qty 1

## 2022-07-21 MED ORDER — SACUBITRIL-VALSARTAN 24-26 MG PO TABS: 1.0000 | ORAL_TABLET | Freq: Two times a day (BID) | ORAL | Status: DC

## 2022-07-21 MED ORDER — SODIUM CHLORIDE 0.9 % IV SOLN: 250.0000 mL | INTRAVENOUS | Status: DC | PRN

## 2022-07-21 MED ORDER — ATROPINE SULFATE 1 MG/ML IV SOLN
1.0000 mg | Freq: Once | INTRAVENOUS | Status: AC
  Administered 2022-07-21: 1 mg via INTRAVENOUS
  Filled 2022-07-21: qty 1

## 2022-07-21 MED ORDER — ONDANSETRON HCL 4 MG/2ML IJ SOLN: 4.0000 mg | Freq: Four times a day (QID) | INTRAMUSCULAR | Status: DC | PRN

## 2022-07-21 NOTE — ED Triage Notes (Signed)
Pt arrived from [facility] as transfer for cardiology consult post syncopal episode with symptomatic bradycardia. Pt had syncopal episode at home, EMS arrived to find patient bradycardic. Atropine administered at [facility]. HR maintaining in 30-35 bpm. Pt is alert and oriented, other vital signs within normal limits.   PTA Vitals HR 34 BP 140/84 SPO2 98% RA

## 2022-07-21 NOTE — ED Notes (Signed)
Patient has bradycardia HR 37, atropine '1mg'$  ordered given HR increased to 41. Physician aware and at bedside, patient awake alert and having no symptoms at this time. Fluid bolus 530m infusing.

## 2022-07-21 NOTE — ED Triage Notes (Signed)
Pt brought in by EMS after she had a syncopal episode at home. States she was in [place] when she started to "feel woozy" and that the next thing she remembers is waking up in floor. EMS states that upon their arrival, pt's HR was 40 but soon came up to 60's without any intervention from them.

## 2022-07-21 NOTE — ED Provider Notes (Incomplete)
[facility] EMERGENCY DEPARTMENT Provider Note   CSN: [id number] Arrival date & time: 07/21/22  1931     History {Add pertinent medical, surgical, social history, OB history to HPI:1} Chief Complaint  Patient presents with   Loss of Consciousness    Marisa Livingston is a 86 y.o. female.  Patient has syncopal episode.  She has a history of congestive heart failure and elevated lipids.   Loss of Consciousness      Home Medications Prior to Admission medications   Medication Sig Start Date End Date Taking? Authorizing Provider  Calcium Citrate (CITRACAL PO) Take by mouth.    [provider]  Loratadine (CLARITIN PO) Take by mouth.    [provider]  metoprolol succinate (TOPROL-XL) 50 MG 24 hr tablet Take 1 tablet (50 mg total) by mouth daily. 07/14/22   Jerline Pain, MD  Multiple Vitamins-Minerals (MULTIVITAMIN WITH MINERALS) tablet Take 1 tablet by mouth daily.    [provider]  sacubitril-valsartan (ENTRESTO) 24-26 MG TAKE ONE TABLET BY MOUTH TWO TIMES DAILY. 05/24/22   Jerline Pain, MD  simvastatin (ZOCOR) 10 MG tablet Take 10 mg by mouth daily.    [provider]      Allergies    Codeine and Levaquin [levofloxacin in d5w]    Review of Systems   Review of Systems  Cardiovascular:  Positive for syncope.    Physical Exam Updated Vital Signs BP (!) 119/44   Pulse (!) 33   Temp 97.9 F (36.6 C) (Oral)   Resp 15   Ht '5\' 2"'$  (1.575 m)   Wt 82.7 kg   SpO2 94%   BMI 33.36 kg/m  Physical Exam  ED Results / Procedures / Treatments   Labs (all labs ordered are listed, but only abnormal results are displayed) Labs Reviewed  BASIC METABOLIC PANEL - Abnormal; Notable for the following components:      Result Value   Glucose, Bld 158 (*)    Creatinine, Ser 1.03 (*)    GFR, Estimated 54 (*)    All other components within normal limits  HEPATIC FUNCTION PANEL - Abnormal; Notable for the following components:   Indirect  Bilirubin 0.2 (*)    All other components within normal limits  CBG MONITORING, ED - Abnormal; Notable for the following components:   Glucose-Capillary 139 (*)    All other components within normal limits  CBC  URINALYSIS, ROUTINE W REFLEX MICROSCOPIC  TROPONIN I (HIGH SENSITIVITY)  TROPONIN I (HIGH SENSITIVITY)    EKG None  Radiology DG Chest Port 1 View  Result Date: 07/21/2022 CLINICAL DATA:  Weak EXAM: PORTABLE CHEST 1 VIEW.  Patient is rotated. COMPARISON:  None Available. FINDINGS: Prominent cardiac silhouette due to [facility] portable technique. The heart and mediastinal contours are within normal limits. No focal consolidation. Coarsened and prominent markings with no overt pulmonary edema. No pleural effusion. No pneumothorax. No acute osseous abnormality. IMPRESSION: Findings suggestive of viral bronchiolitis versus reactive airway disease. Electronically Signed   By: Iven Finn M.D.   On: 07/21/2022 20:22    Procedures Procedures  {Document cardiac monitor, telemetry assessment procedure when appropriate:1}  Medications Ordered in ED Medications  sodium chloride 0.9 % bolus 500 mL (0 mLs Intravenous Stopped 07/21/22 2151)  atropine injection 1 mg (1 mg Intravenous Given 07/21/22 2018)    ED Course/ Medical Decision Making/ A&P  CRITICAL CARE Performed by: Milton Ferguson Total critical care time: 45 minutes Critical care time was exclusive of  separately billable procedures and treating other patients. Critical care was necessary to treat or prevent imminent or life-threatening deterioration. Critical care was time spent personally by me on the following activities: development of treatment plan with patient and/or surrogate as well as nursing, discussions with consultants, evaluation of patient's response to treatment, examination of patient, obtaining history from patient or surrogate, ordering and performing treatments and interventions, ordering and review of laboratory  studies, ordering and review of radiographic studies, pulse oximetry and re-evaluation of patient's condition.    Patient with third-degree heart block.  I spoke with Dr. Marcelle Smiling cardiology and he feels like the patient needs to be immediately transferred to [facility] for possible pacemaker.  I spoke with Dr. Tamera Punt and she is the accepting physician in the ED                         Medical Decision Making Amount and/or Complexity of Data Reviewed Labs: ordered. Radiology: ordered.  Risk Prescription drug management.  Patient with third-degree heart block.  She is transferred to [facility], ED department  {Document critical care time when appropriate:1} {Document review of labs and clinical decision tools ie heart score, Chads2Vasc2 etc:1}  {Document your independent review of radiology images, and any outside records:1} {Document your discussion with family members, caretakers, and with consultants:1} {Document social determinants of health affecting pt's care:1} {Document your decision making why or why not admission, treatments were needed:1} Final Clinical Impression(s) / ED Diagnoses Final diagnoses:  Syncope and collapse    Rx / DC Orders ED Discharge Orders     None

## 2022-07-21 NOTE — Discharge Instructions (Signed)
Patient with a heart block.  She will be transferred to [facility]

## 2022-07-21 NOTE — ED Notes (Signed)
Verbal report given to [facility] and ED at [facility] for transfer, patient and family aware.

## 2022-07-22 ENCOUNTER — Inpatient Hospital Stay (HOSPITAL_COMMUNITY): Payer: PPO

## 2022-07-22 DIAGNOSIS — I442 Atrioventricular block, complete: Secondary | ICD-10-CM | POA: Diagnosis not present

## 2022-07-22 DIAGNOSIS — R001 Bradycardia, unspecified: Secondary | ICD-10-CM

## 2022-07-22 LAB — CBC
HCT: 38.9 % (ref 36.0–46.0)
Hemoglobin: 12.4 g/dL (ref 12.0–15.0)
MCH: 29 pg (ref 26.0–34.0)
MCHC: 31.9 g/dL (ref 30.0–36.0)
MCV: 90.9 fL (ref 80.0–100.0)
Platelets: 272 10*3/uL (ref 150–400)
RBC: 4.28 MIL/uL (ref 3.87–5.11)
RDW: 14 % (ref 11.5–15.5)
WBC: 7.9 10*3/uL (ref 4.0–10.5)
nRBC: 0 % (ref 0.0–0.2)

## 2022-07-22 LAB — URINALYSIS, ROUTINE W REFLEX MICROSCOPIC
Bilirubin Urine: NEGATIVE
Glucose, UA: NEGATIVE mg/dL
Hgb urine dipstick: NEGATIVE
Ketones, ur: NEGATIVE mg/dL
Leukocytes,Ua: NEGATIVE
Nitrite: NEGATIVE
Protein, ur: NEGATIVE mg/dL
Specific Gravity, Urine: 1.01 (ref 1.005–1.030)
pH: 5 (ref 5.0–8.0)

## 2022-07-22 LAB — BASIC METABOLIC PANEL
Anion gap: 10 (ref 5–15)
BUN: 18 mg/dL (ref 8–23)
CO2: 26 mmol/L (ref 22–32)
Calcium: 9.6 mg/dL (ref 8.9–10.3)
Chloride: 106 mmol/L (ref 98–111)
Creatinine, Ser: 1.02 mg/dL — ABNORMAL HIGH (ref 0.44–1.00)
GFR, Estimated: 54 mL/min — ABNORMAL LOW (ref >60)
Glucose, Bld: 104 mg/dL — ABNORMAL HIGH (ref 70–99)
Potassium: 4.2 mmol/L (ref 3.5–5.1)
Sodium: 142 mmol/L (ref 135–145)

## 2022-07-22 LAB — ECHOCARDIOGRAM COMPLETE
Area-P 1/2: 3.77 cm2
Height: 62 in
S' Lateral: 3.2 cm
Single Plane A4C EF: 36.2 %
Weight: 2918.4 oz

## 2022-07-22 MED ORDER — SACUBITRIL-VALSARTAN 24-26 MG PO TABS
1.0000 | ORAL_TABLET | Freq: Two times a day (BID) | ORAL | Status: DC
  Administered 2022-07-22 – 2022-07-25 (×8): 1 via ORAL
  Filled 2022-07-22 (×8): qty 1

## 2022-07-22 NOTE — ED Notes (Signed)
Transported to [facility].

## 2022-07-22 NOTE — ED Notes (Signed)
ED TO INPATIENT HANDOFF REPORT  ED Nurse Name and Phone #: [phone]  S Name/Age/Gender Marisa Livingston 86 y.o. female Room/Bed: 010C/010C  Code Status   Code Status: Full Code  Home/SNF/Other Home Patient oriented to: self, place, time, and situation Is this baseline? Yes   Triage Complete: Triage complete  Chief Complaint Complete heart block [facility]) [I44.2]  Triage Note Pt brought in by EMS after she had a syncopal episode at home. States she was in [place] when she started to "feel woozy" and that the next thing she remembers is waking up in floor. EMS states that upon their arrival, pt's HR was 40 but soon came up to 60's without any intervention from them.  [facility] arrived from [facility] as transfer for cardiology consult post syncopal episode with symptomatic bradycardia. Pt had syncopal episode at home, EMS arrived to find patient bradycardic. Atropine administered at [facility]. HR maintaining in 30-35 bpm. Pt is alert and oriented, other vital signs within normal limits.   PTA Vitals HR 34 BP 140/84 SPO2 98% RA   Allergies Allergies  Allergen Reactions   Codeine Nausea Only   Levaquin [Levofloxacin In D5w]     Sick     Level of Care/Admitting Diagnosis ED Disposition     ED Disposition  Admit   Condition  --   Comment  Hospital Area: [place] [[id number]]  Level of Care: Progressive [102]  Admit to Progressive based on following criteria: CARDIOVASCULAR & THORACIC of moderate stability with acute coronary syndrome symptoms/low risk myocardial infarction/hypertensive urgency/arrhythmias/heart failure potentially compromising stability and stable post cardiovascular intervention patients.  May admit patient to [facility] or [facility] if equivalent level of care is available:: No  Covid Evaluation: Asymptomatic - no recent exposure (last 10 days) testing not required  Diagnosis: Complete heart block [facility]) [[id number]]  Admitting Physician: Doyne Keel  [phone]  Attending Physician: Freada Bergeron [[id number]]  Certification:: I certify this patient will need inpatient services for at least 2 midnights  Estimated Length of Stay: 3          B Medical/Surgery History Past Medical History:  Diagnosis Date   Allergic rhinitis    Hyperlipidemia    Insomnia    LBBB (left bundle branch block)    Osteopenia    Past Surgical History:  Procedure Laterality Date   COLONOSCOPY N/A 09/02/2015   Procedure: COLONOSCOPY;  Surgeon: Rogene Houston, MD;  Location: [facility] ENDO SUITE;  Service: Endoscopy;  Laterality: N/A;  1025 - moved to 2/9 @ 8:30 - Ann notified pt   POLYPECTOMY N/A 09/02/2015   Procedure: POLYPECTOMY;  Surgeon: Rogene Houston, MD;  Location: [facility] ENDO SUITE;  Service: Endoscopy;  Laterality: N/A;  Hepatic Flexure Polyp     A IV Location/Drains/Wounds Patient Lines/Drains/Airways Status     Active Line/Drains/Airways     Name Placement date Placement time Site Days   Peripheral IV 07/21/22 20 G Left Antecubital 07/21/22  1954  Antecubital  1   Peripheral IV 07/21/22 20 G 1" Right Antecubital 07/21/22  2037  Antecubital  1            Intake/Output Last 24 hours No intake or output data in the 24 hours ending 07/22/22 1450  Labs/Imaging Results for orders placed or performed during the hospital encounter of 07/21/22 (from the past 48 hour(s))  Troponin I (High Sensitivity)     Status: None   Collection Time: 07/21/22  8:05 PM  Result Value  Ref Range   Troponin I (High Sensitivity) 7 <18 ng/L    Comment: (NOTE) Elevated high sensitivity troponin I (hsTnI) values and significant  changes across serial measurements may suggest ACS but many other  chronic and acute conditions are known to elevate hsTnI results.  Refer to the "Links" section for chest pain algorithms and additional  guidance. Performed at [facility], [address], [place], [place] [address]   Hepatic function panel     Status: Abnormal    Collection Time: 07/21/22  8:05 PM  Result Value Ref Range   Total Protein 7.4 6.5 - 8.1 g/dL   Albumin 3.8 3.5 - 5.0 g/dL   AST 24 15 - 41 U/L   ALT 18 0 - 44 U/L   Alkaline Phosphatase 54 38 - 126 U/L   Total Bilirubin 0.3 0.3 - 1.2 mg/dL   Bilirubin, Direct 0.1 0.0 - 0.2 mg/dL   Indirect Bilirubin 0.2 (L) 0.3 - 0.9 mg/dL    Comment: Performed at [facility], [address] [place], [place] [address]  Basic metabolic panel     Status: Abnormal   Collection Time: 07/21/22  8:06 PM  Result Value Ref Range   Sodium 139 135 - 145 mmol/L   Potassium 3.8 3.5 - 5.1 mmol/L   Chloride 104 98 - 111 mmol/L   CO2 25 22 - 32 mmol/L   Glucose, Bld 158 (H) 70 - 99 mg/dL    Comment: Glucose reference range applies only to samples taken after fasting for at least 8 hours.   BUN 23 8 - 23 mg/dL   Creatinine, Ser 1.03 (H) 0.44 - 1.00 mg/dL   Calcium 9.5 8.9 - 10.3 mg/dL   GFR, Estimated 54 (L) >60 mL/min    Comment: (NOTE) Calculated using the CKD-EPI Creatinine Equation (2021)    Anion gap 10 5 - 15    Comment: Performed at [facility], [address] [place], [place] [address]  CBC     Status: None   Collection Time: 07/21/22  8:06 PM  Result Value Ref Range   WBC 8.8 4.0 - 10.5 K/uL   RBC 4.29 3.87 - 5.11 MIL/uL   Hemoglobin 12.4 12.0 - 15.0 g/dL   HCT 38.8 36.0 - 46.0 %   MCV 90.4 80.0 - 100.0 fL   MCH 28.9 26.0 - 34.0 pg   MCHC 32.0 30.0 - 36.0 g/dL   RDW 13.9 11.5 - 15.5 %   Platelets 256 150 - 400 K/uL   nRBC 0.0 0.0 - 0.2 %    Comment: Performed at [facility], [address], [place], [place] [address]  CBG monitoring, ED     Status: Abnormal   Collection Time: 07/21/22  8:14 PM  Result Value Ref Range   Glucose-Capillary 139 (H) 70 - 99 mg/dL    Comment: Glucose reference range applies only to samples taken after fasting for at least 8 hours.  Troponin I (High Sensitivity)     Status: None   Collection Time: 07/21/22  9:51 PM  Result Value Ref Range   Troponin I (High  Sensitivity) 15 <18 ng/L    Comment: (NOTE) Elevated high sensitivity troponin I (hsTnI) values and significant  changes across serial measurements may suggest ACS but many other  chronic and acute conditions are known to elevate hsTnI results.  Refer to the "Links" section for chest pain algorithms and additional  guidance. Performed at [facility], [address], [place], [place] [address]  Basic metabolic panel     Status: Abnormal   Collection Time: 07/22/22  5:52 AM  Result Value Ref Range   Sodium 142 135 - 145 mmol/L   Potassium 4.2 3.5 - 5.1 mmol/L   Chloride 106 98 - 111 mmol/L   CO2 26 22 - 32 mmol/L   Glucose, Bld 104 (H) 70 - 99 mg/dL    Comment: Glucose reference range applies only to samples taken after fasting for at least 8 hours.   BUN 18 8 - 23 mg/dL   Creatinine, Ser 1.02 (H) 0.44 - 1.00 mg/dL   Calcium 9.6 8.9 - 10.3 mg/dL   GFR, Estimated 54 (L) >60 mL/min    Comment: (NOTE) Calculated using the CKD-EPI Creatinine Equation (2021)    Anion gap 10 5 - 15    Comment: Performed at [facility] [address] [place] [address]  CBC     Status: None   Collection Time: 07/22/22  5:52 AM  Result Value Ref Range   WBC 7.9 4.0 - 10.5 K/uL   RBC 4.28 3.87 - 5.11 MIL/uL   Hemoglobin 12.4 12.0 - 15.0 g/dL   HCT 38.9 36.0 - 46.0 %   MCV 90.9 80.0 - 100.0 fL   MCH 29.0 26.0 - 34.0 pg   MCHC 31.9 30.0 - 36.0 g/dL   RDW 14.0 11.5 - 15.5 %   Platelets 272 150 - 400 K/uL   nRBC 0.0 0.0 - 0.2 %    Comment: Performed at [facility], [place] [address], [place], [place] [address]  Urinalysis, Routine w reflex microscopic Urine, In & Out Cath     Status: None   Collection Time: 07/22/22  7:51 AM  Result Value Ref Range   Color, Urine YELLOW YELLOW   APPearance CLEAR CLEAR   Specific Gravity, Urine 1.010 1.005 - 1.030   pH 5.0 5.0 - 8.0   Glucose, UA NEGATIVE NEGATIVE mg/dL   Hgb urine dipstick NEGATIVE NEGATIVE   Bilirubin Urine NEGATIVE NEGATIVE    Ketones, ur NEGATIVE NEGATIVE mg/dL   Protein, ur NEGATIVE NEGATIVE mg/dL   Nitrite NEGATIVE NEGATIVE   Leukocytes,Ua NEGATIVE NEGATIVE    Comment: Performed at [facility] [address], [place], [place] [address]   ECHOCARDIOGRAM COMPLETE  Result Date: 07/22/2022    ECHOCARDIOGRAM REPORT   Patient Name:   Marisa Livingston Date of Exam: 07/22/2022 Medical Rec #:  [id number]        Height:       62.0 in Accession #:    [id number]       Weight:       182.4 lb Date of Birth:  [birth year]        BSA:          1.838 m Patient Age:    70 years         BP:           94/74 mmHg Patient Gender: F                HR:           67 bpm. Exam Location:  Inpatient Procedure: 2D Echo Indications:    complete heart block  History:        Patient has prior history of Echocardiogram examinations, most                 recent 12/28/2020. Pacemaker, chronic kidney disease,  Arrythmias:Atrial Fibrillation and LBBB; Risk                 Factors:Dyslipidemia.  Sonographer:    Johny Chess RDCS Referring Phys: [id number] [place]  1. Left ventricular ejection fraction, by estimation, is 50 to 55%. The left ventricle has low normal function. The left ventricle has no regional wall motion abnormalities. Left ventricular diastolic parameters are consistent with Grade I diastolic dysfunction (impaired relaxation).  2. Right ventricular systolic function is normal. The right ventricular size is normal. There is normal pulmonary artery systolic pressure.  3. The mitral valve is normal in structure. No evidence of mitral valve regurgitation. No evidence of mitral stenosis.  4. The aortic valve is tricuspid. Aortic valve regurgitation is not visualized. No aortic stenosis is present.  5. The inferior vena cava is normal in size with greater than 50% respiratory variability, suggesting right atrial pressure of 3 mmHg. FINDINGS  Left Ventricle: Left ventricular ejection fraction, by estimation,  is 50 to 55%. The left ventricle has low normal function. The left ventricle has no regional wall motion abnormalities. The left ventricular internal cavity size was normal in size. There is no left ventricular hypertrophy. Left ventricular diastolic parameters are consistent with Grade I diastolic dysfunction (impaired relaxation). Right Ventricle: The right ventricular size is normal. No increase in right ventricular wall thickness. Right ventricular systolic function is normal. There is normal pulmonary artery systolic pressure. The tricuspid regurgitant velocity is 2.29 m/s, and  with an assumed right atrial pressure of 3 mmHg, the estimated right ventricular systolic pressure is 01.6 mmHg. Left Atrium: Left atrial size was normal in size. Right Atrium: Right atrial size was normal in size. Pericardium: There is no evidence of pericardial effusion. Mitral Valve: The mitral valve is normal in structure. No evidence of mitral valve regurgitation. No evidence of mitral valve stenosis. Tricuspid Valve: The tricuspid valve is normal in structure. Tricuspid valve regurgitation is trivial. No evidence of tricuspid stenosis. Aortic Valve: The aortic valve is tricuspid. Aortic valve regurgitation is not visualized. No aortic stenosis is present. Pulmonic Valve: The pulmonic valve was normal in structure. Pulmonic valve regurgitation is trivial. No evidence of pulmonic stenosis. Aorta: The aortic root is normal in size and structure. Venous: The inferior vena cava is normal in size with greater than 50% respiratory variability, suggesting right atrial pressure of 3 mmHg. IAS/Shunts: No atrial level shunt detected by color flow Doppler.  LEFT VENTRICLE PLAX 2D LVIDd:         4.20 cm     Diastology LVIDs:         3.20 cm     LV e' medial:    5.87 cm/s LV PW:         1.00 cm     LV E/e' medial:  11.4 LV IVS:        0.90 cm     LV e' lateral:   6.42 cm/s LVOT diam:     1.80 cm     LV E/e' lateral: 10.4 LV SV:         38 LV SV  Index:   21 LVOT Area:     2.54 cm  LV Volumes (MOD) LV vol d, MOD A4C: 44.8 ml LV vol s, MOD A4C: 28.6 ml LV SV MOD A4C:     44.8 ml RIGHT VENTRICLE             IVC RV Basal diam:  2.50 cm  IVC diam: 1.40 cm RV S prime:     10.80 cm/s TAPSE (M-mode): 1.3 cm LEFT ATRIUM           Index        RIGHT ATRIUM           Index LA diam:      3.40 cm 1.85 cm/m   RA Area:     11.00 cm LA Vol (A4C): 29.8 ml 16.21 ml/m  RA Volume:   22.40 ml  12.19 ml/m  AORTIC VALVE LVOT Vmax:   75.80 cm/s LVOT Vmean:  48.700 cm/s LVOT VTI:    0.149 m  AORTA Ao Root diam: 3.00 cm Ao Asc diam:  3.00 cm MITRAL VALVE               TRICUSPID VALVE MV Area (PHT): 3.77 cm    TR Peak grad:   21.0 mmHg MV Decel Time: 201 msec    TR Vmax:        229.00 cm/s MV E velocity: 67.00 cm/s MV A velocity: 92.70 cm/s  SHUNTS MV E/A ratio:  0.72        Systemic VTI:  0.15 m                            Systemic Diam: 1.80 cm Candee Furbish MD Electronically signed by Candee Furbish MD Signature Date/Time: 07/22/2022/12:13:44 PM    Final    DG Chest Port 1 View  Result Date: 07/21/2022 CLINICAL DATA:  Weak EXAM: PORTABLE CHEST 1 VIEW.  Patient is rotated. COMPARISON:  None Available. FINDINGS: Prominent cardiac silhouette due to [facility] portable technique. The heart and mediastinal contours are within normal limits. No focal consolidation. Coarsened and prominent markings with no overt pulmonary edema. No pleural effusion. No pneumothorax. No acute osseous abnormality. IMPRESSION: Findings suggestive of viral bronchiolitis versus reactive airway disease. Electronically Signed   By: Iven Finn M.D.   On: 07/21/2022 20:22    Pending Labs Unresulted Labs (From admission, onward)     Start     Ordered   07/22/22 1017  Basic metabolic panel  Daily,   R     Comments: As Scheduled for 5 days    07/21/22 2340            Vitals/Pain Today's Vitals   07/22/22 1000 07/22/22 1100 07/22/22 1227 07/22/22 1400  BP: (!) 109/49 122/68  (!) 120/51   Pulse: 77 66  86  Resp: 19 (!) 21  17  Temp:   98.7 F (37.1 C)   TempSrc:   Oral   SpO2: 95% 96%  94%  Weight:      Height:      PainSc:        Isolation Precautions No active isolations  Medications Medications  simvastatin (ZOCOR) tablet 10 mg (10 mg Oral Given 07/22/22 0923)  sodium chloride flush (NS) 0.9 % injection 3 mL (3 mLs Intravenous Given 07/22/22 0930)  sodium chloride flush (NS) 0.9 % injection 3 mL (has no administration in time range)  0.9 %  sodium chloride infusion (has no administration in time range)  acetaminophen (TYLENOL) tablet 650 mg (has no administration in time range)  ondansetron (ZOFRAN) injection 4 mg (has no administration in time range)  heparin injection 5,000 Units (5,000 Units Subcutaneous Given 07/22/22 1414)  sacubitril-valsartan (ENTRESTO) 24-26 mg per tablet (1 tablet Oral Given 07/22/22 1025)  sodium chloride 0.9 % bolus 500 mL (  0 mLs Intravenous Stopped 07/21/22 2151)  atropine injection 1 mg (1 mg Intravenous Given 07/21/22 2018)    Mobility Walks very little High fall risk   Focused Assessments Cardiac Assessment Handoff:    No results found for: "CKTOTAL", "CKMB", "CKMBINDEX", "TROPONINI" No results found for: "DDIMER" Does the Patient currently have chest pain? No    R Recommendations: See Admitting Provider Note  Report given to:   Additional Notes:

## 2022-07-22 NOTE — H&P (Signed)
Cardiology Admission History and Physical   Patient ID: Marisa Livingston MRN: [id number]; DOB: 11-Feb-1936   Admission date: 07/21/2022  PCP:  Celene Squibb, MD   [place] [facility] Cardiologist:  Candee Furbish, MD        Chief Complaint:  syncopal episode  Patient Profile:   Marisa Livingston is a 86 y.o. female with prior heart failure but recovered EF, hyperlipidemia who is being seen 07/22/2022 for the evaluation of syncopal episode and complete heart block.  History of Present Illness:   Marisa Livingston is an 86 year old female with a history of prior cardiomyopathy but now recovered EF, hyperlipidemia who presented to the emergency department after a syncopal event.  Noted that she had been in her usual state of health up until yesterday evening when she had a syncopal event fell to the floor and did not recall anything.  No prodromal symptoms.  She has not been having any chest pain or shortness of breath.  She she notes that prior to this event, she had felt overall generally very well.  On the Emergency Department was noted to have heart rates in the 30s with complete heart block.  Transferred from [facility] emergency department to our emergency department for escalated care.  While in the urgency department, did have an episode where the heart rate dropped and she felt lightheaded again.  Subsequently, heart rate recovered and reverted to sinus rhythm.   Past Medical History:  Diagnosis Date   Allergic rhinitis    Hyperlipidemia    Insomnia    LBBB (left bundle branch block)    Osteopenia     Past Surgical History:  Procedure Laterality Date   COLONOSCOPY N/A 09/02/2015   Procedure: COLONOSCOPY;  Surgeon: Rogene Houston, MD;  Location: [facility];  Service: Endoscopy;  Laterality: N/A;  1025 - moved to 2/9 @ 8:30 - Ann notified pt   POLYPECTOMY N/A 09/02/2015   Procedure: POLYPECTOMY;  Surgeon: Rogene Houston, MD;  Location: [facility];  Service:  Endoscopy;  Laterality: N/A;  Hepatic Flexure Polyp     Medications Prior to Admission: Prior to Admission medications   Medication Sig Start Date End Date Taking? Authorizing Provider  Calcium Citrate (CITRACAL PO) Take by mouth.    [provider]  Loratadine (CLARITIN PO) Take by mouth.    [provider]  metoprolol succinate (TOPROL-XL) 50 MG 24 hr tablet Take 1 tablet (50 mg total) by mouth daily. 07/14/22   Jerline Pain, MD  Multiple Vitamins-Minerals (MULTIVITAMIN WITH MINERALS) tablet Take 1 tablet by mouth daily.    [provider]  sacubitril-valsartan (ENTRESTO) 24-26 MG TAKE ONE TABLET BY MOUTH TWO TIMES DAILY. 05/24/22   Jerline Pain, MD  simvastatin (ZOCOR) 10 MG tablet Take 10 mg by mouth daily.    [provider]     Allergies:    Allergies  Allergen Reactions   Codeine Nausea Only   Levaquin [Levofloxacin In D5w]     Sick     Social History:   Social History   Socioeconomic History   Marital status: Married    Spouse name: Not on file   Number of children: Not on file   Years of education: Not on file   Highest education level: Not on file  Occupational History   Not on file  Tobacco Use   Smoking status: Never   Smokeless tobacco: Never  Vaping Use   Vaping Use: Never  used  Substance and Sexual Activity   Alcohol use: No   Drug use: No   Sexual activity: Not Currently    Birth control/protection: Post-menopausal  Other Topics Concern   Not on file  Social History Narrative   Lives in alone in [place]   Retired Biochemist, clinical for [employer] Determinants of Radio broadcast assistant Strain: Not on [employer] Insecurity: Not on file  Transportation Needs: Not on file  Physical Activity: Not on file  Stress: Not on file  Social Connections: Not on file  Intimate Partner Violence: Not on file    Family History:   The patient's family history includes Cancer in her brother;  Heart Problems in her mother.    ROS:  Please see the history of present illness.  All other ROS reviewed and negative.     Physical Exam/Data:   Vitals:   07/22/22 0000 07/22/22 0015 07/22/22 0030 07/22/22 0045  BP: (!) 111/46 (!) 101/40 (!) 124/48 (!) 104/48  Pulse: (!) 32 (!) 32 (!) 33 (!) 51  Resp: '19 19 20 18  '$ Temp:      TempSrc:      SpO2: 92% 92% 95% 95%  Weight:      Height:       No intake or output data in the 24 hours ending 07/22/22 0248    07/21/2022    7:49 PM 07/21/2022    7:48 PM 06/05/2022    9:26 AM  Last 3 Weights  Weight (lbs) 182 lb 6.4 oz 177 lb 178 lb  Weight (kg) 82.736 kg 80.287 kg 80.74 kg     Body mass index is 33.36 kg/m.  General:  Well nourished, well developed, in no acute distress HEENT: normal Neck: no JVD Vascular: No carotid bruits; Distal pulses 2+ bilaterally   Cardiac:  normal S1, S2; bradycardic. No murmurs Lungs:  clear to auscultation bilaterally, no wheezing, rhonchi or rales  Abd: soft, nontender, no hepatomegaly  Ext: no edema Musculoskeletal:  No deformities, BUE and BLE strength normal and equal Skin: warm and dry  Neuro:  CNs 2-12 intact, no focal abnormalities noted Psych:  Normal affect    EKG:  The ECG that was done  was personally reviewed and demonstrates complete heart block on the initial ECG  Relevant CV Studies: Prior echo in 2022 with normal EF  Laboratory Data:  High Sensitivity Troponin:   Recent Labs  Lab 07/21/22 2005 07/21/22 2151  TROPONINIHS 7 15      Chemistry Recent Labs  Lab 07/21/22 2006  NA 139  K 3.8  CL 104  CO2 25  GLUCOSE 158*  BUN 23  CREATININE 1.03*  CALCIUM 9.5  GFRNONAA 54*  ANIONGAP 10    Recent Labs  Lab 07/21/22 2005  PROT 7.4  ALBUMIN 3.8  AST 24  ALT 18  ALKPHOS 54  BILITOT 0.3   Lipids No results for input(s): "CHOL", "TRIG", "HDL", "LABVLDL", "LDLCALC", "CHOLHDL" in the last 168 hours. Hematology Recent Labs  Lab 07/21/22 2006  WBC 8.8  RBC  4.29  HGB 12.4  HCT 38.8  MCV 90.4  MCH 28.9  MCHC 32.0  RDW 13.9  PLT 256   Thyroid No results for input(s): "TSH", "FREET4" in the last 168 hours. BNPNo results for input(s): "BNP", "PROBNP" in the last 168 hours.  DDimer No results for input(s): "DDIMER" in the last 168 hours.   Radiology/Studies:  [facility] Chest Port 1 View  Result  Date: 07/21/2022 CLINICAL DATA:  Weak EXAM: PORTABLE CHEST 1 VIEW.  Patient is rotated. COMPARISON:  None Available. FINDINGS: Prominent cardiac silhouette due to AP portable technique. The heart and mediastinal contours are within normal limits. No focal consolidation. Coarsened and prominent markings with no overt pulmonary edema. No pleural effusion. No pneumothorax. No acute osseous abnormality. IMPRESSION: Findings suggestive of viral bronchiolitis versus reactive airway disease. Electronically Signed   By: Iven Finn M.D.   On: 07/21/2022 20:22     Assessment and Plan:   Symptomatic bradycardia, complete heart block.  Initially presented with complete heart block with a syncopal event.  She has reverted to normal sinus rhythm however this is still very concerning given the degree of her heart block.  I think she is ultimately going to warrant a pacemaker, however we will watch her on telemetry over the weekend.  Holding her beta-blocker to avoid any AV nodal suppression.  Not have any chest pains I do not believe that she needs an ischemic workup.  Will need echo to confirm LVEF.  Plan: - Hold metoprolol - Monitor on telemetry - Monitor electrolytes closely - Will have electrophysiology see her  2.    Prior to myopathy Previously had depressed EF.  Most recently had recovery.  Has been on Entresto and metoprolol.  Holding metoprolol as above for bradycardia.  Holding Entresto due to her blood pressure being on the lower end and did not want any hemodynamic compromise while she is in CHF view.   3.     Anemia Continue home simvastatin   Risk  Assessment/Risk Scores:  { Severity of Illness: The appropriate patient status for this patient is INPATIENT. Inpatient status is judged to be reasonable and necessary in order to provide the required intensity of service to ensure the patient's safety. The patient's presenting symptoms, physical exam findings, and initial radiographic and laboratory data in the context of their chronic comorbidities is felt to place them at high risk for further clinical deterioration. Furthermore, it is not anticipated that the patient will be medically stable for discharge from the hospital within 2 midnights of admission.   * I certify that at the point of admission it is my clinical judgment that the patient will require inpatient hospital care spanning beyond 2 midnights from the point of admission due to high intensity of service, high risk for further deterioration and high frequency of surveillance required.*   For questions or updates, please contact [place] Please consult [email or web address] for contact info under     Signed, Doyne Keel, MD  07/22/2022 2:48 AM

## 2022-07-22 NOTE — Progress Notes (Signed)
  Echocardiogram 2D Echocardiogram has been performed.  Marisa Livingston 07/22/2022, 8:36 AM

## 2022-07-22 NOTE — ED Provider Notes (Signed)
  Physical Exam  BP (!) 108/59 (BP Location: Right Arm)   Pulse 68   Temp 98.3 F (36.8 C) (Oral)   Resp 18   Ht '5\' 2"'$  (1.575 m)   Wt 82.7 kg   SpO2 94%   BMI 33.36 kg/m   Physical Exam Vitals and nursing note reviewed.  Constitutional:      General: She is not in acute distress.    Appearance: She is well-developed.  HENT:     Head: Normocephalic and atraumatic.  Eyes:     Conjunctiva/sclera: Conjunctivae normal.  Cardiovascular:     Rate and Rhythm: Bradycardia present. Rhythm irregular.     Heart sounds: No murmur heard. Pulmonary:     Effort: Pulmonary effort is normal. No respiratory distress.  Musculoskeletal:        General: No swelling.     Cervical back: Neck supple.  Skin:    General: Skin is warm and dry.     Capillary Refill: Capillary refill takes less than 2 seconds.  Neurological:     Mental Status: She is alert.  Psychiatric:        Mood and Affect: Mood normal.     Procedures  .Critical Care  Performed by: Teressa Lower, MD Authorized by: Teressa Lower, MD   Critical care provider statement:    Critical care time (minutes):  30   Critical care was necessary to treat or prevent imminent or life-threatening deterioration of the following conditions:  Cardiac failure   Critical care was time spent personally by me on the following activities:  Development of treatment plan with patient or surrogate, discussions with consultants, evaluation of patient's response to treatment, examination of patient, ordering and review of laboratory studies, ordering and review of radiographic studies, ordering and performing treatments and interventions, pulse oximetry, re-evaluation of patient's condition and review of old charts   ED Course / MDM    Medical Decision Making Amount and/or Complexity of Data Reviewed Labs: ordered. Radiology: ordered.  Risk Prescription drug management. Decision regarding hospitalization.   Patient received in transfer.   Symptomatic third-degree heart block but hemodynamically stable here in the ER.  I spoke with the cardiology fellow on-call who came and evaluated the patient and admitted the patient to cardiology.  While in the ER, the patient had a significant episode of bradycardia with rates in the low 20s and spontaneously had return of function from the sinus node.  Patient remains hemodynamically stable and admitted to cardiology.       Teressa Lower, MD 07/22/22 [phone]

## 2022-07-22 NOTE — Progress Notes (Signed)
Rounding Note    Patient Name: Verl Dicker Date of Encounter: 07/22/2022  [facility] Cardiologist: Candee Furbish, MD   Subjective   She feels well, eating breakfast with her son. Discussed Ep will plan to see tomorrow. No urgent need for pacing  Inpatient Medications    Scheduled Meds:  heparin  5,000 Units Subcutaneous Q8H   simvastatin  10 mg Oral Daily   sodium chloride flush  3 mL Intravenous Q12H   Continuous Infusions:  sodium chloride     PRN Meds: sodium chloride, acetaminophen, ondansetron (ZOFRAN) IV, sodium chloride flush   Vital Signs    Vitals:   07/22/22 0630 07/22/22 0715 07/22/22 0730 07/22/22 0751  BP: (!) 100/37 130/85 94/74   Pulse: 63 83 88   Resp: 20 17 (!) 22   Temp:    100.3 F (37.9 C)  TempSrc:      SpO2: 97% 99% 98%   Weight:      Height:       No intake or output data in the 24 hours ending 07/22/22 0927    07/21/2022    7:49 PM 07/21/2022    7:48 PM 06/05/2022    9:26 AM  Last 3 Weights  Weight (lbs) 182 lb 6.4 oz 177 lb 178 lb  Weight (kg) 82.736 kg 80.287 kg 80.74 kg      Telemetry    NSR  - Personally Reviewed  ECG    Sinus rhythm, LBBB - Personally Reviewed  Physical Exam   Vitals:   07/22/22 0751 07/22/22 0930  BP:    Pulse:  65  Resp:  20  Temp: 100.3 F (37.9 C)   SpO2:  96%    GEN: No acute distress.   Neck: No JVD Cardiac: RRR, no murmurs, rubs, or gallops.  Respiratory: Clear to auscultation bilaterally. GI: Soft, nontender, non-distended  MS: No edema; No deformity. Neuro:  Nonfocal  Psych: Normal affect   Labs    High Sensitivity Troponin:   Recent Labs  Lab 07/21/22 2005 07/21/22 2151  TROPONINIHS 7 15     Chemistry Recent Labs  Lab 07/21/22 2005 07/21/22 2006 07/22/22 0552  NA  --  139 142  K  --  3.8 4.2  CL  --  104 106  CO2  --  25 26  GLUCOSE  --  158* 104*  BUN  --  23 18  CREATININE  --  1.03* 1.02*  CALCIUM  --  9.5 9.6  PROT 7.4  --   --   ALBUMIN  3.8  --   --   AST 24  --   --   ALT 18  --   --   ALKPHOS 54  --   --   BILITOT 0.3  --   --   GFRNONAA  --  54* 54*  ANIONGAP  --  10 10    Lipids No results for input(s): "CHOL", "TRIG", "HDL", "LABVLDL", "LDLCALC", "CHOLHDL" in the last 168 hours.  Hematology Recent Labs  Lab 07/21/22 2006 07/22/22 0552  WBC 8.8 7.9  RBC 4.29 4.28  HGB 12.4 12.4  HCT 38.8 38.9  MCV 90.4 90.9  MCH 28.9 29.0  MCHC 32.0 31.9  RDW 13.9 14.0  PLT 256 272   Thyroid No results for input(s): "TSH", "FREET4" in the last 168 hours.  BNPNo results for input(s): "BNP", "PROBNP" in the last 168 hours.  DDimer No results for input(s): "DDIMER" in the last 168  hours.   Radiology    DG Chest Port 1 View  Result Date: 07/21/2022 CLINICAL DATA:  Weak EXAM: PORTABLE CHEST 1 VIEW.  Patient is rotated. COMPARISON:  None Available. FINDINGS: Prominent cardiac silhouette due to AP portable technique. The heart and mediastinal contours are within normal limits. No focal consolidation. Coarsened and prominent markings with no overt pulmonary edema. No pleural effusion. No pneumothorax. No acute osseous abnormality. IMPRESSION: Findings suggestive of viral bronchiolitis versus reactive airway disease. Electronically Signed   By: Iven Finn M.D.   On: 07/21/2022 20:22    Cardiac Studies   Echo read pending; EF is preserved  Patient Profile     Roni Friberg Pancake is a 86 y.o. female with prior heart failure but recovered EF, hyperlipidemia who is being seen 07/22/2022 for the evaluation of syncopal episode and complete heart block.   Assessment & Plan    CHB: escape rhythm RBBB morphology. Had LBBB as well. Likely will be candidate for PPM in the future, may not be inpatient - after BB washout, she is conducting  - stop metoprolol - plan is for EP to see for final recommendations   HF with recovered EF: can continue home entresto  HLD: continue home simvastatin  For questions or updates, please  contact [place] Please consult [email or web address] for contact info under        Signed, Janina Mayo, MD  07/22/2022, 9:27 AM

## 2022-07-22 NOTE — ED Notes (Signed)
RN noted decrease in heart rate to 15-20 bpm on monitor, went to room to assess pt who reported she felt lightheaded and dizzy. RN noted extended pauses on monitor then conversion to sinus rhythm  (80-90 bpm). EKG obtained, Kommor MD made aware and came to bedside to assess pt. Symptom resolution occurred with conversion to sinus rhythm.

## 2022-07-22 NOTE — Progress Notes (Signed)
D/w Dr. Lovena Le, [place]. He recommends to follow with metoprolol washout. He will plan to see tomorrow for more definitive recommendations following washout.

## 2022-07-23 DIAGNOSIS — I442 Atrioventricular block, complete: Secondary | ICD-10-CM | POA: Diagnosis not present

## 2022-07-23 LAB — BASIC METABOLIC PANEL
Anion gap: 9 (ref 5–15)
BUN: 17 mg/dL (ref 8–23)
CO2: 24 mmol/L (ref 22–32)
Calcium: 8.9 mg/dL (ref 8.9–10.3)
Chloride: 107 mmol/L (ref 98–111)
Creatinine, Ser: 1.01 mg/dL — ABNORMAL HIGH (ref 0.44–1.00)
GFR, Estimated: 55 mL/min — ABNORMAL LOW (ref >60)
Glucose, Bld: 105 mg/dL — ABNORMAL HIGH (ref 70–99)
Potassium: 3.8 mmol/L (ref 3.5–5.1)
Sodium: 140 mmol/L (ref 135–145)

## 2022-07-23 MED ORDER — ORAL CARE MOUTH RINSE: 15.0000 mL | OROMUCOSAL | Status: DC | PRN

## 2022-07-23 NOTE — Consult Note (Signed)
Cardiology Consultation   Patient ID: GLEN BLATCHLEY MRN: [id number]; DOB: 1935/08/11  Admit date: 07/21/2022 Date of Consult: 07/23/2022  PCP:  Celene Squibb, MD   [facility] Cardiologist:  Candee Furbish, MD        Patient Profile:   Marisa Livingston is a 86 y.o. female with a hx of HTN and LBBB who is being seen 07/23/2022 for the evaluation of Stokes Adams attacks at the request of Dr. Johney Frame.  History of Present Illness:   Marisa Livingston is known to me as I took care of her mother years ago. She presented with a Stokes Adams attack and was found to be in CHB. Her conduction has improved though she has still had occaisional episodes of Mobitz 2, second degree AV block over 2 days off of her beta blocker. At baseline she has LBBB. She denies anginal symptoms. She had minimal warning prior to her episode of syncope.    Past Medical History:  Diagnosis Date   Allergic rhinitis    Hyperlipidemia    Insomnia    LBBB (left bundle branch block)    Osteopenia     Past Surgical History:  Procedure Laterality Date   COLONOSCOPY N/A 09/02/2015   Procedure: COLONOSCOPY;  Surgeon: Rogene Houston, MD;  [facility];  Service: Endoscopy;  Laterality: N/A;  1025 - moved to 2/9 @ 8:30 - Ann notified pt   POLYPECTOMY N/A 09/02/2015   Procedure: POLYPECTOMY;  Surgeon: Rogene Houston, MD;  Location: [facility];  Service: Endoscopy;  Laterality: N/A;  Hepatic Flexure Polyp     Home Medications:  Prior to Admission medications   Medication Sig Start Date End Date Taking? Authorizing Provider  acetaminophen (TYLENOL) 325 MG tablet Take 325-650 mg by mouth every 6 (six) hours as needed for mild pain, fever or headache.   Yes [provider]  Calcium Citrate (CITRACAL PO) Take 1 tablet by mouth daily.   Yes [provider]  metoprolol succinate (TOPROL-XL) 50 MG 24 hr tablet Take 1 tablet (50 mg total) by mouth daily. 07/14/22  Yes Jerline Pain, MD  Multiple Vitamins-Minerals (MULTIVITAMIN WITH MINERALS) tablet Take 1 tablet by mouth daily.   Yes [provider]  sacubitril-valsartan (ENTRESTO) 24-26 MG TAKE ONE TABLET BY MOUTH TWO TIMES DAILY. Patient taking differently: Take 1 tablet by mouth 2 (two) times daily. 05/24/22  Yes Jerline Pain, MD  simvastatin (ZOCOR) 10 MG tablet Take 10 mg by mouth daily.   Yes [provider]    Inpatient Medications: Scheduled Meds:  heparin  5,000 Units Subcutaneous Q8H   sacubitril-valsartan  1 tablet Oral BID   simvastatin  10 mg Oral Daily   sodium chloride flush  3 mL Intravenous Q12H   Continuous Infusions:  sodium chloride     PRN Meds: sodium chloride, acetaminophen, ondansetron (ZOFRAN) IV, sodium chloride flush  Allergies:    Allergies  Allergen Reactions   Codeine Nausea Only   Levaquin [Levofloxacin In D5w]     Sick     Social History:   Social History   Socioeconomic History   Marital status: Married    Spouse name: Not on file   Number of children: Not on file   Years of education: Not on file   Highest education level: Not on file  Occupational History   Not on file  Tobacco Use   Smoking status: Never   Smokeless tobacco: Never  Vaping Use   Vaping Use: Never used  Substance and Sexual Activity   Alcohol use: No   Drug use: No   Sexual activity: Not Currently    Birth control/protection: Post-menopausal  Other Topics Concern   Not on file  Social History Narrative   Lives in alone in [place]   Retired Biochemist, clinical for [employer] Strain: Not on file  Food Insecurity: Not on file  Transportation Needs: Not on file  Physical Activity: Not on file  Stress: Not on file  Social Connections: Not on file  Intimate Partner Violence: Not on file    Family History:    Family History  Problem Relation Age of Onset   Heart Problems Mother         had a pacemaker   Cancer Brother      ROS:  Please see the history of present illness.   All other ROS reviewed and negative.     Physical Exam/Data:   Vitals:   07/22/22 1511 07/22/22 1936 07/23/22 0022 07/23/22 0404  BP: 124/61 (!) 114/57 113/60 108/62  Pulse: 63 86 94 85  Resp: '18 20 15 19  '$ Temp: 97.6 F (36.4 C) 98 F (36.7 C) 98.1 F (36.7 C) 97.7 F (36.5 C)  TempSrc: Oral Oral Oral Oral  SpO2: 97% 97% 98% 97%  Weight: 81.1 kg  82.5 kg   Height: '5\' 2"'$  (1.575 m)       Intake/Output Summary (Last 24 hours) at 07/23/2022 1016 Last data filed at 07/23/2022 0900 Gross per 24 hour  Intake 240 ml  Output --  Net 240 ml      07/23/2022   12:22 AM 07/22/2022    3:11 PM 07/21/2022    7:49 PM  Last 3 Weights  Weight (lbs) 181 lb 14.1 oz 178 lb 12.7 oz 182 lb 6.4 oz  Weight (kg) 82.5 kg 81.1 kg 82.736 kg     Body mass index is 33.27 kg/m.  General:  Well nourished, well developed, in no acute distress HEENT: normal Neck: no JVD Vascular: No carotid bruits; Distal pulses 2+ bilaterally Cardiac:  normal S1, S2; RRR; no murmur split S2. Lungs:  clear to auscultation bilaterally, no wheezing, rhonchi or rales  Abd: soft, nontender, no hepatomegaly  Ext: no edema Musculoskeletal:  No deformities, BUE and BLE strength normal and equal Skin: warm and dry  Neuro:  CNs 2-12 intact, no focal abnormalities noted Psych:  Normal affect   EKG:  The EKG was personally reviewed and demonstrates:  NSR with CHB, now NSR with LBBB Telemetry:  Telemetry was personally reviewed and demonstrates:  nsr with rare episodes of Mobitz 2, second degree AV block.  Relevant CV Studies: 2D echo reviewed  Laboratory Data:  High Sensitivity Troponin:   Recent Labs  Lab 07/21/22 2005 07/21/22 2151  TROPONINIHS 7 15     Chemistry Recent Labs  Lab 07/21/22 2006 07/22/22 0552 07/23/22 0036  NA 139 142 140  K 3.8 4.2 3.8  CL 104 106 107  CO2 '25 26 24  '$ GLUCOSE 158* 104* 105*   BUN '23 18 17  '$ CREATININE 1.03* 1.02* 1.01*  CALCIUM 9.5 9.6 8.9  GFRNONAA 54* 54* 55*  ANIONGAP '10 10 9    '$ Recent Labs  Lab 07/21/22 2005  PROT 7.4  ALBUMIN 3.8  AST 24  ALT 18  ALKPHOS 54  BILITOT 0.3   Lipids No results for input(s): "CHOL", "  TRIG", "HDL", "LABVLDL", "LDLCALC", "CHOLHDL" in the last 168 hours.  Hematology Recent Labs  Lab 07/21/22 2006 07/22/22 0552  WBC 8.8 7.9  RBC 4.29 4.28  HGB 12.4 12.4  HCT 38.8 38.9  MCV 90.4 90.9  MCH 28.9 29.0  MCHC 32.0 31.9  RDW 13.9 14.0  PLT 256 272   Thyroid No results for input(s): "TSH", "FREET4" in the last 168 hours.  BNPNo results for input(s): "BNP", "PROBNP" in the last 168 hours.  DDimer No results for input(s): "DDIMER" in the last 168 hours.   Radiology/Studies:  ECHOCARDIOGRAM COMPLETE  Result Date: 07/22/2022    ECHOCARDIOGRAM REPORT   Patient Name:   Verl Dicker Date of Exam: 07/22/2022 Medical Rec #:  [id number]        Height:       62.0 in Accession #:    [id number]       Weight:       182.4 lb Date of Birth:  Jan 30, 1936        BSA:          1.838 m Patient Age:    39 years         BP:           94/74 mmHg Patient Gender: F                HR:           67 bpm. Exam Location:  Inpatient Procedure: 2D Echo Indications:    complete heart block  History:        Patient has prior history of Echocardiogram examinations, most                 recent 12/28/2020. Pacemaker, chronic kidney disease,                 Arrythmias:Atrial Fibrillation and LBBB; Risk                 Factors:Dyslipidemia.  Sonographer:    Johny Chess RDCS Referring Phys: [id number] Reynolds  1. Left ventricular ejection fraction, by estimation, is 50 to 55%. The left ventricle has low normal function. The left ventricle has no regional wall motion abnormalities. Left ventricular diastolic parameters are consistent with Grade I diastolic dysfunction (impaired relaxation).  2. Right ventricular systolic function is  normal. The right ventricular size is normal. There is normal pulmonary artery systolic pressure.  3. The mitral valve is normal in structure. No evidence of mitral valve regurgitation. No evidence of mitral stenosis.  4. The aortic valve is tricuspid. Aortic valve regurgitation is not visualized. No aortic stenosis is present.  5. The inferior vena cava is normal in size with greater than 50% respiratory variability, suggesting right atrial pressure of 3 mmHg. FINDINGS  Left Ventricle: Left ventricular ejection fraction, by estimation, is 50 to 55%. The left ventricle has low normal function. The left ventricle has no regional wall motion abnormalities. The left ventricular internal cavity size was normal in size. There is no left ventricular hypertrophy. Left ventricular diastolic parameters are consistent with Grade I diastolic dysfunction (impaired relaxation). Right Ventricle: The right ventricular size is normal. No increase in right ventricular wall thickness. Right ventricular systolic function is normal. There is normal pulmonary artery systolic pressure. The tricuspid regurgitant velocity is 2.29 m/s, and  with an assumed right atrial pressure of 3 mmHg, the estimated right ventricular systolic pressure is 62.6 mmHg. Left Atrium: Left atrial size was normal in size.  Right Atrium: Right atrial size was normal in size. Pericardium: There is no evidence of pericardial effusion. Mitral Valve: The mitral valve is normal in structure. No evidence of mitral valve regurgitation. No evidence of mitral valve stenosis. Tricuspid Valve: The tricuspid valve is normal in structure. Tricuspid valve regurgitation is trivial. No evidence of tricuspid stenosis. Aortic Valve: The aortic valve is tricuspid. Aortic valve regurgitation is not visualized. No aortic stenosis is present. Pulmonic Valve: The pulmonic valve was normal in structure. Pulmonic valve regurgitation is trivial. No evidence of pulmonic stenosis. Aorta: The  aortic root is normal in size and structure. Venous: The inferior vena cava is normal in size with greater than 50% respiratory variability, suggesting right atrial pressure of 3 mmHg. IAS/Shunts: No atrial level shunt detected by color flow Doppler.  LEFT VENTRICLE PLAX 2D LVIDd:         4.20 cm     Diastology LVIDs:         3.20 cm     LV e' medial:    5.87 cm/s LV PW:         1.00 cm     LV E/e' medial:  11.4 LV IVS:        0.90 cm     LV e' lateral:   6.42 cm/s LVOT diam:     1.80 cm     LV E/e' lateral: 10.4 LV SV:         38 LV SV Index:   21 LVOT Area:     2.54 cm  LV Volumes (MOD) LV vol d, MOD A4C: 44.8 ml LV vol s, MOD A4C: 28.6 ml LV SV MOD A4C:     44.8 ml RIGHT VENTRICLE             IVC RV Basal diam:  2.50 cm     IVC diam: 1.40 cm RV S prime:     10.80 cm/s TAPSE (M-mode): 1.3 cm LEFT ATRIUM           Index        RIGHT ATRIUM           Index LA diam:      3.40 cm 1.85 cm/m   RA Area:     11.00 cm LA Vol (A4C): 29.8 ml 16.21 ml/m  RA Volume:   22.40 ml  12.19 ml/m  AORTIC VALVE LVOT Vmax:   75.80 cm/s LVOT Vmean:  48.700 cm/s LVOT VTI:    0.149 m  AORTA Ao Root diam: 3.00 cm Ao Asc diam:  3.00 cm MITRAL VALVE               TRICUSPID VALVE MV Area (PHT): 3.77 cm    TR Peak grad:   21.0 mmHg MV Decel Time: 201 msec    TR Vmax:        229.00 cm/s MV E velocity: 67.00 cm/s MV A velocity: 92.70 cm/s  SHUNTS MV E/A ratio:  0.72        Systemic VTI:  0.15 m                            Systemic Diam: 1.80 cm Candee Furbish MD Electronically signed by Candee Furbish MD Signature Date/Time: 07/22/2022/12:13:44 PM    Final    DG Chest Port 1 View  Result Date: 07/21/2022 CLINICAL DATA:  Weak EXAM: PORTABLE CHEST 1 VIEW.  Patient is rotated. COMPARISON:  None Available. FINDINGS: Prominent  cardiac silhouette due to AP portable technique. The heart and mediastinal contours are within normal limits. No focal consolidation. Coarsened and prominent markings with no overt pulmonary edema. No pleural effusion. No  pneumothorax. No acute osseous abnormality. IMPRESSION: Findings suggestive of viral bronchiolitis versus reactive airway disease. Electronically Signed   By: Iven Finn M.D.   On: 07/21/2022 20:22     Assessment and Plan:   CHB - her conduction has improved but she still has mobitz 2 and left bundle branch block despite being off of her Beta blocker. I have recommended PPM insertion. The indications/risks/benefits/goals/expectations of PPM insertion were reviewed and she wishes to proceed. HTN - after her PPM we can restart the beta blocker.   Risk Assessment/Risk Scores:       For questions or updates, please contact [place] Please consult [email or web address] for contact info under    Signed, [name], MD  07/23/2022 10:16 AM

## 2022-07-24 DIAGNOSIS — I442 Atrioventricular block, complete: Secondary | ICD-10-CM | POA: Diagnosis not present

## 2022-07-24 LAB — BASIC METABOLIC PANEL
Anion gap: 12 (ref 5–15)
BUN: 27 mg/dL — ABNORMAL HIGH (ref 8–23)
CO2: 20 mmol/L — ABNORMAL LOW (ref 22–32)
Calcium: 8.9 mg/dL (ref 8.9–10.3)
Chloride: 106 mmol/L (ref 98–111)
Creatinine, Ser: 1.06 mg/dL — ABNORMAL HIGH (ref 0.44–1.00)
GFR, Estimated: 52 mL/min — ABNORMAL LOW (ref >60)
Glucose, Bld: 105 mg/dL — ABNORMAL HIGH (ref 70–99)
Potassium: 3.9 mmol/L (ref 3.5–5.1)
Sodium: 138 mmol/L (ref 135–145)

## 2022-07-24 MED ORDER — HEPARIN SODIUM (PORCINE) 5000 UNIT/ML IJ SOLN
5000.0000 [IU] | Freq: Three times a day (TID) | INTRAMUSCULAR | Status: AC
  Administered 2022-07-24 (×2): 5000 [IU] via SUBCUTANEOUS
  Filled 2022-07-24 (×2): qty 1

## 2022-07-24 NOTE — Progress Notes (Signed)
Rounding Note    Patient Name: Marisa Livingston Date of Encounter: 07/24/2022  [facility] Cardiologist: Candee Furbish, MD   Subjective   No chest pain or sob. Anxious about going home and passing out.  Inpatient Medications    Scheduled Meds:  heparin  5,000 Units Subcutaneous Q8H   sacubitril-valsartan  1 tablet Oral BID   simvastatin  10 mg Oral Daily   sodium chloride flush  3 mL Intravenous Q12H   Continuous Infusions:  sodium chloride     PRN Meds: sodium chloride, acetaminophen, ondansetron (ZOFRAN) IV, mouth rinse, sodium chloride flush   Vital Signs    Vitals:   07/23/22 1944 07/24/22 0047 07/24/22 0436 07/24/22 0848  BP: 129/63 (!) 110/58 114/64 109/64  Pulse: 97 98 100 93  Resp:   15 19  Temp: 98.1 F (36.7 C) 98.3 F (36.8 C) 98.3 F (36.8 C) 97.8 F (36.6 C)  TempSrc: Oral Oral Oral Oral  SpO2: 95% 98% 96% 94%  Weight:      Height:        Intake/Output Summary (Last 24 hours) at 07/24/2022 0940 Last data filed at 07/23/2022 1700 Gross per 24 hour  Intake 240 ml  Output --  Net 240 ml      07/23/2022   12:22 AM 07/22/2022    3:11 PM 07/21/2022    7:49 PM  Last 3 Weights  Weight (lbs) 181 lb 14.1 oz 178 lb 12.7 oz 182 lb 6.4 oz  Weight (kg) 82.5 kg 81.1 kg 82.736 kg      Telemetry    Nsr, no additional heart block - Personally Reviewed  ECG    none - Personally Reviewed  Physical Exam   GEN: No acute distress.   Neck: No JVD Cardiac: RRR, no murmurs, rubs, or gallops.  Respiratory: Clear to auscultation bilaterally. GI: Soft, nontender, non-distended  MS: No edema; No deformity. Neuro:  Nonfocal  Psych: Normal affect   Labs    High Sensitivity Troponin:   Recent Labs  Lab 07/21/22 2005 07/21/22 2151  TROPONINIHS 7 15     Chemistry Recent Labs  Lab 07/21/22 2005 07/21/22 2006 07/22/22 0552 07/23/22 0036 07/24/22 0037  NA  --    < > 142 140 138  K  --    < > 4.2 3.8 3.9  CL  --    < > 106 107 106   CO2  --    < > 26 24 20*  GLUCOSE  --    < > 104* 105* 105*  BUN  --    < > 18 17 27*  CREATININE  --    < > 1.02* 1.01* 1.06*  CALCIUM  --    < > 9.6 8.9 8.9  PROT 7.4  --   --   --   --   ALBUMIN 3.8  --   --   --   --   AST 24  --   --   --   --   ALT 18  --   --   --   --   ALKPHOS 54  --   --   --   --   BILITOT 0.3  --   --   --   --   GFRNONAA  --    < > 54* 55* 52*  ANIONGAP  --    < > '10 9 12   '$ < > = values in this interval not displayed.  Lipids No results for input(s): "CHOL", "TRIG", "HDL", "LABVLDL", "LDLCALC", "CHOLHDL" in the last 168 hours.  Hematology Recent Labs  Lab 07/21/22 2006 07/22/22 0552  WBC 8.8 7.9  RBC 4.29 4.28  HGB 12.4 12.4  HCT 38.8 38.9  MCV 90.4 90.9  MCH 28.9 29.0  MCHC 32.0 31.9  RDW 13.9 14.0  PLT 256 272   Thyroid No results for input(s): "TSH", "FREET4" in the last 168 hours.  BNPNo results for input(s): "BNP", "PROBNP" in the last 168 hours.  DDimer No results for input(s): "DDIMER" in the last 168 hours.   Radiology    No results found.  Cardiac Studies   none  Patient Profile     87 y.o. female admitted with CHB, resolved after stopping metoprolol  Assessment & Plan    Stokes Adams syncope - her CHB resolved after cessation of her beta blocker. She has had one episode of Mobitz 2, second degree AV block in the interim and I have offered her to option of PPM as an inpatient or wearing a zio monitor as an outpatient. Over the years she has had at least 3 episodes of sudden syncope with no warning and injury and she prefers to remain in as an inpatient and undergo PPM insertion. I think in light of her baseline LBBB, this is the best option as well. We will proceed with PPM either tomorrow as schedule allows or on Wednesday.  For questions or updates, please contact [place] Please consult [email or web address] for contact info under      Signed, [name], MD  07/24/2022, 9:40 AM

## 2022-07-24 NOTE — H&P (View-Only) (Signed)
Rounding Note    Patient Name: Verl Dicker Date of Encounter: 07/24/2022  [facility] Cardiologist: Candee Furbish, MD   Subjective   No chest pain or sob. Anxious about going home and passing out.  Inpatient Medications    Scheduled Meds:  heparin  5,000 Units Subcutaneous Q8H   sacubitril-valsartan  1 tablet Oral BID   simvastatin  10 mg Oral Daily   sodium chloride flush  3 mL Intravenous Q12H   Continuous Infusions:  sodium chloride     PRN Meds: sodium chloride, acetaminophen, ondansetron (ZOFRAN) IV, mouth rinse, sodium chloride flush   Vital Signs    Vitals:   07/23/22 1944 07/24/22 0047 07/24/22 0436 07/24/22 0848  BP: 129/63 (!) 110/58 114/64 109/64  Pulse: 97 98 100 93  Resp:   15 19  Temp: 98.1 F (36.7 C) 98.3 F (36.8 C) 98.3 F (36.8 C) 97.8 F (36.6 C)  TempSrc: Oral Oral Oral Oral  SpO2: 95% 98% 96% 94%  Weight:      Height:        Intake/Output Summary (Last 24 hours) at 07/24/2022 0940 Last data filed at 07/23/2022 1700 Gross per 24 hour  Intake 240 ml  Output --  Net 240 ml      07/23/2022   12:22 AM 07/22/2022    3:11 PM 07/21/2022    7:49 PM  Last 3 Weights  Weight (lbs) 181 lb 14.1 oz 178 lb 12.7 oz 182 lb 6.4 oz  Weight (kg) 82.5 kg 81.1 kg 82.736 kg      Telemetry    Nsr, no additional heart block - Personally Reviewed  ECG    none - Personally Reviewed  Physical Exam   GEN: No acute distress.   Neck: No JVD Cardiac: RRR, no murmurs, rubs, or gallops.  Respiratory: Clear to auscultation bilaterally. GI: Soft, nontender, non-distended  MS: No edema; No deformity. Neuro:  Nonfocal  Psych: Normal affect   Labs    High Sensitivity Troponin:   Recent Labs  Lab 07/21/22 2005 07/21/22 2151  TROPONINIHS 7 15     Chemistry Recent Labs  Lab 07/21/22 2005 07/21/22 2006 07/22/22 0552 07/23/22 0036 07/24/22 0037  NA  --    < > 142 140 138  K  --    < > 4.2 3.8 3.9  CL  --    < > 106 107 106   CO2  --    < > 26 24 20*  GLUCOSE  --    < > 104* 105* 105*  BUN  --    < > 18 17 27*  CREATININE  --    < > 1.02* 1.01* 1.06*  CALCIUM  --    < > 9.6 8.9 8.9  PROT 7.4  --   --   --   --   ALBUMIN 3.8  --   --   --   --   AST 24  --   --   --   --   ALT 18  --   --   --   --   ALKPHOS 54  --   --   --   --   BILITOT 0.3  --   --   --   --   GFRNONAA  --    < > 54* 55* 52*  ANIONGAP  --    < > '10 9 12   '$ < > = values in this interval not displayed.  Lipids No results for input(s): "CHOL", "TRIG", "HDL", "LABVLDL", "LDLCALC", "CHOLHDL" in the last 168 hours.  Hematology Recent Labs  Lab 07/21/22 2006 07/22/22 0552  WBC 8.8 7.9  RBC 4.29 4.28  HGB 12.4 12.4  HCT 38.8 38.9  MCV 90.4 90.9  MCH 28.9 29.0  MCHC 32.0 31.9  RDW 13.9 14.0  PLT 256 272   Thyroid No results for input(s): "TSH", "FREET4" in the last 168 hours.  BNPNo results for input(s): "BNP", "PROBNP" in the last 168 hours.  DDimer No results for input(s): "DDIMER" in the last 168 hours.   Radiology    No results found.  Cardiac Studies   none  Patient Profile     87 y.o. female admitted with CHB, resolved after stopping metoprolol  Assessment & Plan    Stokes Adams syncope - her CHB resolved after cessation of her beta blocker. She has had one episode of Mobitz 2, second degree AV block in the interim and I have offered her to option of PPM as an inpatient or wearing a zio monitor as an outpatient. Over the years she has had at least 3 episodes of sudden syncope with no warning and injury and she prefers to remain in as an inpatient and undergo PPM insertion. I think in light of her baseline LBBB, this is the best option as well. We will proceed with PPM either tomorrow as schedule allows or on Wednesday.  For questions or updates, please contact [place] Please consult [email or web address] for contact info under      Signed, [name], MD  07/24/2022, 9:40 AM

## 2022-07-25 DIAGNOSIS — I442 Atrioventricular block, complete: Secondary | ICD-10-CM | POA: Diagnosis not present

## 2022-07-25 LAB — BASIC METABOLIC PANEL
Anion gap: 9 (ref 5–15)
BUN: 28 mg/dL — ABNORMAL HIGH (ref 8–23)
CO2: 22 mmol/L (ref 22–32)
Calcium: 8.7 mg/dL — ABNORMAL LOW (ref 8.9–10.3)
Chloride: 105 mmol/L (ref 98–111)
Creatinine, Ser: 1.05 mg/dL — ABNORMAL HIGH (ref 0.44–1.00)
GFR, Estimated: 52 mL/min — ABNORMAL LOW (ref >60)
Glucose, Bld: 110 mg/dL — ABNORMAL HIGH (ref 70–99)
Potassium: 3.8 mmol/L (ref 3.5–5.1)
Sodium: 136 mmol/L (ref 135–145)

## 2022-07-25 MED ORDER — SODIUM CHLORIDE 0.9 % IV SOLN: INTRAVENOUS | Status: DC

## 2022-07-25 MED ORDER — CHLORHEXIDINE GLUCONATE 4 % EX LIQD
60.0000 mL | Freq: Once | CUTANEOUS | Status: AC
  Administered 2022-07-26: 4 via TOPICAL
  Filled 2022-07-25: qty 60

## 2022-07-25 MED ORDER — CEFAZOLIN SODIUM-DEXTROSE 2-4 GM/100ML-% IV SOLN
2.0000 g | INTRAVENOUS | Status: AC
  Administered 2022-07-26: 2 g via INTRAVENOUS

## 2022-07-25 MED ORDER — SODIUM CHLORIDE 0.9 % IV SOLN
80.0000 mg | INTRAVENOUS | Status: AC
  Administered 2022-07-26: 80 mg
  Filled 2022-07-25: qty 2

## 2022-07-25 MED ORDER — CHLORHEXIDINE GLUCONATE 4 % EX LIQD
60.0000 mL | Freq: Once | CUTANEOUS | Status: AC
  Administered 2022-07-25: 4 via TOPICAL
  Filled 2022-07-25: qty 60

## 2022-07-25 NOTE — Care Management Important Message (Signed)
Important Message  Patient Details  Name: Marisa Livingston MRN: [id number] Date of Birth: 1936-04-25   Medicare Important Message Given:  Yes     Shelda Altes 07/25/2022, 10:54 AM

## 2022-07-25 NOTE — TOC Progression Note (Signed)
Transition of Care [facility]) - Progression Note    Patient Details  Name: MANEH SIEBEN MRN: [id number] Date of Birth: Jan 23, 1936  [facility]) CM/SW Contact  Zenon Mayo, RN Phone Number: 07/25/2022, 4:28 PM  Clinical Narrative:    from home, indep with walker, CHB, syncopal episode, plan for pacemaker  tomorrow. TOC following.        Expected Discharge Plan and Services                                               Social Determinants of Health (SDOH) Interventions SDOH Screenings   Depression (PHQ2-9): Low Risk  (12/23/2020)  Tobacco Use: Low Risk  (07/21/2022)    Readmission Risk Interventions     No data to display

## 2022-07-25 NOTE — Progress Notes (Signed)
Mobility Specialist Progress Note    07/25/22 1100  Mobility  Activity Ambulated with assistance in hallway  Level of Assistance Contact guard assist, steadying assist  Assistive Device Front wheel walker  Distance Ambulated (ft) 80 ft  Activity Response Tolerated well  Mobility Referral Yes  $Mobility charge 1 Mobility    Pre-Mobility: 104 HR During Mobility: 121 HR Post-Mobility: 105 HR  Pt was in chair and agreeable. Had no c/o pain throughout ambulation. Returned to chair w/ all needs met and call bell in reach   Page Specialist  Please contact via Solicitor or Rehab office at [phone]

## 2022-07-25 NOTE — Progress Notes (Signed)
Due to census pt is being deferred to tomorrow am. Pt, famil, and cath lab aware.   Legrand Como [address]" [place], [place]  07/25/2022 3:56 PM

## 2022-07-25 NOTE — Progress Notes (Addendum)
Electrophysiology Rounding Note  Patient Name: Marisa Livingston Date of Encounter: 07/25/2022  Primary Cardiologist: Candee Furbish, MD Electrophysiologist: Dr. Lovena Le   Subjective   NAEO. No further bradycardia. Now faster off of BB.  Inpatient Medications    Scheduled Meds:  sacubitril-valsartan  1 tablet Oral BID   simvastatin  10 mg Oral Daily   sodium chloride flush  3 mL Intravenous Q12H   Continuous Infusions:  sodium chloride     PRN Meds: sodium chloride, acetaminophen, ondansetron (ZOFRAN) IV, mouth rinse, sodium chloride flush   Vital Signs    Vitals:   07/24/22 1944 07/25/22 0027 07/25/22 0528 07/25/22 0740  BP: (!) 131/52 (!) 108/59 (!) 106/56 120/76  Pulse: 93 94 94 (!) 106  Resp: '17 18 19 16  '$ Temp: 98.2 F (36.8 C) 98.8 F (37.1 C) 98.2 F (36.8 C) (!) 97.4 F (36.3 C)  TempSrc: Oral Oral Oral Oral  SpO2: 96% 95% 97% 99%  Weight:   82.1 kg   Height:        Intake/Output Summary (Last 24 hours) at 07/25/2022 0829 Last data filed at 07/24/2022 1800 Gross per 24 hour  Intake 480 ml  Output --  Net 480 ml   Filed Weights   07/22/22 1511 07/23/22 0022 07/25/22 0528  Weight: 81.1 kg 82.5 kg 82.1 kg    Physical Exam    GEN- The patient is well appearing, alert and oriented x 3 today.   HEENT- No gross abnormality.  Lungs- Clear to ausculation bilaterally, normal work of breathing Heart-  Tachy but regular  rate and rhythm, no murmurs, rubs or gallops GI- soft, NT, ND, + BS Extremities- no clubbing or cyanosis. No edema Neuro- No obvious focal abnormality.   Labs    CBC No results for input(s): "WBC", "NEUTROABS", "HGB", "HCT", "MCV", "PLT" in the last 72 hours. Basic Metabolic Panel Recent Labs    07/24/22 0037 07/25/22 0111  NA 138 136  K 3.9 3.8  CL 106 105  CO2 20* 22  GLUCOSE 105* 110*  BUN 27* 28*  CREATININE 1.06* 1.05*  CALCIUM 8.9 8.7*   Liver Function Tests No results for input(s): "AST", "ALT", "ALKPHOS", "BILITOT",  "PROT", "ALBUMIN" in the last 72 hours. No results for input(s): "LIPASE", "AMYLASE" in the last 72 hours. Cardiac Enzymes No results for input(s): "CKTOTAL", "CKMB", "CKMBINDEX", "TROPONINI" in the last 72 hours.   Telemetry    NSR/Sinus tachycardia 90-110s (personally reviewed)  Radiology    No results found.  Patient Profile     Marisa Livingston is a 87 y.o. female with a past medical history significant for CHB and LBBB.  she was admitted for CHB which resolved after cessation of her BB.   Assessment & Plan    Stokes-Adams Syncope CHB resolved after cessation of her BB, but she did have at least 1 episode of Mobitz 2, second degree AV block Dr. Lovena Le offered zio vs PPM, and with multiple syncopal episodes over the past several years, felt to meet indication for pacing, especially given her baseline LBBB.  Explained risks, benefits, and alternatives to PPM implantation, including but not limited to bleeding, infection, pneumothorax, pericardial effusion, lead dislodgement, heart attack, stroke, or death.  Pt verbalized understanding and agrees to proceed pending availability.   Dr. Curt Bears has been seen today. She is aware that she may be deferred if more urgent cases arise.     For questions or updates, please contact [place] Please consult [email or web address] for  contact info under Cardiology/STEMI.  Signed, Shirley Friar, PA-C  07/25/2022, 8:29 AM   I have seen and examined this patient with Oda Kilts.  Agree with above, note added to reflect my findings.  Patient conducted has improved.  She is now conducting one-to-one and is tachycardic.  No acute complaints.  GEN: Well nourished, well developed, in no acute distress  HEENT: normal  Neck: no JVD, carotid bruits, or masses Cardiac: RRR; no murmurs, rubs, or gallops,no edema  Respiratory:  clear to auscultation bilaterally, normal work of breathing GI: soft, nontender, nondistended, + BS MS: no deformity  or atrophy  Skin: warm and dry Neuro:  Strength and sensation are intact Psych: euthymic mood, full affect   Stokes-Adams syncope: At this point, she Jacalynn Buzzell need pacemaker.  Has had multiple syncopal episodes.  Pending timing, we Jadore Mcguffin try an implant today.  Risk and benefits have been discussed.  Risks include bleeding, tamponade, infection, pneumothorax, lead dislodgment.  She understands these risks and is agreed to the procedure.  Jolette Lana M. Olly Shiner MD 07/25/2022 2:20 PM

## 2022-07-26 ENCOUNTER — Inpatient Hospital Stay (HOSPITAL_COMMUNITY): Payer: PPO

## 2022-07-26 ENCOUNTER — Encounter (HOSPITAL_COMMUNITY): Payer: Self-pay | Admitting: Internal Medicine

## 2022-07-26 ENCOUNTER — Encounter (HOSPITAL_COMMUNITY)
Admission: EM | Disposition: A | Discharge status: Home / Self Care | Payer: Self-pay | Source: Home / Self Care | Attending: Internal Medicine

## 2022-07-26 ENCOUNTER — Other Ambulatory Visit: Payer: Self-pay

## 2022-07-26 DIAGNOSIS — I442 Atrioventricular block, complete: Secondary | ICD-10-CM | POA: Diagnosis not present

## 2022-07-26 HISTORY — PX: PACEMAKER IMPLANT: EP1218

## 2022-07-26 LAB — BASIC METABOLIC PANEL
Anion gap: 8 (ref 5–15)
BUN: 24 mg/dL — ABNORMAL HIGH (ref 8–23)
CO2: 25 mmol/L (ref 22–32)
Calcium: 8.9 mg/dL (ref 8.9–10.3)
Chloride: 104 mmol/L (ref 98–111)
Creatinine, Ser: 1 mg/dL (ref 0.44–1.00)
GFR, Estimated: 56 mL/min — ABNORMAL LOW (ref >60)
Glucose, Bld: 116 mg/dL — ABNORMAL HIGH (ref 70–99)
Potassium: 4 mmol/L (ref 3.5–5.1)
Sodium: 137 mmol/L (ref 135–145)

## 2022-07-26 LAB — SURGICAL PCR SCREEN
MRSA, PCR: NEGATIVE
Staphylococcus aureus: NEGATIVE

## 2022-07-26 SURGERY — PACEMAKER IMPLANT

## 2022-07-26 MED ORDER — CEFAZOLIN SODIUM-DEXTROSE 2-4 GM/100ML-% IV SOLN
INTRAVENOUS | Status: AC
  Filled 2022-07-26: qty 100

## 2022-07-26 MED ORDER — MIDAZOLAM HCL 5 MG/5ML IJ SOLN
INTRAMUSCULAR | Status: DC | PRN
  Administered 2022-07-26 (×2): 1 mg via INTRAVENOUS

## 2022-07-26 MED ORDER — CEFAZOLIN SODIUM-DEXTROSE 1-4 GM/50ML-% IV SOLN
1.0000 g | Freq: Once | INTRAVENOUS | Status: AC
  Administered 2022-07-26: 1 g via INTRAVENOUS
  Filled 2022-07-26: qty 50

## 2022-07-26 MED ORDER — SODIUM CHLORIDE 0.9 % IV SOLN
INTRAVENOUS | Status: AC
  Filled 2022-07-26: qty 2

## 2022-07-26 MED ORDER — ACETAMINOPHEN 325 MG PO TABS: 325.0000 mg | ORAL_TABLET | ORAL | Status: DC | PRN

## 2022-07-26 MED ORDER — FENTANYL CITRATE (PF) 100 MCG/2ML IJ SOLN
INTRAMUSCULAR | Status: AC
  Filled 2022-07-26: qty 2

## 2022-07-26 MED ORDER — LIDOCAINE HCL (PF) 1 % IJ SOLN
INTRAMUSCULAR | Status: AC
  Filled 2022-07-26: qty 60

## 2022-07-26 MED ORDER — ENTRESTO 24-26 MG PO TABS: 1.0000 | ORAL_TABLET | Freq: Two times a day (BID) | ORAL | Status: DC

## 2022-07-26 MED ORDER — MIDAZOLAM HCL 5 MG/5ML IJ SOLN
INTRAMUSCULAR | Status: AC
  Filled 2022-07-26: qty 5

## 2022-07-26 MED ORDER — HEPARIN (PORCINE) IN NACL 1000-0.9 UT/500ML-% IV SOLN
INTRAVENOUS | Status: AC
  Filled 2022-07-26: qty 500

## 2022-07-26 MED ORDER — LIDOCAINE HCL (PF) 1 % IJ SOLN
INTRAMUSCULAR | Status: DC | PRN
  Administered 2022-07-26: 50 mL

## 2022-07-26 MED ORDER — SODIUM CHLORIDE 0.9 % IV SOLN
INTRAVENOUS | Status: AC | PRN
  Administered 2022-07-26: 250 mL via INTRAVENOUS

## 2022-07-26 MED ORDER — FENTANYL CITRATE (PF) 100 MCG/2ML IJ SOLN
INTRAMUSCULAR | Status: DC | PRN
  Administered 2022-07-26 (×2): 12.5 ug via INTRAVENOUS

## 2022-07-26 MED ORDER — ONDANSETRON HCL 4 MG/2ML IJ SOLN: 4.0000 mg | Freq: Four times a day (QID) | INTRAMUSCULAR | Status: DC | PRN

## 2022-07-26 MED ORDER — HEPARIN (PORCINE) IN NACL 1000-0.9 UT/500ML-% IV SOLN
INTRAVENOUS | Status: DC | PRN
  Administered 2022-07-26: 500 mL

## 2022-07-26 SURGICAL SUPPLY — 13 items
CABLE SURGICAL S-101-97-12 (CABLE) ×2 IMPLANT
CATH RIGHTSITE C315HIS02 (CATHETERS) IMPLANT
IPG PACE AZUR XT DR MRI W1DR01 (Pacemaker) IMPLANT
LEAD CAPSURE NOVUS 5076-52CM (Lead) IMPLANT
LEAD SELECT SECURE 3830 383069 (Lead) IMPLANT
MAT PREVALON FULL STRYKER (MISCELLANEOUS) IMPLANT
PACE AZURE XT DR MRI W1DR01 (Pacemaker) ×1 IMPLANT
PAD DEFIB RADIO PHYSIO CONN (PAD) ×2 IMPLANT
SELECT SECURE 3830 383069 (Lead) ×1 IMPLANT
SHEATH 7FR PRELUDE SNAP 13 (SHEATH) IMPLANT
SLITTER 6232ADJ (MISCELLANEOUS) IMPLANT
TRAY PACEMAKER INSERTION (PACKS) ×2 IMPLANT
WIRE HI TORQ VERSACORE-J 145CM (WIRE) IMPLANT

## 2022-07-26 NOTE — TOC Transition Note (Signed)
Transition of Care [facility]) - CM/SW Discharge Note   Patient Details  Name: Marisa Livingston MRN: [id number] Date of Birth: [birth year]  Transition of Care [facility]) CM/SW Contact:  Zenon Mayo, RN Phone Number: 07/26/2022, 9:59 AM   Clinical Narrative:    Patient is for dc today, she has no needs.          Patient Goals and CMS Choice      Discharge Placement                         Discharge Plan and Services Additional resources added to the After Visit Summary for                                       Social Determinants of Health (SDOH) Interventions SDOH Screenings   Depression (PHQ2-9): Low Risk  (12/23/2020)  Tobacco Use: Low Risk  (07/21/2022)     Readmission Risk Interventions     No data to display

## 2022-07-26 NOTE — Consult Note (Signed)
[facility] CM Inpatient Consult   07/26/2022  Marisa Livingston 10/27/1935 [id number]  [facility] [[facility]]  Accountable Care Organization [ACO] Patient: [name]  Primary Care Provider:  Hall, John Z, MD   Patient screened for hospitalization with noted to assess for potential [facility]  [[facility]] Care Management service needs for post hospital transition for care coordination.  Review of patient's electronic medical record reveals patient is s/p pacemaker and for home.  Met with patient and son at bedside to explain for post hospital follow up calls to check her status.  Patient did not know of any current needs. Explain that any issues she could discuss with nurse. Patient verbalized understanding. SDOH reviewed, no needs.  Plan:  Referral request for community care coordination: none needed at this time for [facility] Care Coordination.  Of note, [facility] Care Management/Population Health does not replace or interfere with any arrangements made by the Inpatient Transition of Care team.  For questions contact:   Victoria Brewer, RN BSN CCM [facility] Hospital Liaison Population Health  [phone] business mobile phone Toll free office [phone]  *Concierge Line  [phone] Fax number: [phone] [email or web address]       

## 2022-07-26 NOTE — Discharge Summary (Addendum)
ELECTROPHYSIOLOGY PROCEDURE DISCHARGE SUMMARY    Patient ID: Marisa Livingston,  MRN: [id number], DOB/AGE: 85-Oct-1937 85 y.o.  Admit date: 07/21/2022 Discharge date: 07/26/2022  Primary Care Physician: Celene Squibb, MD  Primary Cardiologist: Candee Furbish, MD  Electrophysiologist: Dr. Lovena Le   Primary Discharge Diagnosis:  Intermittent complete heart block status post pacemaker implantation this admission  Secondary Discharge Diagnosis:  Stokes Adams Syncope  Allergies  Allergen Reactions   Codeine Nausea Only   Levaquin [Levofloxacin In D5w]     Sick      Procedures This Admission:  1.  Implantation of a Medtronic Dual Chamber PPM on 07/26/2022 by Dr. Lovena Le. The patient received a Medtronic Azure XT DR [id number]  with a Medtronic CapSureFix Novus 5076 right atrial lead and a Medtronic SelectSure 4825 right ventricular lead.  There were no immediate post procedure complications.   2.  CXR on 07/26/2022 demonstrated no pneumothorax status post device implantation.       Brief HPI: Marisa Livingston is a 87 y.o. female was admitted for syncope and found to be in CHB. Electrophysiology team asked to see for consideration of PPM implantation.  Past medical history includes above.  The patient has had AV block without reversible causes identified. She did have improvement after holding her AV nodal agent, but continued to have intermittent second degree AV block, as well as an episode of CHB prior to pacing.  Risks, benefits, and alternatives to PPM implantation were reviewed with the patient who wished to proceed.   Hospital Course:  The patient was admitted and underwent implantation of a Medtronic dual chamber PPM with details as outlined above.  She was monitored on telemetry overnight which demonstrated NSR.  Left chest was without hematoma or ecchymosis.  The device was interrogated and found to be functioning normally.  CXR was obtained and demonstrated no pneumothorax status post  device implantation.  Wound care, arm mobility, and restrictions were reviewed with the patient.  The patient was examined and considered stable for discharge to home.    Metoprolol was resumed on discharge now that she is s/p pacing.   Anticoagulation resumption This patient is not on anticoagulation     Physical Exam: Vitals:   07/26/22 1015 07/26/22 1030 07/26/22 1100 07/26/22 1146  BP: 116/63 (!) 107/42 100/65 (!) 143/84  Pulse: 100 80 99 (!) 106  Resp: '19 19 19 14  '$ Temp:    98 F (36.7 C)  TempSrc:    Oral  SpO2: 95% 94% 95%   Weight:      Height:        GEN- The patient is well appearing, alert and oriented x 3 today.   HEENT: normocephalic, atraumatic; sclera clear, conjunctiva pink; hearing intact; oropharynx clear; neck supple, no JVP Lymph- no cervical lymphadenopathy Lungs- Clear to ausculation bilaterally, normal work of breathing.  No wheezes, rales, rhonchi Heart- Regular rate and rhythm, no murmurs, rubs or gallops, PMI not laterally displaced GI- soft, non-tender, non-distended, bowel sounds present, no hepatosplenomegaly Extremities- no clubbing, cyanosis, or edema; DP/PT/radial pulses 2+ bilaterally MS- no significant deformity or atrophy Skin- warm and dry, no rash or lesion, left chest without hematoma/ecchymosis Psych- euthymic mood, full affect Neuro- strength and sensation are intact   Labs:   Lab Results  Component Value Date   WBC 7.9 07/22/2022   HGB 12.4 07/22/2022   HCT 38.9 07/22/2022   MCV 90.9 07/22/2022   PLT 272 07/22/2022    Recent Labs  Lab 07/21/22 2005 07/21/22 2006 07/26/22 0031  NA  --    < > 137  K  --    < > 4.0  CL  --    < > 104  CO2  --    < > 25  BUN  --    < > 24*  CREATININE  --    < > 1.00  CALCIUM  --    < > 8.9  PROT 7.4  --   --   BILITOT 0.3  --   --   ALKPHOS 54  --   --   ALT 18  --   --   AST 24  --   --   GLUCOSE  --    < > 116*   < > = values in this interval not displayed.    Discharge  Medications:  Allergies as of 07/26/2022       Reactions   Codeine Nausea Only   Levaquin [levofloxacin In D5w]    Sick         Medication List     STOP taking these medications    metoprolol succinate 50 MG 24 hr tablet Commonly known as: TOPROL-XL       TAKE these medications    acetaminophen 325 MG tablet Commonly known as: TYLENOL Take 325-650 mg by mouth every 6 (six) hours as needed for mild pain, fever or headache.   CITRACAL PO Take 1 tablet by mouth daily.   Entresto 24-26 MG Generic drug: sacubitril-valsartan Take 1 tablet by mouth 2 (two) times daily. Start taking on: July 27, 2022   multivitamin with minerals tablet Take 1 tablet by mouth daily.   simvastatin 10 MG tablet Commonly known as: ZOCOR Take 10 mg by mouth daily.        Disposition:    Follow-up Information     [place] [place] Follow up.   Why: [address]. On 1/17 at 0840 am for post pacemaker check Contact information: [place] [address] [phone]                Duration of Discharge Encounter: Greater than 30 minutes including physician time.  Jacalyn Lefevre, PA-C  07/26/2022 3:28 PM   EP Attending  Patient seen and examined. Agree with above. The patient is doing well after PPM insertion. Her PM interrogation and cxr look good. She will be discharged home with usual followup.   Carleene Overlie Josemaria Brining,MD

## 2022-07-26 NOTE — Progress Notes (Signed)
RN went over discharge instructions and medication changes with patient and patient's son at bedside. Patient acknowledge understanding of medication changes and follow up appointments. RN went over post pacemaker instructions with patient and family. Patient verbalize understanding. Currently getting dressed and transportation is at the bedside.

## 2022-07-26 NOTE — Interval H&P Note (Signed)
History and Physical Interval Note:  07/26/2022 7:06 AM  Marisa Livingston  has presented today for surgery, with the diagnosis of syncope - heart block.  The various methods of treatment have been discussed with the patient and family. After consideration of risks, benefits and other options for treatment, the patient has consented to  Procedure(s): PACEMAKER IMPLANT (N/A) as a surgical intervention.  The patient's history has been reviewed, patient examined, no change in status, stable for surgery.  I have reviewed the patient's chart and labs.  Questions were answered to the patient's satisfaction.   Patient seen and examined. Since my last visit,no change. We will plan to proceed with PPM insertion due to [employer] attacks,LBBB, with persistent mobitz 2, second degree AV block after stopping metoprolol.  Marisa Livingston

## 2022-07-31 ENCOUNTER — Telehealth: Payer: Self-pay | Admitting: Cardiology

## 2022-07-31 DIAGNOSIS — R600 Localized edema: Secondary | ICD-10-CM | POA: Diagnosis not present

## 2022-07-31 NOTE — Telephone Encounter (Signed)
Spoke with son, Elenore Rota ([place] per [facility]) who is reporting pt saw her PCP this AM requesting her left wrist discomfort and "minor swelling" in her feet.  PCP advised edema in feet is r/t not being up and active since her pacer placement.  Advised use of knee high compression stockings.  Pt had pacer placement 07/26/22 d/t CHB.  PCP advised, after evaluation, wrist pain and discomfort is from immobilization of her arm which has been in a sling d/t pacer placement.  Son will continue to monitor pt's s/s and call back if any increase or change in pt's s/s.

## 2022-07-31 NOTE — Telephone Encounter (Signed)
Pt c/o swelling: STAT is pt has developed SOB within 24 hours  If swelling, where is the swelling located? Left wrist and ankles  How much weight have you gained and in what time span? No  Have you gained 3 pounds in a day or 5 pounds in a week? No  Do you have a log of your daily weights (if so, list)? No  Are you currently taking a fluid pill? No  Are you currently SOB? No  Have you traveled recently? No  Pt's son would like a callback regarding this matter

## 2022-08-09 ENCOUNTER — Ambulatory Visit: Payer: PPO | Attending: Cardiovascular Disease

## 2022-08-09 DIAGNOSIS — I429 Cardiomyopathy, unspecified: Secondary | ICD-10-CM

## 2022-08-09 LAB — CUP PACEART INCLINIC DEVICE CHECK
Battery Remaining Longevity: 175 mo
Battery Voltage: 3.22 V
Brady Statistic AP VP Percent: 1.65 %
Brady Statistic AP VS Percent: 0.47 %
Brady Statistic AS VP Percent: 14.72 %
Brady Statistic AS VS Percent: 83.16 %
Brady Statistic RA Percent Paced: 2.14 %
Brady Statistic RV Percent Paced: 16.37 %
Date Time Interrogation Session: 20240117194449
Implantable Lead Connection Status: 753985
Implantable Lead Connection Status: 753985
Implantable Lead Implant Date: 20240103
Implantable Lead Implant Date: 20240103
Implantable Lead Location: 753857
Implantable Lead Location: 753859
Implantable Lead Model: 3830
Implantable Lead Model: 5076
Implantable Pulse Generator Implant Date: 20240103
Lead Channel Impedance Value: 323 Ohm
Lead Channel Impedance Value: 380 Ohm
Lead Channel Impedance Value: 380 Ohm
Lead Channel Impedance Value: 513 Ohm
Lead Channel Pacing Threshold Amplitude: 0.625 V
Lead Channel Pacing Threshold Amplitude: 0.75 V
Lead Channel Pacing Threshold Amplitude: 0.875 V
Lead Channel Pacing Threshold Amplitude: 1 V
Lead Channel Pacing Threshold Pulse Width: 0.4 ms
Lead Channel Pacing Threshold Pulse Width: 0.4 ms
Lead Channel Pacing Threshold Pulse Width: 0.4 ms
Lead Channel Pacing Threshold Pulse Width: 0.4 ms
Lead Channel Sensing Intrinsic Amplitude: 1.5 mV
Lead Channel Sensing Intrinsic Amplitude: 2.375 mV
Lead Channel Sensing Intrinsic Amplitude: 7.875 mV
Lead Channel Sensing Intrinsic Amplitude: 8.5 mV
Lead Channel Setting Pacing Amplitude: 3.5 V
Lead Channel Setting Pacing Amplitude: 3.5 V
Lead Channel Setting Pacing Pulse Width: 0.4 ms
Lead Channel Setting Sensing Sensitivity: 1.2 mV
Zone Setting Status: 755011

## 2022-08-09 NOTE — Progress Notes (Signed)
Wound check appointment. Steri-strips removed. Wound without redness or edema. Incision edges approximated, wound well healed. Normal device function. Thresholds, sensing, and impedances consistent with implant measurements. Device programmed at 3.5V/auto capture programmed on for extra safety margin until 3 month visit. Histogram distribution appropriate for patient and level of activity. AT/AF burden <.1%,5 secs of AT with 1:1 conduction. Patient educated about wound care, arm mobility, lifting restrictions. ROV in 3 months with implanting physician.

## 2022-08-09 NOTE — Patient Instructions (Signed)
   After Your Pacemaker   Monitor your pacemaker site for redness, swelling, and drainage. Call the device clinic at [phone] if you experience these symptoms or fever/chills.  Your incision was closed with Steri-strips or staples:  You may shower 7 days after your procedure and wash your incision with soap and water. Avoid lotions, ointments, or perfumes over your incision until it is well-healed.  You may use a hot tub or a pool after your wound check appointment if the incision is completely closed.  Do not lift, push or pull greater than 10 pounds with the affected arm until 6 weeks after your procedure. There are no other restrictions in arm movement after your wound check appointment. Until AFTER February 14th.   You may drive, unless driving has been restricted by your healthcare providers.   Remote monitoring is used to monitor your pacemaker from home. This monitoring is scheduled every 91 days by our office. It allows [place] to keep an eye on the functioning of your device to ensure it is working properly. You will routinely see your Electrophysiologist annually (more often if necessary).

## 2022-08-14 ENCOUNTER — Other Ambulatory Visit (HOSPITAL_COMMUNITY): Payer: Self-pay | Admitting: Family Medicine

## 2022-08-14 ENCOUNTER — Ambulatory Visit (HOSPITAL_COMMUNITY)
Admission: RE | Admit: 2022-08-14 | Discharge: 2022-08-14 | Disposition: A | Discharge status: Home / Self Care | Payer: PPO | Source: Ambulatory Visit | Attending: Family Medicine | Admitting: Family Medicine

## 2022-08-14 ENCOUNTER — Encounter (HOSPITAL_COMMUNITY): Payer: Self-pay | Admitting: Family Medicine

## 2022-08-14 DIAGNOSIS — M189 Osteoarthritis of first carpometacarpal joint, unspecified: Secondary | ICD-10-CM | POA: Diagnosis not present

## 2022-08-14 DIAGNOSIS — R600 Localized edema: Secondary | ICD-10-CM

## 2022-08-14 DIAGNOSIS — M7989 Other specified soft tissue disorders: Secondary | ICD-10-CM | POA: Diagnosis not present

## 2022-08-14 DIAGNOSIS — M19032 Primary osteoarthritis, left wrist: Secondary | ICD-10-CM | POA: Diagnosis not present

## 2022-08-17 ENCOUNTER — Telehealth: Payer: Self-pay

## 2022-08-17 NOTE — Telephone Encounter (Signed)
Discussed left wrist/hand/finger discomfort. Area is still sore and swollen. Saw her PCP who evaluated and ordered Xrays.  Xrays negative but did show some soft tissue swelling and OA.  This area is likely aggravated from positioning during surgery and sling immobilization following PPM placement.   She denies any redness, heat or pain to hand, arm or shoulder. PCP recommended patient use Tylenol Arthritis.   Patient does report increase BLE edema.  She is not wearing her compression hose. Denies any SOB.   Patient is to call Dr. Marlou Porch office and report symptoms of edema for further follow up. I will review hand/wrist discomfort with Dr. Lovena Le to ensure nothing further to consider on our end and to continue with Tylenol Arthritis and give wrist/hand more time to heal.

## 2022-08-17 NOTE — Telephone Encounter (Signed)
The patient left a voicemail stating she was seen in clinic on 08/09/2022. Her hands had some swelling. The nurse told her to give [place] a call if the swelling did not go away. She states she had an xray done on her hand and nothing is broken or fractured. Her phone number is [phone].

## 2022-08-18 ENCOUNTER — Encounter (HOSPITAL_COMMUNITY): Payer: Self-pay | Admitting: Family Medicine

## 2022-08-18 ENCOUNTER — Other Ambulatory Visit (HOSPITAL_COMMUNITY): Payer: Self-pay | Admitting: Family Medicine

## 2022-08-18 DIAGNOSIS — R600 Localized edema: Secondary | ICD-10-CM

## 2022-08-21 ENCOUNTER — Telehealth: Payer: Self-pay | Admitting: *Deleted

## 2022-08-21 NOTE — Telephone Encounter (Signed)
Pt spoke with Estill Bamberg last week regarding her left hand/wrist and finger swelling she has had since her pacer placement.  Pt states it was her understanding, Estill Bamberg was going to review with Dr Lovena Le and call her back but she hasn't heard anything.  Pt reports the swelling in her fingers is better but remains at her hand and wrist.  She denies any soreness/pain in the area.  Had seen PCP who advised to take Tylenol Arthritis.  Pt has only been taking one tablet a day because she is concerned about taking too much medication and possible interactions.  Advised [place] to take Tylenol as instructed.  Aware I will send this information to Estill Bamberg to determine if she reviewed with Dr Lovena Le and if there are any further instructions r/t her hand edema.  Also, pt reports any and all edema she had at her feet/ankles has resolved.

## 2022-08-21 NOTE — Telephone Encounter (Signed)
Pt is calling back stating she went to primary care and had a xray done and there is no fractures. Pt states her wrist is still swollen and would like a call back as to what to do

## 2022-08-22 NOTE — Telephone Encounter (Signed)
Reviewed with Dr. Lovena Le. He agrees with plan below and nothing further except to keep it elevated and know that it may take some time for it to fully absorb.   Patient is aware and she knows to call the office, IF it gets worse, but otherwise needs to give it time.

## 2022-08-22 NOTE — Telephone Encounter (Signed)
See other phone note regarding hand/wrist swelling.

## 2022-08-28 ENCOUNTER — Observation Stay (HOSPITAL_COMMUNITY)
Admission: EM | Admit: 2022-08-28 | Discharge: 2022-08-29 | Disposition: A | Discharge status: Home / Self Care | Payer: PPO | Attending: Family Medicine | Admitting: Family Medicine

## 2022-08-28 ENCOUNTER — Other Ambulatory Visit: Payer: Self-pay

## 2022-08-28 ENCOUNTER — Emergency Department (HOSPITAL_COMMUNITY): Payer: PPO

## 2022-08-28 ENCOUNTER — Encounter (HOSPITAL_COMMUNITY): Payer: Self-pay

## 2022-08-28 DIAGNOSIS — R519 Headache, unspecified: Secondary | ICD-10-CM

## 2022-08-28 DIAGNOSIS — I442 Atrioventricular block, complete: Secondary | ICD-10-CM | POA: Diagnosis not present

## 2022-08-28 DIAGNOSIS — Z950 Presence of cardiac pacemaker: Secondary | ICD-10-CM | POA: Diagnosis not present

## 2022-08-28 DIAGNOSIS — R2681 Unsteadiness on feet: Secondary | ICD-10-CM | POA: Insufficient documentation

## 2022-08-28 DIAGNOSIS — R7989 Other specified abnormal findings of blood chemistry: Secondary | ICD-10-CM

## 2022-08-28 DIAGNOSIS — G4489 Other headache syndrome: Secondary | ICD-10-CM | POA: Diagnosis not present

## 2022-08-28 DIAGNOSIS — I672 Cerebral atherosclerosis: Secondary | ICD-10-CM | POA: Diagnosis not present

## 2022-08-28 DIAGNOSIS — I771 Stricture of artery: Secondary | ICD-10-CM | POA: Diagnosis not present

## 2022-08-28 DIAGNOSIS — R2689 Other abnormalities of gait and mobility: Secondary | ICD-10-CM | POA: Diagnosis not present

## 2022-08-28 DIAGNOSIS — G459 Transient cerebral ischemic attack, unspecified: Principal | ICD-10-CM

## 2022-08-28 DIAGNOSIS — R531 Weakness: Secondary | ICD-10-CM | POA: Diagnosis not present

## 2022-08-28 DIAGNOSIS — M6281 Muscle weakness (generalized): Secondary | ICD-10-CM | POA: Insufficient documentation

## 2022-08-28 DIAGNOSIS — I639 Cerebral infarction, unspecified: Secondary | ICD-10-CM

## 2022-08-28 DIAGNOSIS — I10 Essential (primary) hypertension: Secondary | ICD-10-CM | POA: Diagnosis not present

## 2022-08-28 DIAGNOSIS — R778 Other specified abnormalities of plasma proteins: Secondary | ICD-10-CM | POA: Diagnosis not present

## 2022-08-28 DIAGNOSIS — I5022 Chronic systolic (congestive) heart failure: Secondary | ICD-10-CM | POA: Diagnosis not present

## 2022-08-28 DIAGNOSIS — R479 Unspecified speech disturbances: Secondary | ICD-10-CM | POA: Diagnosis not present

## 2022-08-28 DIAGNOSIS — R000 Tachycardia, unspecified: Secondary | ICD-10-CM | POA: Diagnosis not present

## 2022-08-28 DIAGNOSIS — I6523 Occlusion and stenosis of bilateral carotid arteries: Secondary | ICD-10-CM | POA: Diagnosis not present

## 2022-08-28 HISTORY — DX: Cerebral infarction, unspecified: I63.9

## 2022-08-28 LAB — DIFFERENTIAL
Abs Immature Granulocytes: 0.01 10*3/uL (ref 0.00–0.07)
Basophils Absolute: 0 10*3/uL (ref 0.0–0.1)
Basophils Relative: 1 %
Eosinophils Absolute: 0 10*3/uL (ref 0.0–0.5)
Eosinophils Relative: 1 %
Immature Granulocytes: 0 %
Lymphocytes Relative: 17 %
Lymphs Abs: 1.2 10*3/uL (ref 0.7–4.0)
Monocytes Absolute: 0.6 10*3/uL (ref 0.1–1.0)
Monocytes Relative: 8 %
Neutro Abs: 5.3 10*3/uL (ref 1.7–7.7)
Neutrophils Relative %: 73 %

## 2022-08-28 LAB — PROTIME-INR
INR: 1 (ref 0.8–1.2)
Prothrombin Time: 13 seconds (ref 11.4–15.2)

## 2022-08-28 LAB — TROPONIN I (HIGH SENSITIVITY)
Troponin I (High Sensitivity): 16 ng/L (ref <18)
Troponin I (High Sensitivity): 67 ng/L — ABNORMAL HIGH (ref <18)

## 2022-08-28 LAB — CBC
HCT: 39.1 % (ref 36.0–46.0)
Hemoglobin: 12.5 g/dL (ref 12.0–15.0)
MCH: 29.3 pg (ref 26.0–34.0)
MCHC: 32 g/dL (ref 30.0–36.0)
MCV: 91.8 fL (ref 80.0–100.0)
Platelets: 266 10*3/uL (ref 150–400)
RBC: 4.26 MIL/uL (ref 3.87–5.11)
RDW: 14 % (ref 11.5–15.5)
WBC: 7.1 10*3/uL (ref 4.0–10.5)
nRBC: 0 % (ref 0.0–0.2)

## 2022-08-28 LAB — COMPREHENSIVE METABOLIC PANEL
ALT: 22 U/L (ref 0–44)
AST: 25 U/L (ref 15–41)
Albumin: 3.8 g/dL (ref 3.5–5.0)
Alkaline Phosphatase: 55 U/L (ref 38–126)
Anion gap: 9 (ref 5–15)
BUN: 17 mg/dL (ref 8–23)
CO2: 26 mmol/L (ref 22–32)
Calcium: 9.3 mg/dL (ref 8.9–10.3)
Chloride: 103 mmol/L (ref 98–111)
Creatinine, Ser: 0.91 mg/dL (ref 0.44–1.00)
GFR, Estimated: >60 mL/min (ref >60)
Glucose, Bld: 107 mg/dL — ABNORMAL HIGH (ref 70–99)
Potassium: 4.1 mmol/L (ref 3.5–5.1)
Sodium: 138 mmol/L (ref 135–145)
Total Bilirubin: 0.6 mg/dL (ref 0.3–1.2)
Total Protein: 7.1 g/dL (ref 6.5–8.1)

## 2022-08-28 LAB — ETHANOL: Alcohol, Ethyl (B): <10 mg/dL (ref <10)

## 2022-08-28 MED ORDER — ENOXAPARIN SODIUM 40 MG/0.4ML IJ SOSY: 40.0000 mg | PREFILLED_SYRINGE | INTRAMUSCULAR | Status: DC

## 2022-08-28 MED ORDER — SENNOSIDES-DOCUSATE SODIUM 8.6-50 MG PO TABS: 1.0000 | ORAL_TABLET | Freq: Every evening | ORAL | Status: DC | PRN

## 2022-08-28 MED ORDER — SODIUM CHLORIDE 0.9 % IV BOLUS
500.0000 mL | Freq: Once | INTRAVENOUS | Status: AC
  Administered 2022-08-28: 500 mL via INTRAVENOUS

## 2022-08-28 MED ORDER — ACETAMINOPHEN 500 MG PO TABS
1000.0000 mg | ORAL_TABLET | Freq: Once | ORAL | Status: AC
  Administered 2022-08-28: 1000 mg via ORAL
  Filled 2022-08-28: qty 2

## 2022-08-28 MED ORDER — SODIUM CHLORIDE 0.9 % IV SOLN
100.0000 mL/h | INTRAVENOUS | Status: DC
  Administered 2022-08-28: 100 mL/h via INTRAVENOUS

## 2022-08-28 MED ORDER — LORAZEPAM 2 MG/ML IJ SOLN: 0.5000 mg | Freq: Once | INTRAMUSCULAR | Status: DC

## 2022-08-28 MED ORDER — ACETAMINOPHEN 160 MG/5ML PO SOLN: 650.0000 mg | ORAL | Status: DC | PRN

## 2022-08-28 MED ORDER — STROKE: EARLY STAGES OF RECOVERY BOOK: Freq: Once | Status: AC

## 2022-08-28 MED ORDER — ACETAMINOPHEN 650 MG RE SUPP: 650.0000 mg | RECTAL | Status: DC | PRN

## 2022-08-28 MED ORDER — CLOPIDOGREL BISULFATE 75 MG PO TABS: 75.0000 mg | ORAL_TABLET | Freq: Every day | ORAL | Status: DC

## 2022-08-28 MED ORDER — ASPIRIN 81 MG PO TBEC
81.0000 mg | DELAYED_RELEASE_TABLET | Freq: Every day | ORAL | Status: DC
  Administered 2022-08-28 – 2022-08-29 (×2): 81 mg via ORAL
  Filled 2022-08-28 (×2): qty 1

## 2022-08-28 MED ORDER — ACETAMINOPHEN 325 MG PO TABS: 650.0000 mg | ORAL_TABLET | ORAL | Status: DC | PRN

## 2022-08-28 MED ORDER — IOHEXOL 350 MG/ML SOLN
75.0000 mL | Freq: Once | INTRAVENOUS | Status: AC | PRN
  Administered 2022-08-28: 75 mL via INTRAVENOUS

## 2022-08-28 MED ORDER — ATORVASTATIN CALCIUM 40 MG PO TABS
40.0000 mg | ORAL_TABLET | Freq: Every day | ORAL | Status: DC
  Administered 2022-08-29: 40 mg via ORAL
  Filled 2022-08-28 (×2): qty 1

## 2022-08-28 MED ORDER — CLOPIDOGREL BISULFATE 75 MG PO TABS
300.0000 mg | ORAL_TABLET | Freq: Once | ORAL | Status: AC
  Administered 2022-08-28: 300 mg via ORAL
  Filled 2022-08-28: qty 4

## 2022-08-28 NOTE — H&P (Signed)
History and Physical    Marisa Livingston:[id number] DOB: 02-05-1936 DOA: 08/28/2022  PCP: Marisa Squibb, MD   Patient coming from: Home  I have personally briefly reviewed patient's old medical records in [place]  Chief Complaint: Headache, abnormal speech  HPI: Marisa Livingston is a 87 y.o. female with medical history significant for complete heart block, pacemaker status, systolic CHF, LBBB. Patient presented to the ED with complaints of abnormal speech, headache, and abnormal sensation to her left hand.  On arrival to the ED her symptoms had resolved. Symptoms started about noon.  She reports headache to the right side of her head, feeling of congestion in her nose, and an abnormal feeling to her right hand.  She denies weakness or numbness to her hand.  She was able to ambulate without difficulty.  She called her son and he thought she had different.  Patient reports her speech was slowed, but not slurred.  No change in vision.  Denies prior strokes.  Not on aspirin.  Recent pacemaker placement 07/26/2022 by Dr. Lovena Le.  ED Course: Blood pressure 143/64.  Stable vitals.  Head CT negative for acute abnormality. Neurology was consulted, recommended admission for TIA workup-to include CTA head and neck, MRI.  Review of Systems: As per HPI all other systems reviewed and negative.  Past Medical History:  Diagnosis Date   Allergic rhinitis    Hyperlipidemia    Insomnia    LBBB (left bundle branch block)    Osteopenia     Past Surgical History:  Procedure Laterality Date   COLONOSCOPY N/A 09/02/2015   Procedure: COLONOSCOPY;  Surgeon: Rogene Houston, MD;  [facility]: [facility];  Service: Endoscopy;  Laterality: N/A;  1025 - moved to 2/9 @ 8:30 - Ann notified pt   PACEMAKER IMPLANT N/A 07/26/2022   Procedure: PACEMAKER IMPLANT;  Surgeon: Evans Lance, MD;  Location: [facility];  Service: Cardiovascular;  Laterality: N/A;   POLYPECTOMY N/A 09/02/2015   Procedure:  POLYPECTOMY;  Surgeon: Rogene Houston, MD;  Location: [facility];  Service: Endoscopy;  Laterality: N/A;  Hepatic Flexure Polyp     reports that she has never smoked. She has never used smokeless tobacco. She reports that she does not drink alcohol and does not use drugs.  Allergies  Allergen Reactions   Codeine Nausea Only   Levaquin [Levofloxacin In D5w]     Sick     Family History  Problem Relation Age of Onset   Heart Problems Mother        had a pacemaker   Cancer Brother     Prior to Admission medications   Medication Sig Start Date End Date Taking? Authorizing Provider  acetaminophen (TYLENOL) 325 MG tablet Take 325-650 mg by mouth every 6 (six) hours as needed for mild pain, fever or headache.    [provider]  Calcium Citrate (CITRACAL PO) Take 1 tablet by mouth daily.    [provider]  Multiple Vitamins-Minerals (MULTIVITAMIN WITH MINERALS) tablet Take 1 tablet by mouth daily.    [provider]  sacubitril-valsartan (ENTRESTO) 24-26 MG Take 1 tablet by mouth 2 (two) times daily. 07/27/22   Marisa Friar, PA-C  simvastatin (ZOCOR) 10 MG tablet Take 10 mg by mouth daily.    [provider]    Physical Exam: Vitals:   08/28/22 1725 08/28/22 1726  BP: (!) 143/64   Pulse: 93   Resp: 18   Temp: 98  F (36.7 C)   TempSrc: Oral   SpO2: 97%   Weight:  80.3 kg  Height:  '5\' 2"'$  (1.575 m)    Constitutional: NAD, calm, comfortable Vitals:   08/28/22 1725 08/28/22 1726  BP: (!) 143/64   Pulse: 93   Resp: 18   Temp: 98 F (36.7 C)   TempSrc: Oral   SpO2: 97%   Weight:  80.3 kg  Height:  '5\' 2"'$  (1.575 m)   Eyes: PERRL, lids and conjunctivae normal ENMT: Mucous membranes are moist.  Neck: normal, supple, no masses, no thyromegaly Respiratory: clear to auscultation bilaterally, no wheezing, no crackles. Normal respiratory effort. No accessory muscle use.  Cardiovascular: Regular rate and rhythm, no murmurs / rubs /  gallops. No extremity edema. Extremities warm. Abdomen: no tenderness, no masses palpated. No hepatosplenomegaly. Bowel sounds positive.  Musculoskeletal: no clubbing / cyanosis. No joint deformity upper and lower extremities. Good ROM, no contractures. Normal muscle tone.  Skin: no rashes, lesions, ulcers. No induration Neurologic: Neurological:     Mental Status: She is alert.     GCS: GCS eye subscore is 4. GCS verbal subscore is 5. GCS motor subscore is 6.     Comments: Mental Status:  Alert, oriented, thought content appropriate, able to give a coherent history. Speech fluent without evidence of aphasia. Able to follow 2 step commands without difficulty.  Cranial Nerves:  II:  Peripheral visual fields grossly normal, pupils equal, round, reactive to light III,IV, VI: ptosis not present, extra-ocular motions intact bilaterally  V,VII: smile symmetric, eyebrows raise symmetric, facial light touch sensation equal VIII: hearing grossly normal to voice    XII: midline tongue extension without fassiculations Motor:  Normal tone.  5/5 to bilateral upper extremities. sensory: Sensation intact to light touch in all extremities.    CV: distal pulses palpable throughout    Psychiatric: Normal judgment and insight. Alert and oriented x 3. Normal mood.   Labs on Admission: I have personally reviewed following labs and imaging studies  CBC: Recent Labs  Lab 08/28/22 1812  WBC 7.1  NEUTROABS 5.3  HGB 12.5  HCT 39.1  MCV 91.8  PLT 950   Basic Metabolic Panel: Recent Labs  Lab 08/28/22 1812  NA 138  K 4.1  CL 103  CO2 26  GLUCOSE 107*  BUN 17  CREATININE 0.91  CALCIUM 9.3   GFR: Estimated Creatinine Clearance: 45.2 mL/min (by C-G formula based on SCr of 0.91 mg/dL). Liver Function Tests: Recent Labs  Lab 08/28/22 1812  AST 25  ALT 22  ALKPHOS 55  BILITOT 0.6  PROT 7.1  ALBUMIN 3.8   Coagulation Profile: Recent Labs  Lab 08/28/22 1812  INR 1.0   BNP (last 3  results) Recent Labs    11/29/21 0939  PROBNP 127    Radiological Exams on Admission: CT HEAD WO CONTRAST  Result Date: 08/28/2022 CLINICAL DATA:  Headache beginning this morning. EXAM: CT HEAD WITHOUT CONTRAST TECHNIQUE: Contiguous axial images were obtained from the base of the skull through the vertex without intravenous contrast. RADIATION DOSE REDUCTION: This exam was performed according to the departmental dose-optimization program which includes automated exposure control, adjustment of the mA and/or kV according to patient size and/or use of iterative reconstruction technique. COMPARISON:  None Available. FINDINGS: Brain: No evidence of acute infarction, hemorrhage, hydrocephalus, extra-axial collection or mass lesion/mass effect. Vascular: No hyperdense vessel or unexpected calcification. Skull: Normal. Negative for fracture or focal lesion. Sinuses/Orbits: Globes and orbits are unremarkable. Visualized  left maxillary sinus is opacified. Remaining visualized sinuses are clear. Other: None. IMPRESSION: 1. No acute intracranial abnormalities. Electronically Signed   By: Lajean Manes M.D.   On: 08/28/2022 17:52    EKG: Independently reviewed.  Sinus rhythm, LBBB.  Rate 77.  QTc 476.  No significant ST or T wave changes from prior  Assessment/Plan Principal Problem:   TIA (transient ischemic attack) Active Problems:   Chronic systolic congestive heart failure (HCC)   Complete heart block (HCC)   Pacemaker   Elevated troponin    Assessment and Plan: * TIA (transient ischemic attack) Right-sided headache, abnormal sensation to left hand, abnormal "slowed" speech, started about lunchtime. Symptoms have resolved.  Head CT negative for acute abnormality. - Tele neurology consulted, stroke workup to include CTA head and neck -Bolus Plavix 300 mg x 1, and initiate dual antiplatelet with aspirin and Plavix - Allow for permissive hypertension, -Passed bedside swallow evaluation - HgbA1c,  Lipid panel -  Limited Echocardiogram - MRI brain without contrast (pacemaker is MRI compatible) -Interrogate pacemaker -Will switch home simvastatin '10mg'$  daily to atorvastatin 40 daily  Elevated troponin Mild elevation in troponin 16 > 67.  She denies chest pain or any chest or respiratory symptoms. -Trend for now - Aspirin, Plavix, statins  Complete heart block ([place]) Pacemaker status.  EKG showing sinus rhythm, LBBB.  Chronic systolic congestive heart failure (HCC) Stable and compensated.  Recent echo 06/2022, EF of 50 to 55% with grade 1 DD. -Holding Entresto to allow for permissive hypertension   DVT prophylaxis: Lovenox Code Status: Full Family Communication:  Son Don at bedside Disposition Plan: Son- Timmothy Sours, at bedside is HCPOA Consults called: [facility] Admission status:  Obs tele  Author: Bethena Roys, MD 08/28/2022 10:27 PM  For on call review [email or web address].

## 2022-08-28 NOTE — ED Notes (Signed)
[employer] called for assistance with Interrogation of pacemaker

## 2022-08-28 NOTE — Consult Note (Signed)
[facility] TeleNeurology Consult Services  Stat Consult  Patient Name:   Marisa Livingston, Marisa Livingston Date of Birth:   1936/02/14 Identification Number:   MRN - [id number] Date of Service:   08/28/2022 20:13:34  Diagnosis:       G45.9 - Transient cerebral ischemic attack, unspecified  Impression 83 F presented with transient ataxia of the LUE, headache which had resolved. Hx syncope but none today. Rec plavix load of 300 mg and ASA 81 mg now and then ASA 81 and plavix 75 starting tomorrow (if able to swallow and if not rec just ASA suppository). Not a thrombolytic candidate 2/2 symptoms resolved. Not an IR candidate 2/2 NIH low (no severely newly debilitating symptoms and LVO not suspected), CTA can be done routinely. Rec toxic metabolic infectious workup (U/A, COVID/flu +/-RSV, chest xray, UDS, +/- NH3 etc as per ED MD/primary team discretion), rec admission for CVA workup. Rec PM interrogation for afib (PM MRI conditional).   Recommendations: Our recommendations are outlined below.  Diagnostic Studies : MRI head without contrast Please order CTA head and neck with contrast Please order TTE with bubble  Laboratory Studies : Lipid panel Please order Hemoglobin A1c Please order  Antithrombotic Medication : Bolus with Clopidogrel 300 mg bolus x1 and initiate dual antiplatelet therapy with Aspirin 81 mg daily and Clopidogrel 75 mg dailyPermissive hypertension, Antihypertensives with prn for first 24-48 hrs post stroke onset. If BP greater than 220/120 give Labetalol IV or Vasotec IV Please order Statins for LDL goal less than 70 Please order  Nursing Recommendations : IV Fluids, avoid dextrose containing fluids, Maintain euglycemiaNeuro checks q4 hrs x 24 hrs and then per shiftHead of bed 30 degreesContinue with Telemetry  Consultations : Recommend Speech therapy if failed dysphagia screenPhysical therapy/Occupational therapy  DVT Prophylaxis : Lovenox or LMW  Heparin  Disposition : Neurology will follow   ----------------------------------------------------------------------------------------------------    Metrics: [facility] Notification Time: 08/28/2022 20:10:18 Stamp Time: 08/28/2022 20:13:34 Callback Response Time: 08/28/2022 20:15:40  Primary Provider Notified of Diagnostic Impression and Management Plan on: 08/28/2022 20:50:40   CT HEAD: Reviewed no acute pathology    ----------------------------------------------------------------------------------------------------  Chief Complaint: left hand incoordination  History of Present Illness: Patient is a 87 year old Female. 29F Pt presents to the ED with difficulty grabbing items with left hand starting at noon. Had a headache too. No numbness or weakness. Never happened before. At 1300 had sudden onset of headache. Pt's son called her at 1500 and noted slurred speech. No dizziness or syncope today. Upon arrival to ER all symptoms resolved. Headache better after 2 tylenol. Pt has pacemaker in place, unable to do MRI tonight.      Past Medical History:      Hyperlipidemia      Coronary Artery Disease      There is no history of Stroke      There is no history of Seizures Other PMH:  PM in situ, CHF, CKD, syncope  Medications:  No Anticoagulant use  No Antiplatelet use Reviewed EMR for current medications  Allergies:  Reviewed  Social History: Smoking: No Alcohol Use: No Drug Use: No  Family History:  There is no family history of premature cerebrovascular disease pertinent to this consultation  ROS : 14 Points Review of Systems was performed and was negative except mentioned in HPI.  Past Surgical History: There Is No Surgical History Contributory To Today's Visit   Examination: BP(143/64), Pulse(93), Blood Glucose(107) 1A: Level of Consciousness - Alert; keenly responsive + 0 1B:  Ask Month and Age - Both Questions Right + 0 1C: Blink Eyes &  Squeeze Hands - Performs Both Tasks + 0 2: Test Horizontal Extraocular Movements - Normal + 0 3: Test Visual Fields - No Visual Loss + 0 4: Test Facial Palsy (Use Grimace if Obtunded) - Normal symmetry + 0 5A: Test Left Arm Motor Drift - No Drift for 10 Seconds + 0 5B: Test Right Arm Motor Drift - No Drift for 10 Seconds + 0 6A: Test Left Leg Motor Drift - No Drift for 5 Seconds + 0 6B: Test Right Leg Motor Drift - No Drift for 5 Seconds + 0 7: Test Limb Ataxia (FNF/Heel-Shin) - No Ataxia + 0 8: Test Sensation - Normal; No sensory loss + 0 9: Test Language/Aphasia - Normal; No aphasia + 0 10: Test Dysarthria - Normal + 0 11: Test Extinction/Inattention - No abnormality + 0  NIHSS Score: 0  Spoke with : Dr. Gilford Raid    Patient / Family was informed the Neurology Consult would occur via TeleHealth consult by way of interactive audio and video telecommunications and consented to receiving care in this manner.  Patient is being evaluated for possible acute neurologic impairment and high probability of imminent or life - threatening deterioration.I spent total of 35 minutes providing care to this patient, including time for face to face visit via telemedicine, review of medical records, imaging studies and discussion of findings with providers, the patient and / or family.   Dr Deitra Mayo   [facility] For Inpatient follow-up with [facility] physician please call [facility] [phone]. This is not an outpatient service. Post hospital discharge, please contact hospital directly.  Please do not communicate with [facility] physicians via secure chat. If you have any questions, Please contact [facility]. Please call or reconsult our service if there are any clinical or diagnostic changes.

## 2022-08-28 NOTE — Assessment & Plan Note (Addendum)
Right-sided headache, abnormal sensation to left hand, abnormal "slowed" speech, started about lunchtime. Symptoms have resolved.  Head CT negative for acute abnormality. - Tele neurology consulted, stroke workup to include CTA head and neck -Bolus Plavix 300 mg x 1, and initiate dual antiplatelet with aspirin and Plavix - Allow for permissive hypertension, -Passed bedside swallow evaluation - HgbA1c, Lipid panel -  Limited Echocardiogram - MRI brain without contrast (pacemaker is MRI compatible) -Interrogate pacemaker -Will switch home simvastatin '10mg'$  daily to atorvastatin 40 daily

## 2022-08-28 NOTE — Assessment & Plan Note (Signed)
Mild elevation in troponin 16 > 67.  She denies chest pain or any chest or respiratory symptoms. -Trend for now - Aspirin, Plavix, statins

## 2022-08-28 NOTE — Assessment & Plan Note (Signed)
Stable and compensated.  Recent echo 06/2022, EF of 50 to 55% with grade 1 DD. -Holding Entresto to allow for permissive hypertension

## 2022-08-28 NOTE — ED Provider Notes (Addendum)
[facility] Provider Note   CSN: [id number] Arrival date & time: 08/28/22  1703     History  Chief Complaint  Patient presents with   Headache    5/10 pain     Marisa Livingston is a 87 y.o. female.  Pt is a 87 yo female with a pmhx significant for insomnia, osteopenia, hld, intermittent CHB s/p recent pacemaker insertion (1/3) and lbbb.  Pt is in the ED today with a headache and speech problems.  Pt developed a headache around 0700.  She took tylenol, but h/a continued.  Pt's son spoke with pt and thought she sounded different, so EMS was called.  Pt denies any weakness.  She denies any vision disturbances.  No swallowing difficulties.  After going back in to talk to patient again, she remembers having weakness to her left arm and hand this afternoon.  That is gone now.       Home Medications Prior to Admission medications   Medication Sig Start Date End Date Taking? Authorizing Provider  acetaminophen (TYLENOL) 325 MG tablet Take 325-650 mg by mouth every 6 (six) hours as needed for mild pain, fever or headache.    [provider]  Calcium Citrate (CITRACAL PO) Take 1 tablet by mouth daily.    [provider]  Multiple Vitamins-Minerals (MULTIVITAMIN WITH MINERALS) tablet Take 1 tablet by mouth daily.    [provider]  sacubitril-valsartan (ENTRESTO) 24-26 MG Take 1 tablet by mouth 2 (two) times daily. 07/27/22   Shirley Friar, PA-C  simvastatin (ZOCOR) 10 MG tablet Take 10 mg by mouth daily.    [provider]      Allergies    Codeine and Levaquin [levofloxacin in d5w]    Review of Systems   Review of Systems  Neurological:  Positive for speech difficulty and headaches.  All other systems reviewed and are negative.   Physical Exam Updated Vital Signs BP (!) 143/64 (BP Location: Right Arm)   Pulse 93   Temp 98 F (36.7 C) (Oral)   Resp 18   Ht '5\' 2"'$  (1.575 m)   Wt 80.3 kg    SpO2 97%   BMI 32.37 kg/m  Physical Exam Vitals and nursing note reviewed.  Constitutional:      Appearance: She is well-developed. She is obese.  HENT:     Head: Normocephalic and atraumatic.     Mouth/Throat:     Mouth: Mucous membranes are moist.     Pharynx: Oropharynx is clear.  Eyes:     Extraocular Movements: Extraocular movements intact.     Pupils: Pupils are equal, round, and reactive to light.  Cardiovascular:     Rate and Rhythm: Normal rate and regular rhythm.     Heart sounds: Normal heart sounds.  Pulmonary:     Effort: Pulmonary effort is normal.     Breath sounds: Normal breath sounds.  Abdominal:     General: Bowel sounds are normal.     Palpations: Abdomen is soft.  Musculoskeletal:        General: Normal range of motion.     Cervical back: Normal range of motion and neck supple.  Skin:    General: Skin is warm and dry.  Neurological:     Mental Status: She is alert and oriented to person, place, and time.  Psychiatric:        Mood and Affect: Mood normal.  Speech: Speech normal.        Behavior: Behavior normal.     ED Results / Procedures / Treatments   Labs (all labs ordered are listed, but only abnormal results are displayed) Labs Reviewed  COMPREHENSIVE METABOLIC PANEL - Abnormal; Notable for the following components:      Result Value   Glucose, Bld 107 (*)    All other components within normal limits  TROPONIN I (HIGH SENSITIVITY) - Abnormal; Notable for the following components:   Troponin I (High Sensitivity) 67 (*)    All other components within normal limits  PROTIME-INR  CBC  DIFFERENTIAL  ETHANOL  URINALYSIS, ROUTINE W REFLEX MICROSCOPIC  TROPONIN I (HIGH SENSITIVITY)    EKG EKG Interpretation  Date/Time:  Monday August 28 2022 17:18:50 EST Ventricular Rate:  77 PR Interval:  165 QRS Duration: 147 QT Interval:  420 QTC Calculation: 476 R Axis:   6 Text Interpretation: Sinus rhythm Left bundle branch block No  significant change since last tracing Confirmed by Isla Pence [phone]) on 08/28/2022 6:17:43 PM  Radiology CT HEAD WO CONTRAST  Result Date: 08/28/2022 CLINICAL DATA:  Headache beginning this morning. EXAM: CT HEAD WITHOUT CONTRAST TECHNIQUE: Contiguous axial images were obtained from the base of the skull through the vertex without intravenous contrast. RADIATION DOSE REDUCTION: This exam was performed according to the departmental dose-optimization program which includes automated exposure control, adjustment of the mA and/or kV according to patient size and/or use of iterative reconstruction technique. COMPARISON:  None Available. FINDINGS: Brain: No evidence of acute infarction, hemorrhage, hydrocephalus, extra-axial collection or mass lesion/mass effect. Vascular: No hyperdense vessel or unexpected calcification. Skull: Normal. Negative for fracture or focal lesion. Sinuses/Orbits: Globes and orbits are unremarkable. Visualized left maxillary sinus is opacified. Remaining visualized sinuses are clear. Other: None. IMPRESSION: 1. No acute intracranial abnormalities. Electronically Signed   By: Lajean Manes M.D.   On: 08/28/2022 17:52    Procedures Procedures    Medications Ordered in ED Medications  sodium chloride 0.9 % bolus 500 mL (0 mLs Intravenous Stopped 08/28/22 1921)    Followed by  0.9 %  sodium chloride infusion (100 mL/hr Intravenous New Bag/Given 08/28/22 1920)  clopidogrel (PLAVIX) tablet 300 mg (has no administration in time range)  aspirin EC tablet 81 mg (has no administration in time range)  acetaminophen (TYLENOL) tablet 1,000 mg (1,000 mg Oral Given 08/28/22 1920)  iohexol (OMNIPAQUE) 350 MG/ML injection 75 mL (75 mLs Intravenous Contrast Given 08/28/22 2134)    ED Course/ Medical Decision Making/ A&P                             Medical Decision Making Amount and/or Complexity of Data Reviewed Labs: ordered. Radiology: ordered.  Risk OTC drugs. Prescription drug  management. Decision regarding hospitalization.   This patient presents to the ED for concern of headache/speech problems, this involves an extensive number of treatment options, and is a complaint that carries with it a high risk of complications and morbidity.  The differential diagnosis includes cva, tia,electrolyte abn, infection   Co morbidities that complicate the patient evaluation  nsomnia, osteopenia, hld, and lbbb   Additional history obtained:  Additional history obtained from epic chart review External records from outside source obtained and reviewed including EMS report   Lab Tests:  I Ordered, and personally interpreted labs.  The pertinent results include:  cbc nl, cmp nl, etoh neg, inr neg; 1st trop 16 and  2nd is 32   Imaging Studies ordered:  I ordered imaging studies including ct head  I independently visualized and interpreted imaging which showed  IMPRESSION:  1. No acute intracranial abnormalities.   I agree with the radiologist interpretation   Cardiac Monitoring:  The patient was maintained on a cardiac monitor.  I personally viewed and interpreted the cardiac monitored which showed an underlying rhythm of: nsr   Medicines ordered and prescription drug management:  I ordered medication including tylenol   for h/a and plavix/asa for tia/cva  Reevaluation of the patient after these medicines showed that the patient improved I have reviewed the patients home medicines and have made adjustments as needed   Test Considered:  Cta head/neck; mri   Critical Interventions:  teleneuro   Consultations Obtained:  I requested consultation with the teleneurologist (Dr. Kirby Crigler),  and discussed lab and imaging findings as well as pertinent plan - she recommends admission for tia/stroke work up.  Pt d/w Dr. Denton Brick (triad) for admission.  Problem List / ED Course:  TIA:  Pt's sx have resolved, so she's not a candidate for tnk or ir.  asa/plavix  given.  Cta head/neck ordered.  Pt has a pacemaker, but it is mri conditional, so she should be able to get it done during the day if needed. Troponin elevation:  continue to trend.  Pacemaker interrogated and results show no arrhythmia  Reevaluation:  After the interventions noted above, I reevaluated the patient and found that they have :improved   Social Determinants of Health:  Lives alone   Dispostion:  After consideration of the diagnostic results and the patients response to treatment, I feel that the patent would benefit from admission.    CRITICAL CARE Performed by: Isla Pence   Total critical care time: 45 minutes  Critical care time was exclusive of separately billable procedures and treating other patients.  Critical care was necessary to treat or prevent imminent or life-threatening deterioration.  Critical care was time spent personally by me on the following activities: development of treatment plan with patient and/or surrogate as well as nursing, discussions with consultants, evaluation of patient's response to treatment, examination of patient, obtaining history from patient or surrogate, ordering and performing treatments and interventions, ordering and review of laboratory studies, ordering and review of radiographic studies, pulse oximetry and re-evaluation of patient's condition.         Final Clinical Impression(s) / ED Diagnoses Final diagnoses:  TIA (transient ischemic attack)  Acute nonintractable headache, unspecified headache type  Elevated troponin    Rx / DC Orders ED Discharge Orders     None         Isla Pence, MD 08/28/22 2145    Isla Pence, MD 08/28/22 2247

## 2022-08-28 NOTE — ED Notes (Signed)
Patient going to CT

## 2022-08-28 NOTE — ED Triage Notes (Signed)
Patient complains of headache that started at 7 am. She denies any weakness. She feels like her voice sounds different. She has a headache on theright side 5/10 discomfort

## 2022-08-28 NOTE — Assessment & Plan Note (Signed)
Pacemaker status.  EKG showing sinus rhythm, LBBB.

## 2022-08-29 ENCOUNTER — Observation Stay (HOSPITAL_BASED_OUTPATIENT_CLINIC_OR_DEPARTMENT_OTHER): Payer: PPO

## 2022-08-29 ENCOUNTER — Other Ambulatory Visit (HOSPITAL_COMMUNITY): Payer: Self-pay | Admitting: *Deleted

## 2022-08-29 ENCOUNTER — Observation Stay (HOSPITAL_COMMUNITY): Payer: PPO

## 2022-08-29 DIAGNOSIS — E785 Hyperlipidemia, unspecified: Secondary | ICD-10-CM | POA: Diagnosis not present

## 2022-08-29 DIAGNOSIS — I6523 Occlusion and stenosis of bilateral carotid arteries: Secondary | ICD-10-CM | POA: Diagnosis not present

## 2022-08-29 DIAGNOSIS — G459 Transient cerebral ischemic attack, unspecified: Secondary | ICD-10-CM | POA: Diagnosis not present

## 2022-08-29 LAB — ECHOCARDIOGRAM LIMITED
Area-P 1/2: 4.1 cm2
Height: 62 in
S' Lateral: 2.9 cm
Weight: 2832 oz

## 2022-08-29 LAB — LIPID PANEL
Cholesterol: 150 mg/dL (ref 0–200)
HDL: 59 mg/dL (ref >40)
LDL Cholesterol: 77 mg/dL (ref 0–99)
Total CHOL/HDL Ratio: 2.5 RATIO
Triglycerides: 70 mg/dL (ref <150)
VLDL: 14 mg/dL (ref 0–40)

## 2022-08-29 LAB — HEMOGLOBIN A1C
Hgb A1c MFr Bld: 5.8 % — ABNORMAL HIGH (ref 4.8–5.6)
Mean Plasma Glucose: 119.76 mg/dL

## 2022-08-29 LAB — TROPONIN I (HIGH SENSITIVITY): Troponin I (High Sensitivity): 81 ng/L — ABNORMAL HIGH (ref <18)

## 2022-08-29 MED ORDER — CLOPIDOGREL BISULFATE 75 MG PO TABS: 75.0000 mg | ORAL_TABLET | Freq: Every day | ORAL | Status: DC

## 2022-08-29 MED ORDER — CLOPIDOGREL BISULFATE 75 MG PO TABS: 75.0000 mg | ORAL_TABLET | Freq: Every day | ORAL | 0 refills | Status: DC

## 2022-08-29 MED ORDER — ASPIRIN 81 MG PO TBEC: 81.0000 mg | DELAYED_RELEASE_TABLET | Freq: Every day | ORAL | 12 refills | Status: AC

## 2022-08-29 MED ORDER — CLOPIDOGREL BISULFATE 75 MG PO TABS: 75.0000 mg | ORAL_TABLET | Freq: Every day | ORAL | 0 refills | Status: AC

## 2022-08-29 MED ORDER — SODIUM CHLORIDE 0.9 % IV BOLUS
1000.0000 mL | Freq: Once | INTRAVENOUS | Status: AC
  Administered 2022-08-29: 1000 mL via INTRAVENOUS

## 2022-08-29 MED ORDER — ATORVASTATIN CALCIUM 40 MG PO TABS: 40.0000 mg | ORAL_TABLET | Freq: Every day | ORAL | 0 refills | Status: DC

## 2022-08-29 NOTE — Plan of Care (Signed)
  Problem: Acute Rehab PT Goals(only PT should resolve) Goal: Pt Will Go Supine/Side To Sit Outcome: Progressing Flowsheets (Taken 08/29/2022 1141) Pt will go Supine/Side to Sit:  Independently  with modified independence Goal: Patient Will Transfer Sit To/From Stand Outcome: Progressing Flowsheets (Taken 08/29/2022 1141) Patient will transfer sit to/from stand:  with modified independence  with supervision Goal: Pt Will Transfer Bed To Chair/Chair To Bed Outcome: Progressing Flowsheets (Taken 08/29/2022 1141) Pt will Transfer Bed to Chair/Chair to Bed:  with modified independence  with supervision Goal: Pt Will Ambulate Outcome: Progressing Flowsheets (Taken 08/29/2022 1141) Pt will Ambulate:  > 125 feet  with modified independence  with supervision  with rolling walker   11:41 AM, 08/29/22 Lonell Grandchild, MPT Physical Therapist with [facility] [phone] office [phone] mobile phone

## 2022-08-29 NOTE — Evaluation (Signed)
Physical Therapy Evaluation Patient Details Name: Marisa Livingston MRN: [id number] DOB: 1936/01/03 Today's Date: 08/29/2022  History of Present Illness  Ada A Riggenbach is a 87 y.o. female with medical history significant for complete heart block, pacemaker status, systolic CHF, LBBB.  Patient presented to the ED with complaints of abnormal speech, headache, and abnormal sensation to her left hand.  On arrival to the ED her symptoms had resolved.  Symptoms started about noon.  She reports headache to the right side of her head, feeling of congestion in her nose, and an abnormal feeling to her right hand.  She denies weakness or numbness to her hand.  She was able to ambulate without difficulty.  She called her son and he thought she had different.  Patient reports her speech was slowed, but not slurred.  No change in vision.  Denies prior strokes.  Not on aspirin.     Recent pacemaker placement 07/26/2022 by Dr. Lovena Le.   Clinical Impression  Patient demonstrates good return for bed mobility, transfers, but  requires hand held assist and/or leaning on nearby objects for support when taking steps without AD, safer using RW with good return demonstrated for use ambulating in room/hallway without loss of balance.  Patient tolerated sitting up in chair after therapy with hear son present in room.  Patient will benefit from continued skilled physical therapy in hospital and recommended venue below to increase strength, balance, endurance for safe ADLs and gait.       Recommendations for follow up therapy are one component of a multi-disciplinary discharge planning process, led by the attending physician.  Recommendations may be updated based on patient status, additional functional criteria and insurance authorization.  Follow Up Recommendations Home health PT      Assistance Recommended at Discharge Set up Supervision/Assistance  Patient can return home with the following  A little help with walking  and/or transfers;A little help with bathing/dressing/bathroom;Help with stairs or ramp for entrance;Assistance with cooking/housework    Equipment Recommendations Rolling walker (2 wheels)  Recommendations for Other Services       Functional Status Assessment Patient has not had a recent decline in their functional status     Precautions / Restrictions Precautions Precautions: Fall Restrictions Weight Bearing Restrictions: No      Mobility  Bed Mobility Overal bed mobility: Modified Independent                  Transfers Overall transfer level: Needs assistance Equipment used: 1 person hand held assist, Rolling walker (2 wheels) Transfers: Sit to/from Stand, Bed to chair/wheelchair/BSC Sit to Stand: Supervision, Min guard   Step pivot transfers: Supervision, Min guard       General transfer comment: requires hand held assist and/or leaning on nearby objects for support when not using an AD, safer using RW    Ambulation/Gait Ambulation/Gait assistance: Supervision, Min guard Gait Distance (Feet): 100 Feet Assistive device: Rolling walker (2 wheels), None Gait Pattern/deviations: Decreased step length - right, Decreased step length - left, Decreased stride length Gait velocity: decreased     General Gait Details: requires hand held assist and/or leaning on nearby objects for support when taking steps without AD, safer using RW demonstrating good return for use ambulating in room/hallway without loss of balance  Stairs            Wheelchair Mobility    Modified Rankin (Stroke Patients Only)       Balance Overall balance assessment: Needs assistance Sitting-balance support: Feet  supported, No upper extremity supported Sitting balance-Leahy Scale: Good Sitting balance - Comments: seated at EOB   Standing balance support: During functional activity, No upper extremity supported Standing balance-Leahy Scale: Poor Standing balance comment: fair/poor  without AD, fair good using RW                             Pertinent Vitals/Pain Pain Assessment Pain Assessment: No/denies pain    Home Living Family/patient expects to be discharged to:: Private residence Living Arrangements: Alone Available Help at Discharge: Family;Available 24 hours/day Type of Home: House Home Access: Stairs to enter Entrance Stairs-Rails: Right;Left;Can reach both Entrance Stairs-Number of Steps: 3   Home Layout: One level Home Equipment: Standard Walker;Cane - single point;Shower seat      Prior Function Prior Level of Function : Independent/Modified Independent;Driving             Mobility Comments: household and short distanced community ambulator without AD, drives ADLs Comments: Independent     Hand Dominance   Dominant Hand: Right    Extremity/Trunk Assessment   Upper Extremity Assessment Upper Extremity Assessment: Generalized weakness    Lower Extremity Assessment Lower Extremity Assessment: Generalized weakness    Cervical / Trunk Assessment Cervical / Trunk Assessment: Normal  Communication   Communication: No difficulties  Cognition Arousal/Alertness: Awake/alert Behavior During Therapy: WFL for tasks assessed/performed Overall Cognitive Status: Within Functional Limits for tasks assessed                                          General Comments      Exercises     Assessment/Plan    PT Assessment Patient needs continued PT services  PT Problem List Decreased strength;Decreased activity tolerance;Decreased balance;Decreased mobility       PT Treatment Interventions DME instruction;Gait training;Stair training;Functional mobility training;Therapeutic activities;Therapeutic exercise;Patient/family education;Balance training    PT Goals (Current goals can be found in the Care Plan section)  Acute Rehab PT Goals Patient Stated Goal: return home with family to assist PT Goal Formulation:  With patient/family Time For Goal Achievement: 09/01/22 Potential to Achieve Goals: Good    Frequency Min 2X/week     Co-evaluation               AM-PAC PT "6 Clicks" Mobility  Outcome Measure Help needed turning from your back to your side while in a flat bed without using bedrails?: None Help needed moving from lying on your back to sitting on the side of a flat bed without using bedrails?: None Help needed moving to and from a bed to a chair (including a wheelchair)?: A Little Help needed standing up from a chair using your arms (e.g., wheelchair or bedside chair)?: A Little Help needed to walk in hospital room?: A Little Help needed climbing 3-5 steps with a railing? : A Little 6 Click Score: 20    End of Session   Activity Tolerance: Patient tolerated treatment well Patient left: in chair;with call bell/phone within reach;with family/visitor present Nurse Communication: Mobility status PT Visit Diagnosis: Unsteadiness on feet (R26.81);Other abnormalities of gait and mobility (R26.89);Muscle weakness (generalized) (M62.81)    Time: 4332-9518 PT Time Calculation (min) (ACUTE ONLY): 23 min   Charges:   PT Evaluation $PT Eval Moderate Complexity: 1 Mod PT Treatments $Therapeutic Activity: 23-37 mins  11:40 AM, 08/29/22 Lonell Grandchild, MPT Physical Therapist with [facility] [phone] office [phone] mobile phone

## 2022-08-29 NOTE — TOC Initial Note (Signed)
Transition of Care [facility]) - Initial/Assessment Note    Patient Details  Name: Marisa Livingston MRN: [id number] Date of Birth: 1936/06/15  [facility] CM/SW Contact:    Iona Beard, [place] Phone Number: 08/29/2022, 10:05 AM  Clinical Narrative:                 CSW updated that pt is recommended for [facility] PT at D/C. CSW spoke to pts son who is in room with pt about recommendation. Pt and son are agreeable to [facility] PT being set up and do not have an agency preference. CSW spoke to [place] with [facility] who states she can accept the [facility] referral at this time. CSW requested that MD place [facility] PT only orders. CSW confirmed with pts son that pt has a walker in the home to use when needed for ambulation.   Expected Discharge Plan: [place] Barriers to Discharge: Continued Medical Work up   Patient Goals and CMS Choice Patient states their goals for this hospitalization and ongoing recovery are:: return home CMS Medicare.gov Compare Post Acute Care list provided to:: Patient Choice offered to / list presented to : Patient, Adult Children      Expected Discharge Plan and Services In-house Referral: Clinical Social Work Discharge Planning Services: CM Consult Post Acute Care Choice: [place] arrangements for the past 2 months: [place]: PT [place]: [place] Date [place]: 08/29/22   Representative spoke with at [place]: Lattie Haw  Prior Living Arrangements/Services Living arrangements for the past 2 months: [place] Lives with:: Self Patient language and need for interpreter reviewed:: Yes Do you feel safe going back to the place where you live?: Yes      Need for Family Participation in Patient Care: Yes (Comment) Care giver support system in place?: Yes (comment) Current home services: DME Criminal Activity/Legal Involvement Pertinent to Current  Situation/Hospitalization: No - Comment as needed  Activities of Daily Living Home Assistive Devices/Equipment: None ADL Screening (condition at time of admission) Patient's cognitive ability adequate to safely complete daily activities?: Yes Is the patient deaf or have difficulty hearing?: No Does the patient have difficulty seeing, even when wearing glasses/contacts?: No Does the patient have difficulty concentrating, remembering, or making decisions?: No Patient able to express need for assistance with ADLs?: Yes Does the patient have difficulty dressing or bathing?: No Independently performs ADLs?: Yes (appropriate for developmental age) Does the patient have difficulty walking or climbing stairs?: No Weakness of Legs: None Weakness of Arms/Hands: None  Permission Sought/Granted                  Emotional Assessment Appearance:: Appears stated age Attitude/Demeanor/Rapport: Engaged Affect (typically observed): Accepting Orientation: : Oriented to Self, Oriented to Place, Oriented to  Time, Oriented to Situation Alcohol / Substance Use: Not Applicable Psych Involvement: No (comment)  Admission diagnosis:  TIA (transient ischemic attack) [G45.9] Elevated troponin [R79.89] Acute nonintractable headache, unspecified headache type [R51.9] Patient Active Problem List   Diagnosis Date Noted   TIA (transient ischemic attack) 08/28/2022   Pacemaker 08/28/2022   Elevated troponin 08/28/2022   Complete heart block ([place]) 07/21/2022   Osteopenia 40/04/2724   Chronic systolic congestive heart failure ([place]) 12/15/2020   Tachycardia 06/19/2020   Left bundle branch  block (LBBB) on electrocardiogram 06/09/2020   Serum potassium elevated 06/09/2020   Chronic kidney disease, stage 3 ([place]) 04/17/2018   Hyperlipidemia LDL goal <130 11/20/2012   PCP:  Celene Squibb, MD Pharmacy:   [place], [place] [address] [place] [address] Phone:  [phone] Fax: [phone]     Social Determinants of Health (SDOH) Social History: [place]: No Food Insecurity (08/28/2022)  Housing: Low Risk  (08/28/2022)  Transportation Needs: No Transportation Needs (08/28/2022)  Utilities: Not At Risk (08/28/2022)  Depression (PHQ2-9): Low Risk  (12/23/2020)  Tobacco Use: Low Risk  (08/28/2022)   SDOH Interventions:     Readmission Risk Interventions     No data to display

## 2022-08-29 NOTE — TOC Progression Note (Signed)
  Transition of Care (TOC) Screening Note   Patient Details  Name: Marisa Livingston Date of Birth: January 26, 1936   [facility] CM/SW Contact:    Shade Flood, LCSW Phone Number: 08/29/2022, 7:59 AM    PT eval pending. TOC will follow up if there are recommendations for HH/DME.  Transition of Care Department [facility]) has reviewed patient and no TOC needs have been identified at this time. We will continue to monitor patient advancement through interdisciplinary progression rounds. If new patient transition needs arise, please place a TOC consult.

## 2022-08-29 NOTE — Consult Note (Addendum)
I connected with  Marisa Livingston on 08/29/22 by a video enabled telemedicine application and verified that I am speaking with the correct person using two identifiers.   I discussed the limitations of evaluation and management by telemedicine. The patient expressed understanding and agreed to proceed.  Location of patient: [facility] Location of hospital: [facility]  Neurology Consultation Reason for Consult: headache Referring Physician: Dr Isla Pence  CC: headache  History is obtained from: patient, chart review  HPI: Marisa Livingston is a 87 y.o. female with past medical history of complete heart block status post pacemaker placement which is MRI compatible, congestive heart failure, hyperlipidemia who presented with right-sided headache, abnormal speech, abnormal sensation and weakness in the left hand which has since resolved.  Neurology was consulted due to concern for stroke.  Patient states she woke up around 7 AM and was at her baseline.  Around noon she noticed headache in the right frontal region, 5/10.  She took some Tylenol.  She then noticed that her speech was slurred and weakness in her left hand.  The weakness lasted for few minutes and then completely resolved.  She eventually came to the hospital because the headache was not improving.  On arrival to the hospital her blood pressure was 143/64.  CT head was done which did not show any acute abnormality.  MRI brain could not be obtained due to pacemaker which is MRI compatible but technical limitations at [facility].  Patient denies taking any antiplatelet/anticoagulants.  Denies any recent illness.  Denies similar symptoms in the past.  Last known normal: noon on 08/28/2022 Event happened at home No tPA as symptoms resolved No thrombectomy as no large vessel occlusion mRS 0  ROS: All other systems reviewed and negative except as noted in the HPI.   Past Medical History:  Diagnosis Date   Allergic rhinitis     Hyperlipidemia    Insomnia    LBBB (left bundle branch block)    Osteopenia     Family History  Problem Relation Age of Onset   Heart Problems Mother        had a pacemaker   Cancer Brother     Social History:  reports that she has never smoked. She has never used smokeless tobacco. She reports that she does not drink alcohol and does not use drugs.   Medications Prior to Admission  Medication Sig Dispense Refill Last Dose   acetaminophen (TYLENOL) 325 MG tablet Take 325-650 mg by mouth every 6 (six) hours as needed for mild pain, fever or headache.   08/28/2022   Calcium Citrate (CITRACAL PO) Take 1 tablet by mouth daily.   08/28/2022   Multiple Vitamins-Minerals (MULTIVITAMIN WITH MINERALS) tablet Take 1 tablet by mouth daily.   08/28/2022   sacubitril-valsartan (ENTRESTO) 24-26 MG Take 1 tablet by mouth 2 (two) times daily.   08/28/2022   simvastatin (ZOCOR) 10 MG tablet Take 10 mg by mouth daily.   08/28/2022      Exam: Current vital signs: BP 133/71 (BP Location: Right Arm)   Pulse 74   Temp 98.1 F (36.7 C) (Oral)   Resp 15   Ht '5\' 2"'$  (1.575 m)   Wt 80.3 kg   SpO2 97%   BMI 32.37 kg/m  Vital signs in last 24 hours: Temp:  [98 F (36.7 C)-98.1 F (36.7 C)] 98.1 F (36.7 C) (02/06 0450) Pulse Rate:  [66-93] 74 (02/06 0450) Resp:  [15-18] 15 (02/06 0450) BP: (  133-143)/(48-71) 133/71 (02/06 0450) SpO2:  [96 %-98 %] 97 % (02/06 0450) Weight:  [80.3 kg] 80.3 kg (02/05 1726)   Physical Exam  Constitutional: Appears well-developed and well-nourished.  Psych: Affect appropriate to situation Eyes: No scleral injection Neuro:  NIHSS 0   I have reviewed labs in epic and the results pertinent to this consultation are: CBC:  Recent Labs  Lab 08/28/22 1812  WBC 7.1  NEUTROABS 5.3  HGB 12.5  HCT 39.1  MCV 91.8  PLT 440    Basic Metabolic Panel:  Lab Results  Component Value Date   NA 138 08/28/2022   K 4.1 08/28/2022   CO2 26 08/28/2022   GLUCOSE 107 (H)  08/28/2022   BUN 17 08/28/2022   CREATININE 0.91 08/28/2022   CALCIUM 9.3 08/28/2022   GFRNONAA >60 08/28/2022   GFRAA 57 (L) 09/16/2020   Lipid Panel:  Lab Results  Component Value Date   LDLCALC 77 08/28/2022   HgbA1c:  Lab Results  Component Value Date   HGBA1C 5.8 (H) 08/28/2022   Urine Drug Screen: No results found for: "LABOPIA", "COCAINSCRNUR", "LABBENZ", "AMPHETMU", "THCU", "LABBARB"  Alcohol Level     Component Value Date/Time   ETH <10 08/28/2022 1812   I have reviewed the images obtained:  CT Head without contrast 08/28/2022: No acute intracranial abnormalities.   CT angio Head and Neck with contrast 08/28/2022: 1. No large vessel occlusion. 2. No carotid bifurcation disease. 3. Atherosclerotic change in both carotid siphon regions but without stenosis greater than 30%. 4. Focal stenosis within an inferior division right MCA branch that could place the patient at risk of an infarction in that territory. The clinical history is that of headache. If the patient had a clinical presentation of right brain stroke, I could not exclude the possibility that this stenosis could be due to a nonocclusive embolus. In the absence of a stroke presentation, this is presumed to represent a chronic stenosis. 5. Aortic atherosclerosis.  US Carotid 08/29/2022: Minor carotid atherosclerosis.  Negative for stenosis.  Degree of narrowing less than 50% bilaterally.  Patent antegrade vertebral flow bilaterally  ASSESSMENT/PLAN: 87 year old female who presented with sudden onset right-sided headache, speech disturbance, weakness of left side which has since resolved, lasting about  TIA -Likely due to intracranial stenosis versus embolic  Recommendations: -Recommend MRI brain without contrast to look for stroke as soon as possible as an outpatient -Recommend aspirin 81 mg daily and Plavix and 75 mg daily for 3 months followed by aspirin 81 mg daily -Recommend atorvastatin 40 mg  daily -Pacemaker interrogation to see if patient went into A-fib yesterday.  If unable to, can consider 30-day event monitor at the time of discharge  -If TTE within normal limits, okay to discharge from neurology standpoint -Goal blood pressure: Normotension - PT/OT -Follow-up with neurology in 3 months -Discussed plan with patient and son at bedside and Dr. Roger Shelter via secure chat   Thank you for allowing [place] to participate in the care of this patient. If you have any further questions, please contact  me or neurohospitalist.   Zeb Comfort Epilepsy [facility]

## 2022-08-29 NOTE — Progress Notes (Signed)
SLP Cancellation Note  Patient Details Name: Marisa Livingston MRN: [id number] DOB: 1936/03/11   Cancelled treatment:       Reason Eval/Treat Not Completed: SLP screened, no needs identified, will sign off; pt's symptoms have resolved and no concerns noted by SLP. Thank you for this consult.   Pat Rhiann Boucher,M.S., QIH-KVQ 08/29/2022, 12:42 PM

## 2022-08-29 NOTE — Discharge Summary (Signed)
Physician Discharge Summary   Patient: Marisa Livingston MRN: [id number] DOB: 01/30/1936  Admit date:     08/28/2022  Discharge date: 08/29/22  Discharge Physician: Deatra James   PCP: Celene Squibb, MD   Recommendations at discharge:    Follow-up with neurologist as an outpatient in 1-2 weeks Follow-up MRI as an outpatient Follow-up with a PCP in 1-2 weeks  Discharge Diagnoses: Principal Problem:   TIA (transient ischemic attack) Active Problems:   Chronic systolic congestive heart failure (HCC)   Complete heart block (HCC)   Pacemaker   Elevated troponin  Resolved Problems:   * No resolved hospital problems. *  Hospital Course:  Marisa Livingston is a 87 y.o. female with medical history significant for complete heart block, pacemaker status, systolic CHF, LBBB. Patient presented to the ED with complaints of abnormal speech, headache, and abnormal sensation to her left hand.  On arrival to the ED her symptoms had resolved. Symptoms started about noon.  She reports headache to the right side of her head, feeling of congestion in her nose, and an abnormal feeling to her right hand.  She denies weakness or numbness to her hand.  She was able to ambulate without difficulty.  She called her son and he thought she had different.  Patient reports her speech was slowed, but not slurred.  No change in vision.  Denies prior strokes.  Not on aspirin.   Recent pacemaker placement 07/26/2022 by Dr. Lovena Le.   ED Course: Blood pressure 143/64.  Stable vitals.  Head CT negative for acute abnormality. Neurology was consulted, recommended admission for TIA workup-to include CTA head and neck, MRI.     Assessment and Plan: * TIA (transient ischemic attack) -Symptoms has resolved  Right-sided headache, abnormal sensation to left hand, abnormal "slowed" speech, started about lunchtime. Symptoms have resolved.  - Head CT negative for acute abnormality. - Tele neurology consulted, stroke workup  to include CTA head and neck -Bolus Plavix 300 mg x 1, and initiate dual antiplatelet with aspirin and Plavix  Per neurology recommendations: Outpatient follow-up with neurology, MRI as an outpatient -aspirin 81 mg daily and Plavix 70 mg daily for 3 months followed by aspirin 81 mg daily. - switching simvastatin to atorvastatin 40 mg daily.  - Allow for permissive hypertension, -Passed bedside swallow evaluation-completed tolerating p.o. - HgbA1c 5.8, lipid panel -LDL 77 -  Echocardiogram --within normal limits - MRI brain without contrast (pacemaker is MRI compatible) -was not obtained -Interrogate pacemaker -Will switch home simvastatin '10mg'$  daily to atorvastatin 40 daily -Bilateral carotid studies within normal limits less than 50% stenosis bilaterally  Elevated troponin Mild elevation in troponin 16 > 67.  She denies chest pain or any chest or respiratory symptoms. -Trend for now - Aspirin, Plavix, statins  Complete heart block ([place]) Pacemaker status.  EKG showing sinus rhythm, LBBB.  Chronic systolic congestive heart failure (HCC) Stable and compensated.  Recent echo 06/2022, EF of 50 to 55% with grade 1 DD. -Holding Entresto to allow for permissive hypertension      Consultants: Neurology Procedures performed:/Echo/bilateral carotid studies Disposition: Home Diet recommendation:  Discharge Diet Orders (From admission, onward)     Start     Ordered   08/29/22 0000  Diet - low sodium heart healthy        08/29/22 1223           Cardiac diet DISCHARGE MEDICATION: Allergies as of 08/29/2022       Reactions  Codeine Nausea Only   Levaquin [levofloxacin In D5w]    Sick         Medication List     STOP taking these medications    simvastatin 10 MG tablet Commonly known as: ZOCOR       TAKE these medications    acetaminophen 325 MG tablet Commonly known as: TYLENOL Take 325-650 mg by mouth every 6 (six) hours as needed for mild pain, fever or  headache.   aspirin EC 81 MG tablet Take 1 tablet (81 mg total) by mouth daily. Swallow whole.   atorvastatin 40 MG tablet Commonly known as: LIPITOR Take 1 tablet (40 mg total) by mouth daily.   CITRACAL PO Take 1 tablet by mouth daily.   clopidogrel 75 MG tablet Commonly known as: PLAVIX Take 1 tablet (75 mg total) by mouth daily.   Entresto 24-26 MG Generic drug: sacubitril-valsartan Take 1 tablet by mouth 2 (two) times daily.   multivitamin with minerals tablet Take 1 tablet by mouth daily.               Durable Medical Equipment  (From admission, onward)           Start     Ordered   08/29/22 1221  For home use only DME Walker rolling  Once       Comments: Patient has to lean on nearby objects or hand held assist for support when walking without AD, safer using RW with good carryover demonstrated  Question Answer Comment  Walker: With [place]   Patient needs a walker to treat with the following condition Gait difficulty      08/29/22 [place]. Follow up.   Why: Agency will reach out within 48 hours of patients hospital discharge to set up first home visit. Contact information: [place] [address] [phone]                Discharge Exam: Danley Danker Weights   08/28/22 1726  Weight: 80.3 kg        General:  AAO x 3,  cooperative, no distress;   HEENT:  Normocephalic, PERRL, otherwise with in Normal limits   Neuro:  CNII-XII intact. , normal motor and sensation, reflexes intact   Lungs:   Clear to auscultation BL, Respirations unlabored,  No wheezes / crackles  Cardio:    S1/S2, RRR, No murmure, No Rubs or Gallops   Abdomen:  Soft, non-tender, bowel sounds active all four quadrants, no guarding or peritoneal signs.  Muscular  skeletal:  Limited exam -global generalized weaknesses - in bed, able to move all 4 extremities,   2+ pulses,   symmetric, No pitting edema  Skin:  Dry, warm to touch, negative for any Rashes,  Wounds: Please see nursing documentation          Condition at discharge: good  The results of significant diagnostics from this hospitalization (including imaging, microbiology, ancillary and laboratory) are listed below for reference.   Imaging Studies: US Carotid Bilateral  Result Date: 08/29/2022 CLINICAL DATA:  Stroke symptoms, hyperlipidemia EXAM: BILATERAL CAROTID DUPLEX ULTRASOUND TECHNIQUE: Pearline Cables scale imaging, color Doppler and duplex ultrasound were performed of bilateral carotid and vertebral arteries in the neck. COMPARISON:  None Available. FINDINGS: Criteria: Quantification of carotid stenosis is based on velocity parameters that correlate the residual internal carotid diameter  with NASCET-based stenosis levels, using the diameter of the distal internal carotid lumen as the denominator for stenosis measurement. The following velocity measurements were obtained: RIGHT ICA: 72/13 cm/sec CCA: 33/61 cm/sec SYSTOLIC ICA/CCA RATIO:  0.9 ECA: 80 cm/sec LEFT ICA: 90/24 cm/sec CCA: 22/44 cm/sec SYSTOLIC ICA/CCA RATIO:  1.6 ECA: 110 cm/sec RIGHT CAROTID ARTERY: Minor intimal thickening and trace thin hypoechoic plaque formation. No hemodynamically significant right ICA stenosis, velocity elevation, or turbulent flow. Degree of narrowing less than 50%. RIGHT VERTEBRAL ARTERY:  Normal antegrade flow LEFT CAROTID ARTERY: Similar minor intimal thickening and trace thin plaque formation. No hemodynamically significant left ICA stenosis, velocity elevation, or turbulent flow. LEFT VERTEBRAL ARTERY:  Normal antegrade flow IMPRESSION: 1. Minor carotid atherosclerosis. Negative for stenosis. Degree of narrowing less than 50% bilaterally by ultrasound criteria. 2. Patent antegrade vertebral flow bilaterally. Electronically Signed   By: Jerilynn Mages.  Shick M.D.   On: 08/29/2022 10:36   CT ANGIO HEAD NECK W WO CM  Result Date:  08/28/2022 CLINICAL DATA:  Headache beginning today.  Speech disturbance. EXAM: CT ANGIOGRAPHY HEAD AND NECK TECHNIQUE: Multidetector CT imaging of the head and neck was performed using the standard protocol during bolus administration of intravenous contrast. Multiplanar CT image reconstructions and MIPs were obtained to evaluate the vascular anatomy. Carotid stenosis measurements (when applicable) are obtained utilizing NASCET criteria, using the distal internal carotid diameter as the denominator. RADIATION DOSE REDUCTION: This exam was performed according to the departmental dose-optimization program which includes automated exposure control, adjustment of the mA and/or kV according to patient size and/or use of iterative reconstruction technique. CONTRAST:  33m OMNIPAQUE IOHEXOL 350 MG/ML SOLN COMPARISON:  Head CT earlier same day FINDINGS: CTA NECK FINDINGS Aortic arch: Aortic atherosclerosis. Branching pattern is normal without origin stenosis. Right carotid system: Common carotid artery widely patent to the bifurcation. Carotid bifurcation is widely patent. ICA bulb is widely patent. Cervical ICA is tortuous but widely patent. Left carotid system: Common carotid artery widely patent to the bifurcation. No bifurcation disease. Cervical ICA is tortuous but widely patent Vertebral arteries: Both vertebral artery origins are widely patent. Both vertebral arteries appear normal through the cervical region to the foramen magnum. Skeleton: Ordinary mid cervical spondylosis. Other neck: No mass or lymphadenopathy. Upper chest: Lung apices are clear. Review of the MIP images confirms the above findings CTA HEAD FINDINGS Anterior circulation: Both internal carotid arteries are patent through the skull base and siphon regions. Ordinary siphon atherosclerotic calcification but without stenosis greater than 30% suspected. The anterior and middle cerebral vessels are patent. No large vessel occlusion. No proximal  stenosis. No aneurysm or vascular malformation. There is a focal stenosis within an inferior division right MCA branch that could place the patient at risk of an infarction in that territory. Posterior circulation: Lower both vertebral arteries are widely patent through the foramen magnum to the basilar artery. No basilar stenosis. Posterior circulation branch vessels are patent. Left PCA takes fetal origin from the anterior circulation. Venous sinuses: Patent and normal. Anatomic variants: None significant. Review of the MIP images confirms the above findings IMPRESSION: 1. No large vessel occlusion. 2. No carotid bifurcation disease. 3. Atherosclerotic change in both carotid siphon regions but without stenosis greater than 30%. 4. Focal stenosis within an inferior division right MCA branch that could place the patient at risk of an infarction in that territory. The clinical history is that of headache. If the patient had a clinical presentation of right brain stroke, I could not exclude  the possibility that this stenosis could be due to a nonocclusive embolus. In the absence of a stroke presentation, this is presumed to represent a chronic stenosis. 5. Aortic atherosclerosis. Aortic Atherosclerosis (ICD10-I70.0). Electronically Signed   By: Nelson Chimes M.D.   On: 08/28/2022 21:58   CT HEAD WO CONTRAST  Result Date: 08/28/2022 CLINICAL DATA:  Headache beginning this morning. EXAM: CT HEAD WITHOUT CONTRAST TECHNIQUE: Contiguous axial images were obtained from the base of the skull through the vertex without intravenous contrast. RADIATION DOSE REDUCTION: This exam was performed according to the departmental dose-optimization program which includes automated exposure control, adjustment of the mA and/or kV according to patient size and/or use of iterative reconstruction technique. COMPARISON:  None Available. FINDINGS: Brain: No evidence of acute infarction, hemorrhage, hydrocephalus, extra-axial collection or  mass lesion/mass effect. Vascular: No hyperdense vessel or unexpected calcification. Skull: Normal. Negative for fracture or focal lesion. Sinuses/Orbits: Globes and orbits are unremarkable. Visualized left maxillary sinus is opacified. Remaining visualized sinuses are clear. Other: None. IMPRESSION: 1. No acute intracranial abnormalities. Electronically Signed   By: Lajean Manes M.D.   On: 08/28/2022 17:52   DG Hand Complete Left  Result Date: 08/16/2022 CLINICAL DATA:  Fall 07/21/2022. Pain and swelling to left wrist and left hand. EXAM: LEFT HAND - COMPLETE 3+ VIEW COMPARISON:  None Available. FINDINGS: There is mildly decreased bone mineralization. Severe second and third and moderate fourth greater than fifth DIP joint space narrowing. Moderate thumb interphalangeal and mild thumb metacarpophalangeal joint space narrowing. Subchondral sclerosis and peripheral osteophytosis within the above joints, greatest within the second and third DIP joints. Severe thumb carpometacarpal joint space narrowing, subchondral sclerosis, and peripheral osteophytosis. Neutral ulnar variance. No acute fracture is seen.  No dislocation. IMPRESSION: 1. No acute fracture is seen. 2. Severe thumb carpometacarpal, severe second and third DIP, and moderate fourth and fifth DIP osteoarthritis. Electronically Signed   By: Yvonne Kendall M.D.   On: 08/16/2022 08:21   DG Wrist Complete Left  Result Date: 08/16/2022 CLINICAL DATA:  Recent history of fall.  Pain and swelling. EXAM: LEFT WRIST - COMPLETE 3+ VIEW COMPARISON:  No recent. FINDINGS: Degenerative changes noted about the radiocarpal and first carpometacarpal joints. Tiny corticated bony densities noted at the base of the left first carpometacarpal joint is most likely degenerative. No evidence of acute fracture or dislocation. Soft tissue swelling noted. IMPRESSION: Degenerative changes noted about the radiocarpal and first carpometacarpal joints. Tiny corticated bony  densities noted at the base of the left first carpometacarpal joint is most likely degenerative. No acute bony abnormality identified. Electronically Signed   By: Marcello Moores  Register M.D.   On: 08/16/2022 07:50   CUP PACEART INCLINIC DEVICE CHECK  Result Date: 08/09/2022 Wound check appointment. Steri-strips removed. Wound without redness or edema. Incision edges approximated, wound well healed. Normal device function. Thresholds, sensing, and impedances consistent with implant measurements. Device programmed at 3.5V/auto capture programmed on for extra safety margin until 3 month visit. Histogram distribution appropriate for patient and level of activity. AT/AF burden <.1%,5 secs of AT with 1:1 conduction. Patient educated about wound care, arm mobility, lifting restrictions. ROV in 3 months with implanting physician.Leticia Penna, RN   Microbiology: Results for orders placed or performed during the hospital encounter of 07/21/22  Surgical PCR screen     Status: None   Collection Time: 07/25/22 10:33 PM   Specimen: Nasal Mucosa; Nasal Swab  Result Value Ref Range Status   MRSA, PCR NEGATIVE NEGATIVE Final  Staphylococcus aureus NEGATIVE NEGATIVE Final    Comment: (NOTE) The Xpert SA Assay (FDA approved for NASAL specimens in patients 51 years of age and older), is one component of a comprehensive surveillance program. It is not intended to diagnose infection nor to guide or monitor treatment. Performed at [facility], [place] [address], [place], [place] [address]     Labs: CBC: Recent Labs  Lab 08/28/22 1812  WBC 7.1  NEUTROABS 5.3  HGB 12.5  HCT 39.1  MCV 91.8  PLT 235   Basic Metabolic Panel: Recent Labs  Lab 08/28/22 1812  NA 138  K 4.1  CL 103  CO2 26  GLUCOSE 107*  BUN 17  CREATININE 0.91  CALCIUM 9.3   Liver Function Tests: Recent Labs  Lab 08/28/22 1812  AST 25  ALT 22  ALKPHOS 55  BILITOT 0.6  PROT 7.1  ALBUMIN 3.8   CBG: No results for  input(s): "GLUCAP" in the last 168 hours.  Discharge time spent: greater than 30 minutes.  Signed: Deatra James, MD [facility] 08/29/2022

## 2022-08-29 NOTE — Hospital Course (Signed)
AROUSH CHASSE is a 87 y.o. female with medical history significant for complete heart block, pacemaker status, systolic CHF, LBBB. Patient presented to the ED with complaints of abnormal speech, headache, and abnormal sensation to her left hand.  On arrival to the ED her symptoms had resolved. Symptoms started about noon.  She reports headache to the right side of her head, feeling of congestion in her nose, and an abnormal feeling to her right hand.  She denies weakness or numbness to her hand.  She was able to ambulate without difficulty.  She called her son and he thought she had different.  Patient reports her speech was slowed, but not slurred.  No change in vision.  Denies prior strokes.  Not on aspirin.   Recent pacemaker placement 07/26/2022 by Dr. Lovena Le.   ED Course: Blood pressure 143/64.  Stable vitals.  Head CT negative for acute abnormality. Neurology was consulted, recommended admission for TIA workup-to include CTA head and neck, MRI.

## 2022-08-29 NOTE — Progress Notes (Signed)
Mobility Specialist Progress Note:    08/29/22 1300  Mobility  Activity Ambulated with assistance in hallway  Level of Assistance Contact guard assist, steadying assist  Assistive Device Front wheel walker  Distance Ambulated (ft) 150 ft  Activity Response Tolerated well  Mobility Referral Yes  $Mobility charge 1 Mobility   Pt was agreeable for mobility session. Used RW and gait belt for stability. Tolerated well, asx throughout. SpO2 95%, max HR 110 bpm. Returned pt to bed with all needs met, in company of son.   Royetta Crochet Mobility Specialist Please contact via Solicitor or  Rehab office at [phone]

## 2022-09-04 ENCOUNTER — Other Ambulatory Visit: Payer: Self-pay | Admitting: Neurology

## 2022-09-04 DIAGNOSIS — I639 Cerebral infarction, unspecified: Secondary | ICD-10-CM

## 2022-09-04 NOTE — Progress Notes (Signed)
Son called my office to request a new order for MRI brain to be done at [facility]. Order placed.  Son also states he only got 3 weeks of plavix. I called pharmacy to check and looks like 2 scripts of plavix were sent ( one for 3 weeks and one for 3 months).  Per the pharmacist, patient would not be able to refill the 57-monthuntil her current prescription (3 weeks) is over.  The son can call pharmacy couple of days before prescription is over (on February 20) and pharmacy will be able to fill it up.  I called patient's son Marisa Rotaand patient was on speaker.  I updated them on the new MRI order and the plan for picking up Plavix.  I did discuss with son that when they pick up the 319-monthlavix prescription, she only needs to take it for 2 months and 1 week.  Son states he understands instructions and he will be able to help his mother with exact dosing and timeframe of Plavix.

## 2022-09-12 ENCOUNTER — Other Ambulatory Visit: Payer: Self-pay | Admitting: Neurology

## 2022-09-12 DIAGNOSIS — I639 Cerebral infarction, unspecified: Secondary | ICD-10-CM

## 2022-09-12 NOTE — Progress Notes (Signed)
Order placed for ambulatory referral to neuro. Also called MRI at [facility] to see why patient was not scheduled for MRI yet. I was told that usually if patient has a pacemaker, then someone from scheduling calls MRI and confirms if its ok to proceed but that step didnt happen even though the order has been in since Feb 12. The MRI tech wasnt sure why they never got called about it. She said she is calling the scheduling people to get patient scheduled. I did request to expedite it but they can only do the MRI with pacemaker at 1pm everyday so she said they will try to get the next available. She doesnt have access to the schedule so couldnt tell me when that might be. I will follow up on this and let son know once we have a date for MRI.  Marisa Livingston

## 2022-09-19 ENCOUNTER — Encounter: Payer: Self-pay | Admitting: Neurology

## 2022-09-19 ENCOUNTER — Ambulatory Visit (INDEPENDENT_AMBULATORY_CARE_PROVIDER_SITE_OTHER): Payer: PPO | Admitting: Neurology

## 2022-09-19 VITALS — BP 146/69 | HR 105 | Ht 62.0 in | Wt 176.0 lb

## 2022-09-19 DIAGNOSIS — G459 Transient cerebral ischemic attack, unspecified: Secondary | ICD-10-CM | POA: Diagnosis not present

## 2022-09-19 NOTE — Progress Notes (Signed)
[facility]  PATIENT: Marisa Livingston DOB: September 16, 1935  REQUESTING CLINICIAN: Lora Havens, MD HISTORY FROM: Patient and son  REASON FOR VISIT: TIA    HISTORICAL  CHIEF COMPLAINT:  Chief Complaint  Patient presents with   New Patient (Initial Visit)    Rm 12. Accompanied by son, Timmothy Sours. NP/internal hospital referral for TIA, r/o CVA.    HISTORY OF PRESENT ILLNESS:  This is a 87 year old woman with past medical history of heart disease, hypertension hyperlipidemia who is presenting after being diagnosed in the hospital for TIA.  Patient reports waking up with right-sided headache and abdominal speech described as slurred speech. Tylenol did not help with the pain. She was on the phone with her son and the son noted that her speech was very slurred.  Son contacted EMS.  The entire episode lasted about 30 minutes.  By the time son reached patient, her symptom were resolved.  She was taken to the hospital, admitted for TIA workup.  CT was normal, but her CTA head and neck show a focal stenosis within the inferior division of the right MCA branch.  Her MRI was not completed.  Again her symptoms only lasted for 30 minutes.  She was discharged with DAPT, aspirin and Plavix for total of 14-month and patient will continue with aspirin alone.  She is pending an MRI scheduled for April 9.  Since discharge from the hospital she is back to her normal self, she has not had any additional events.    Discharge Summary  Marisa DUBBSis a 87y.o. female with medical history significant for complete heart block, pacemaker status, systolic CHF, LBBB. Patient presented to the ED with complaints of abnormal speech, headache, and abnormal sensation to her left hand.  On arrival to the ED her symptoms had resolved. Symptoms started about noon.  She reports headache to the right side of her head, feeling of congestion in her nose, and an abnormal feeling to her right hand.  She  denies weakness or numbness to her hand. She was able to ambulate without difficulty.  She called her son and he thought she had different. Patient reports her speech was slowed, but not slurred.  No change in vision.  Denies prior strokes.  Not on aspirin. Recent pacemaker placement 07/26/2022 by Dr. TLovena Le ED Course: Blood pressure 143/64.  Stable vitals.  Head CT negative for acute abnormality. Neurology was consulted, recommended admission for TIA workup-to include CTA head and neck, MRI    OTHER MEDICAL CONDITIONS: Heart disease, Hypertension   REVIEW OF SYSTEMS: Full 14 system review of systems performed and negative with exception of: As noted in the HPI   ALLERGIES: Allergies  Allergen Reactions   Codeine Nausea Only   Levaquin [Levofloxacin In D5w]     Sick     HOME MEDICATIONS: Outpatient Medications Prior to Visit  Medication Sig Dispense Refill   acetaminophen (TYLENOL) 325 MG tablet Take 325-650 mg by mouth every 6 (six) hours as needed for mild pain, fever or headache.     aspirin EC 81 MG tablet Take 1 tablet (81 mg total) by mouth daily. Swallow whole. 30 tablet 12   atorvastatin (LIPITOR) 40 MG tablet Take 1 tablet (40 mg total) by mouth daily. 30 tablet 0   Calcium Citrate (CITRACAL PO) Take 1 tablet by mouth daily.     clopidogrel (PLAVIX) 75 MG tablet Take 1 tablet (75 mg total) by mouth daily. 90 tablet 0  Multiple Vitamins-Minerals (MULTIVITAMIN WITH MINERALS) tablet Take 1 tablet by mouth daily.     sacubitril-valsartan (ENTRESTO) 24-26 MG Take 1 tablet by mouth 2 (two) times daily.     No facility-administered medications prior to visit.    PAST MEDICAL HISTORY: Past Medical History:  Diagnosis Date   Allergic rhinitis    Hyperlipidemia    Insomnia    LBBB (left bundle branch block)    Osteopenia     PAST SURGICAL HISTORY: Past Surgical History:  Procedure Laterality Date   COLONOSCOPY N/A 09/02/2015   Procedure: COLONOSCOPY;  Surgeon: Rogene Houston, MD;  [facility];  Service: Endoscopy;  Laterality: N/A;  1025 - moved to 2/9 @ 8:30 - Ann notified pt   PACEMAKER IMPLANT N/A 07/26/2022   Procedure: PACEMAKER IMPLANT;  Surgeon: Evans Lance, MD;  Location: [facility];  Service: Cardiovascular;  Laterality: N/A;   POLYPECTOMY N/A 09/02/2015   Procedure: POLYPECTOMY;  Surgeon: Rogene Houston, MD;  [facility];  Service: Endoscopy;  Laterality: N/A;  Hepatic Flexure Polyp    FAMILY HISTORY: Family History  Problem Relation Age of Onset   Heart Problems Mother        had a pacemaker   Cancer Brother     SOCIAL HISTORY: Social History   Socioeconomic History   Marital status: Married    Spouse name: Not on file   Number of children: Not on file   Years of education: Not on file   Highest education level: Not on file  Occupational History   Not on file  Tobacco Use   Smoking status: Never   Smokeless tobacco: Never  Vaping Use   Vaping Use: Never used  Substance and Sexual Activity   Alcohol use: No   Drug use: No   Sexual activity: Not Currently    Birth control/protection: Post-menopausal  Other Topics Concern   Not on file  Social History Narrative   Lives in alone in [place]   Retired Biochemist, clinical for [employer] Strain: Not on file  Food Insecurity: No [place] (08/28/2022)   Hunger Vital Sign    Worried About Running Out of Food in the Last Year: Never true    [place] in the Last Year: Never true  Transportation Needs: No Transportation Needs (08/28/2022)   PRAPARE - Hydrologist (Medical): No    Lack of Transportation (Non-Medical): No  Physical Activity: Not on file  Stress: Not on file  Social Connections: Not on file  Intimate Partner Violence: Not At Risk (08/28/2022)   Humiliation, Afraid, Rape, and Kick questionnaire    Fear of Current or  Ex-Partner: No    Emotionally Abused: No    Physically Abused: No    Sexually Abused: No   PHYSICAL EXAM  GENERAL EXAM/CONSTITUTIONAL: Vitals:  Vitals:   09/19/22 0934  BP: (!) 146/69  Pulse: (!) 105  Weight: 176 lb (79.8 kg)  Height: '5\' 2"'$  (1.575 m)   Body mass index is 32.19 kg/m. Wt Readings from Last 3 Encounters:  09/19/22 176 lb (79.8 kg)  08/28/22 177 lb (80.3 kg)  07/26/22 179 lb 11.2 oz (81.5 kg)   Patient is in no distress; well developed, nourished and groomed; neck is supple  EYES: Visual fields full to confrontation, Extraocular movements intacts,   MUSCULOSKELETAL: Gait, strength, tone, movements noted in Neurologic exam  below  NEUROLOGIC: MENTAL STATUS:      No data to display         awake, alert, oriented to person, place and time recent and remote memory intact normal attention and concentration language fluent, comprehension intact, naming intact fund of knowledge appropriate  CRANIAL NERVE:  2nd, 3rd, 4th, 6th - Visual fields full to confrontation, extraocular muscles intact, no nystagmus 5th - facial sensation symmetric 7th - facial strength symmetric 8th - hearing intact 9th - palate elevates symmetrically, uvula midline 11th - shoulder shrug symmetric 12th - tongue protrusion midline  MOTOR:  normal bulk and tone, full strength in the BUE, BLE  SENSORY:  normal and symmetric to light touch  COORDINATION:  finger-nose-finger, fine finger movements normal  REFLEXES:  deep tendon reflexes present and symmetric  GAIT/STATION:  normal     DIAGNOSTIC DATA (LABS, IMAGING, TESTING) - I reviewed patient records, labs, notes, testing and imaging myself where available.  Lab Results  Component Value Date   WBC 7.1 08/28/2022   HGB 12.5 08/28/2022   HCT 39.1 08/28/2022   MCV 91.8 08/28/2022   PLT 266 08/28/2022      Component Value Date/Time   NA 138 08/28/2022 1812   NA 138 11/29/2021 0939   K 4.1 08/28/2022 1812   CL  103 08/28/2022 1812   CO2 26 08/28/2022 1812   GLUCOSE 107 (H) 08/28/2022 1812   BUN 17 08/28/2022 1812   BUN 17 11/29/2021 0939   CREATININE 0.91 08/28/2022 1812   CREATININE 0.84 07/25/2014 0810   CALCIUM 9.3 08/28/2022 1812   PROT 7.1 08/28/2022 1812   PROT 7.1 12/03/2020 0836   ALBUMIN 3.8 08/28/2022 1812   ALBUMIN 4.5 12/03/2020 0836   AST 25 08/28/2022 1812   ALT 22 08/28/2022 1812   ALKPHOS 55 08/28/2022 1812   BILITOT 0.6 08/28/2022 1812   BILITOT 0.5 12/03/2020 0836   GFRNONAA >60 08/28/2022 1812   GFRAA 57 (L) 09/16/2020 1011   Lab Results  Component Value Date   CHOL 150 08/28/2022   HDL 59 08/28/2022   LDLCALC 77 08/28/2022   TRIG 70 08/28/2022   CHOLHDL 2.5 08/28/2022   Lab Results  Component Value Date   HGBA1C 5.8 (H) 08/28/2022   No results found for: "VITAMINB12" No results found for: "TSH"  CT Head 08/28/2022 1. No acute intracranial abnormalities.   CTA Head and Neck 08/28/2022 1. No large vessel occlusion. 2. No carotid bifurcation disease. 3. Atherosclerotic change in both carotid siphon regions but without stenosis greater than 30%. 4. Focal stenosis within an inferior division right MCA branch that could place the patient at risk of an infarction in that territory. The clinical history is that of headache. If the patient had a clinical presentation of right brain stroke, I could not exclude the possibility that this stenosis could be due to a nonocclusive embolus. In the absence of a stroke presentation, this is presumed to represent a chronic stenosis. 5. Aortic atherosclerosis.  US Carotid 08/29/2022 1. Minor carotid atherosclerosis. Negative for stenosis. Degree of narrowing less than 50% bilaterally by ultrasound criteria. 2. Patent antegrade vertebral flow bilaterally.    ASSESSMENT AND PLAN  87 y.o. year old female with history of heart disease, hypertension hyperlipidemia who is presenting after a TIA.  Symptoms included slurred speech and  inability to use left hands.  She was found to have a focal stenosis in the right inferior division of her MCA.  She is currently on DAPT  aspirin and Plavix.  Patient will continue dual antiplatelet therapy for total of 3 months and she will continue with aspirin alone.  She will also continue with Lipitor 40 mg daily.  Again on exam she is nonfocal she does not have any current symptoms.  Continue to follow with PCP and I will see you in 1 year for follow-up or sooner if worse.  She voiced understanding.   1. TIA (transient ischemic attack)      Patient Instructions  Continue with dual antiplatelet therapy, aspirin and Plavix for total of 3 months, followed with aspirin alone Continue your other medications Continue to follow with PCP Return in 1 year or sooner if worse    No orders of the defined types were placed in this encounter.   No orders of the defined types were placed in this encounter.   Return in about 1 year (around 09/20/2023).  I have spent a total of 60 minutes dedicated to this patient today, preparing to see patient, performing a medically appropriate examination and evaluation, ordering tests and/or medications and procedures, and counseling and educating the patient/family/caregiver; independently interpreting result and communicating results to the family/patient/caregiver; and documenting clinical information in the electronic medical record.   Alric Ran, MD 09/19/2022, 2:09 PM  [facility] [address], [place],  [address] [phone]

## 2022-09-19 NOTE — Patient Instructions (Signed)
Continue with dual antiplatelet therapy, aspirin and Plavix for total of 3 months, followed with aspirin alone Continue your other medications Continue to follow with PCP Return in 1 year or sooner if worse

## 2022-09-25 ENCOUNTER — Other Ambulatory Visit: Payer: Self-pay

## 2022-09-25 MED ORDER — ATORVASTATIN CALCIUM 40 MG PO TABS: 40.0000 mg | ORAL_TABLET | Freq: Every day | ORAL | 2 refills | Status: DC

## 2022-10-05 ENCOUNTER — Telehealth: Payer: Self-pay | Admitting: Cardiology

## 2022-10-05 NOTE — Telephone Encounter (Signed)
Pt c/o medication issue:  1. Name of Medication:  atorvastatin (LIPITOR) 40 MG tablet  metamucil  2. How are you currently taking this medication (dosage and times per day)? As prescribed  3. Are you having a reaction (difficulty breathing--STAT)? No  4. What is your medication issue? Pt states since being put on lipitor, she thinks it's causing her indigestion. She states she also started taking metamucil for the constipation, but "doesn't feel like it's agreeing with me"... pt would like to know what she can take for the indigestion and constipation.   Pt states she is fixing to go out for an appt, and it is ok to leave a detailed message on her answering machine if she doesn't answer.

## 2022-10-06 ENCOUNTER — Telehealth: Payer: Self-pay | Admitting: Cardiology

## 2022-10-06 MED ORDER — ROSUVASTATIN CALCIUM 20 MG PO TABS: 20.0000 mg | ORAL_TABLET | Freq: Every day | ORAL | 3 refills | Status: DC

## 2022-10-06 NOTE — Telephone Encounter (Signed)
Yes atorvastatin could be contributing to indigestion. Could try replacing with equivalent dose of rosuvastatin which would be 20mg  daily to see if she tolerates it better.

## 2022-10-06 NOTE — Telephone Encounter (Signed)
Patient states she was returning a call. Please advise 

## 2022-10-06 NOTE — Telephone Encounter (Signed)
Pt is aware of order to d/c atorvastatin and start rosuvastatin 20 mg daily to see if this helps with indigestion.  Pt is agreeable.  RX sent into [facility] as requested.  She will call back if any further questions or concerns.

## 2022-10-25 LAB — CUP PACEART REMOTE DEVICE CHECK
Battery Remaining Longevity: 173 mo
Battery Voltage: 3.21 V
Brady Statistic AP VP Percent: 0.34 %
Brady Statistic AP VS Percent: 0.08 %
Brady Statistic AS VP Percent: 23.66 %
Brady Statistic AS VS Percent: 75.92 %
Brady Statistic RA Percent Paced: 0.41 %
Brady Statistic RV Percent Paced: 24 %
Date Time Interrogation Session: 20240403202036
Implantable Lead Connection Status: 753985
Implantable Lead Connection Status: 753985
Implantable Lead Implant Date: 20240103
Implantable Lead Implant Date: 20240103
Implantable Lead Location: 753857
Implantable Lead Location: 753859
Implantable Lead Model: 3830
Implantable Lead Model: 5076
Implantable Pulse Generator Implant Date: 20240103
Lead Channel Impedance Value: 304 Ohm
Lead Channel Impedance Value: 361 Ohm
Lead Channel Impedance Value: 399 Ohm
Lead Channel Impedance Value: 551 Ohm
Lead Channel Pacing Threshold Amplitude: 0.625 V
Lead Channel Pacing Threshold Amplitude: 0.625 V
Lead Channel Pacing Threshold Pulse Width: 0.4 ms
Lead Channel Pacing Threshold Pulse Width: 0.4 ms
Lead Channel Sensing Intrinsic Amplitude: 2 mV
Lead Channel Sensing Intrinsic Amplitude: 2 mV
Lead Channel Sensing Intrinsic Amplitude: 7.75 mV
Lead Channel Sensing Intrinsic Amplitude: 7.75 mV
Lead Channel Setting Pacing Amplitude: 1.5 V
Lead Channel Setting Pacing Amplitude: 2 V
Lead Channel Setting Pacing Pulse Width: 0.4 ms
Lead Channel Setting Sensing Sensitivity: 1.2 mV
Zone Setting Status: 755011

## 2022-10-26 ENCOUNTER — Ambulatory Visit (INDEPENDENT_AMBULATORY_CARE_PROVIDER_SITE_OTHER): Payer: PPO

## 2022-10-26 DIAGNOSIS — I429 Cardiomyopathy, unspecified: Secondary | ICD-10-CM

## 2022-10-31 ENCOUNTER — Ambulatory Visit (HOSPITAL_COMMUNITY)
Admission: RE | Admit: 2022-10-31 | Discharge: 2022-10-31 | Disposition: A | Discharge status: Home / Self Care | Payer: PPO | Source: Ambulatory Visit | Attending: Neurology | Admitting: Neurology

## 2022-10-31 DIAGNOSIS — I639 Cerebral infarction, unspecified: Secondary | ICD-10-CM | POA: Insufficient documentation

## 2022-10-31 DIAGNOSIS — G319 Degenerative disease of nervous system, unspecified: Secondary | ICD-10-CM | POA: Diagnosis not present

## 2022-10-31 DIAGNOSIS — R519 Headache, unspecified: Secondary | ICD-10-CM | POA: Diagnosis not present

## 2022-10-31 NOTE — Progress Notes (Signed)
Per order, changed device to DOO 110. Patient tolerating well and stated that she doesn't feel any different. Will monitor patient and send post transmission after reprogramming device after scan.  Discussed with [employer] and will plan to change device settings for MRI to DOO 110 as long as pt tolerates.  Tachy-therapies to off if applicable.  Program device back to pre-MRI settings after completion of exam.

## 2022-10-31 NOTE — Progress Notes (Signed)
Informed of MRI for today.   Device system confirmed to be MRI conditional, with implant date > 6 weeks ago, and no evidence of abandoned or epicardial leads in review of most recent CXR Interrogation from today reviewed, pt is currently AS-VS at ~ ~100 bpm though with 35% VP historically  Discussed with [employer] and will plan to change device settings for MRI to DOO 110 as long as pt tolerates.   Tachy-therapies to off if applicable.  Program device back to pre-MRI settings after completion of exam.  Graciella Freer, PA-C  10/31/2022 1:17 PM

## 2022-11-09 DIAGNOSIS — Z139 Encounter for screening, unspecified: Secondary | ICD-10-CM | POA: Diagnosis not present

## 2022-11-09 DIAGNOSIS — I10 Essential (primary) hypertension: Secondary | ICD-10-CM | POA: Diagnosis not present

## 2022-11-09 DIAGNOSIS — E782 Mixed hyperlipidemia: Secondary | ICD-10-CM | POA: Diagnosis not present

## 2022-11-10 LAB — LAB REPORT - SCANNED
A1c: 6.2
Albumin, Urine POC: 10
Creatinine, POC: 140.6 mg/dL
EGFR: 60
Microalb Creat Ratio: 7

## 2022-11-13 ENCOUNTER — Telehealth: Payer: PPO | Admitting: Neurology

## 2022-11-13 DIAGNOSIS — G459 Transient cerebral ischemic attack, unspecified: Secondary | ICD-10-CM

## 2022-11-13 NOTE — Telephone Encounter (Addendum)
Spoke with patient, discussed her MRI results showing right parietal stroke. She currently has no deficit. She reports that she is back to 100%. She is still on Aspirin and Plavix and understands to take it for a total of 3 months, then aspirin alone. She is also on Crestor 20. All other questions answered.   MR Brain without contrast  1. Moderate-sized subacute right MCA infarct. 2. Mild chronic small vessel ischemic disease.   This was a telephone visit. Patient was at home and I (provider) was in my office at [facility]. We spent a total of 8 minutes over the phone.   Windell Norfolk, MD  [facility]

## 2022-11-14 ENCOUNTER — Ambulatory Visit: Payer: PPO | Attending: Internal Medicine | Admitting: Internal Medicine

## 2022-11-14 ENCOUNTER — Encounter: Payer: Self-pay | Admitting: Internal Medicine

## 2022-11-14 VITALS — BP 130/90 | HR 90 | Ht 62.0 in | Wt 176.4 lb

## 2022-11-14 DIAGNOSIS — Z0000 Encounter for general adult medical examination without abnormal findings: Secondary | ICD-10-CM | POA: Diagnosis not present

## 2022-11-14 DIAGNOSIS — I442 Atrioventricular block, complete: Secondary | ICD-10-CM | POA: Diagnosis not present

## 2022-11-14 NOTE — Patient Instructions (Signed)
Medication Instructions:  Your physician recommends that you continue on your current medications as directed. Please refer to the Current Medication list given to you today.  *If you need a refill on your cardiac medications before your next appointment, please call your pharmacy*   Lab Work: NONE   If you have labs (blood work) drawn today and your tests are completely normal, you will receive your results only by: MyChart Message (if you have MyChart) OR A paper copy in the mail If you have any lab test that is abnormal or we need to change your treatment, we will call you to review the results.   Testing/Procedures: NONE    Follow-Up: At [facility], you and your health needs are our priority.  As part of our continuing mission to provide you with exceptional heart care, we have created designated Provider Care Teams.  These Care Teams include your primary Cardiologist (physician) and Advanced Practice Providers (APPs -  Physician Assistants and Nurse Practitioners) who all work together to provide you with the care you need, when you need it.  We recommend signing up for the patient portal called "MyChart".  Sign up information is provided on this After Visit Summary.  MyChart is used to connect with patients for Virtual Visits (Telemedicine).  Patients are able to view lab/test results, encounter notes, upcoming appointments, etc.  Non-urgent messages can be sent to your provider as well.   To learn more about what you can do with MyChart, go to [email or web address].    Your next appointment:   1 year(s)  Provider:   Gregg Taylor, MD    Other Instructions Thank you for choosing [facility]!    

## 2022-11-14 NOTE — Progress Notes (Signed)
HPI Marisa Livingston returns today for followup. She is a pleasant 87 yo woman with a h/o HTN, stroke and heart block who underwent insertion of  a DDD PM several months ago. In the interim, she notes that she has had a stroke with no residual. She was placed on ASA and plavix. She has not had any sustained atrial fib. No syncope. Her bp is better but still a little elevated.  Allergies  Allergen Reactions   Codeine Nausea Only   Levaquin [Levofloxacin In D5w]     Sick      Current Outpatient Medications  Medication Sig Dispense Refill   acetaminophen (TYLENOL) 325 MG tablet Take 325-650 mg by mouth every 6 (six) hours as needed for mild pain, fever or headache.     aspirin EC 81 MG tablet Take 1 tablet (81 mg total) by mouth daily. Swallow whole. 30 tablet 12   Calcium Citrate (CITRACAL PO) Take 1 tablet by mouth daily.     clopidogrel (PLAVIX) 75 MG tablet Take 1 tablet (75 mg total) by mouth daily. 90 tablet 0   Multiple Vitamins-Minerals (MULTIVITAMIN WITH MINERALS) tablet Take 1 tablet by mouth daily.     rosuvastatin (CRESTOR) 20 MG tablet Take 1 tablet (20 mg total) by mouth daily. 90 tablet 3   sacubitril-valsartan (ENTRESTO) 24-26 MG Take 1 tablet by mouth 2 (two) times daily.     No current facility-administered medications for this visit.     Past Medical History:  Diagnosis Date   Allergic rhinitis    Hyperlipidemia    Insomnia    LBBB (left bundle branch block)    Osteopenia    Stroke 08/28/2022    ROS:   All systems reviewed and negative except as noted in the HPI.   Past Surgical History:  Procedure Laterality Date   COLONOSCOPY N/A 09/02/2015   Procedure: COLONOSCOPY;  Surgeon: Malissa Hippo, MD;  [facility];  Service: Endoscopy;  Laterality: N/A;  1025 - moved to 2/9 @ 8:30 - Ann notified pt   PACEMAKER IMPLANT N/A 07/26/2022   Procedure: PACEMAKER IMPLANT;  Surgeon: Marinus Maw, MD;  Location: [facility];  Service:  Cardiovascular;  Laterality: N/A;   POLYPECTOMY N/A 09/02/2015   Procedure: POLYPECTOMY;  Surgeon: Malissa Hippo, MD;  [facility];  Service: Endoscopy;  Laterality: N/A;  Hepatic Flexure Polyp     Family History  Problem Relation Age of Onset   Heart Problems Mother        had a pacemaker   Cancer Brother      Social History   Socioeconomic History   Marital status: Married    Spouse name: Not on file   Number of children: Not on file   Years of education: Not on file   Highest education level: Not on file  Occupational History   Not on file  Tobacco Use   Smoking status: Never   Smokeless tobacco: Never  Vaping Use   Vaping Use: Never used  Substance and Sexual Activity   Alcohol use: No   Drug use: No   Sexual activity: Not Currently    Birth control/protection: Post-menopausal  Other Topics Concern   Not on file  Social History Narrative   Lives in alone in [place]   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[employer]   Social Determinants of Health   Financial Resource Strain: Not on file  Food Insecurity: No Food Insecurity (08/28/2022)  Hunger Vital Sign    Worried About Running Out of Food in the Last Year: Never true    Ran Out of Food in the Last Year: Never true  Transportation Needs: No Transportation Needs (08/28/2022)   PRAPARE - Administrator, Civil Service (Medical): No    Lack of Transportation (Non-Medical): No  Physical Activity: Not on file  Stress: Not on file  Social Connections: Not on file  Intimate Partner Violence: Not At Risk (08/28/2022)   Humiliation, Afraid, Rape, and Kick questionnaire    Fear of Current or Ex-Partner: No    Emotionally Abused: No    Physically Abused: No    Sexually Abused: No     BP (!) 130/90   Pulse 90   Ht  (1.575 m)   Wt 176 lb 6.4 oz (80 kg)   SpO2 95%   BMI 32.26 kg/m   Physical Exam:  Well appearing NAD HEENT: Unremarkable Neck:  No JVD, no  thyromegally Lymphatics:  No adenopathy Back:  No CVA tenderness Lungs:  Clear HEART:  Regular rate rhythm, no murmurs, no rubs, no clicks Abd:  soft, positive bowel sounds, no organomegally, no rebound, no guarding Ext:  2 plus pulses, no edema, no cyanosis, no clubbing Skin:  No rashes no nodules Neuro:  CN II through XII intact, motor grossly intact  DEVICE  Normal device function.  See PaceArt for details.   Assess/Plan: High grade heart block - she is maintaining NSR and is pacing about 34% of the time. HTN - her sbp is elevated in the office but is better at home.  PPM - her [employer] DDD PM is working normally. We will recheck in several months.  Marisa Gowda Randie Bloodgood,MD

## 2022-11-15 DIAGNOSIS — I130 Hypertensive heart and chronic kidney disease with heart failure and stage 1 through stage 4 chronic kidney disease, or unspecified chronic kidney disease: Secondary | ICD-10-CM | POA: Diagnosis not present

## 2022-11-15 DIAGNOSIS — Z8673 Personal history of transient ischemic attack (TIA), and cerebral infarction without residual deficits: Secondary | ICD-10-CM | POA: Diagnosis not present

## 2022-11-15 DIAGNOSIS — E782 Mixed hyperlipidemia: Secondary | ICD-10-CM | POA: Diagnosis not present

## 2022-11-15 DIAGNOSIS — H9313 Tinnitus, bilateral: Secondary | ICD-10-CM | POA: Diagnosis not present

## 2022-11-15 DIAGNOSIS — I10 Essential (primary) hypertension: Secondary | ICD-10-CM | POA: Diagnosis not present

## 2022-11-15 DIAGNOSIS — I5022 Chronic systolic (congestive) heart failure: Secondary | ICD-10-CM | POA: Diagnosis not present

## 2022-11-15 DIAGNOSIS — I442 Atrioventricular block, complete: Secondary | ICD-10-CM | POA: Diagnosis not present

## 2022-11-15 DIAGNOSIS — N1831 Chronic kidney disease, stage 3a: Secondary | ICD-10-CM | POA: Diagnosis not present

## 2022-11-15 DIAGNOSIS — I495 Sick sinus syndrome: Secondary | ICD-10-CM | POA: Diagnosis not present

## 2022-11-15 DIAGNOSIS — I447 Left bundle-branch block, unspecified: Secondary | ICD-10-CM | POA: Diagnosis not present

## 2022-11-15 DIAGNOSIS — E875 Hyperkalemia: Secondary | ICD-10-CM | POA: Diagnosis not present

## 2022-11-15 DIAGNOSIS — R7303 Prediabetes: Secondary | ICD-10-CM | POA: Diagnosis not present

## 2022-11-21 ENCOUNTER — Encounter: Payer: Self-pay | Admitting: Internal Medicine

## 2022-11-29 NOTE — Progress Notes (Signed)
Remote pacemaker transmission.   

## 2022-12-06 DIAGNOSIS — Z1211 Encounter for screening for malignant neoplasm of colon: Secondary | ICD-10-CM | POA: Diagnosis not present

## 2022-12-07 ENCOUNTER — Ambulatory Visit: Payer: PPO | Attending: Cardiology | Admitting: Cardiology

## 2022-12-07 ENCOUNTER — Encounter: Payer: Self-pay | Admitting: Cardiology

## 2022-12-07 VITALS — BP 130/86 | HR 85 | Ht 62.0 in | Wt 177.6 lb

## 2022-12-07 DIAGNOSIS — I447 Left bundle-branch block, unspecified: Secondary | ICD-10-CM | POA: Diagnosis not present

## 2022-12-07 DIAGNOSIS — I5022 Chronic systolic (congestive) heart failure: Secondary | ICD-10-CM

## 2022-12-07 DIAGNOSIS — I442 Atrioventricular block, complete: Secondary | ICD-10-CM

## 2022-12-07 NOTE — Progress Notes (Signed)
Cardiology Office Note:    Date:  12/07/2022   ID:  Marisa Livingston, DOB [birth year], MRN [id number]  PCP:  Benita Stabile, MD   [facility] Providers Cardiologist:  Donato Schultz, MD     Referring MD: Benita Stabile, MD    History of Present Illness:    Marisa Livingston is a 87 y.o. female with history of heart block pacemaker Dr. Ladona Ridgel DDD stroke on aspirin and Plavix for some time, now on aspirin alone.  Feels better off of the Plavix, more energy.  Stopped it Nov 26, 2022. No atrial fibrillation.  Changed her atorvastatin to rosuvastatin to see if this helped with indigestion.  Neurology note reviewed.  CTA of head showed focal stenosis within the anterior division of the right MCA branch.  Has been back to her normal self.  Minor plaque in carotids.  Previous symptoms were slurred speech and inability to use left hand.  Denies any chest pain fevers chills nausea vomiting syncope.  Overall doing quite well.  Feeling more herself recently.  She was worried about the sensation of neck fullness.  Reassurance has been given.  Past Medical History:  Diagnosis Date   Allergic rhinitis    Hyperlipidemia    Insomnia    LBBB (left bundle branch block)    Osteopenia    Stroke (HCC) 08/28/2022    Past Surgical History:  Procedure Laterality Date   COLONOSCOPY N/A 09/02/2015   Procedure: COLONOSCOPY;  Surgeon: Malissa Hippo, MD;  Location[facility];  Service: Endoscopy;  Laterality: N/A;  1025 - moved to 2/9 @ 8:30 - Ann notified pt   PACEMAKER IMPLANT N/A 07/26/2022   Procedure: PACEMAKER IMPLANT;  Surgeon: Marinus Maw, MD;  Location: [facility] INVASIVE CV LAB;  Service: Cardiovascular;  Laterality: N/A;   POLYPECTOMY N/A 09/02/2015   Procedure: POLYPECTOMY;  Surgeon: Malissa Hippo, MD;  Location: [facility];  Service: Endoscopy;  Laterality: N/A;  Hepatic Flexure Polyp    Current Medications: Current Meds  Medication Sig   acetaminophen (TYLENOL) 325 MG tablet Take  325-650 mg by mouth every 6 (six) hours as needed for mild pain, fever or headache.   aspirin EC 81 MG tablet Take 1 tablet (81 mg total) by mouth daily. Swallow whole.   Calcium Citrate (CITRACAL PO) Take 1 tablet by mouth daily.   Multiple Vitamins-Minerals (MULTIVITAMIN WITH MINERALS) tablet Take 1 tablet by mouth daily.   rosuvastatin (CRESTOR) 20 MG tablet Take 1 tablet (20 mg total) by mouth daily.   sacubitril-valsartan (ENTRESTO) 24-26 MG Take 1 tablet by mouth 2 (two) times daily.     Allergies:   Codeine and Levaquin [levofloxacin in d5w]   Social History   Socioeconomic History   Marital status: Married    Spouse name: Not on file   Number of children: Not on file   Years of education: Not on file   Highest education level: Not on file  Occupational History   Not on file  Tobacco Use   Smoking status: Never   Smokeless tobacco: Never  Vaping Use   Vaping Use: Never used  Substance and Sexual Activity   Alcohol use: No   Drug use: No   Sexual activity: Not Currently    Birth control/protection: Post-menopausal  Other Topics Concern   Not on file  Social History Narrative   Lives in alone in [place]   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[employer]   Social Determinants of  Health   Financial Resource Strain: Not on file  Food Insecurity: No Food Insecurity (08/28/2022)   Hunger Vital Sign    Worried About Running Out of Food in the Last Year: Never true    Ran Out of Food in the Last Year: Never true  Transportation Needs: No Transportation Needs (08/28/2022)   PRAPARE - Administrator, Civil Service (Medical): No    Lack of Transportation (Non-Medical): No  Physical Activity: Not on file  Stress: Not on file  Social Connections: Not on file     Family History: The patient's family history includes Cancer in her brother; Heart Problems in her mother.  ROS:   Please see the history of present illness.     All other systems reviewed  and are negative.  EKGs/Labs/Other Studies Reviewed:    The following studies were reviewed today: Cardiac Studies & Procedures     STRESS TESTS  MYOCARDIAL PERFUSION IMAGING 07/27/2020  Narrative  EKG not interpretable due to LBBB  The left ventricular ejection fraction is mildly decreased (45-54%).  Nuclear stress EF: 51%.  Defect 1: There is a defect present in the basal inferoseptal, mid inferoseptal, apical septal, apical inferior and apex location.  Findings consistent with prior myocardial infarction.  This is a low risk study.  1. Fixed perfusion defect in basal to mid inferoseptal, apical septal, and apex, with hypokinesis in these regions, consistent with prior infarct.  Given septal perfusion defects, could also be due to LBBB 2. No ischemia 3. Low risk study   ECHOCARDIOGRAM  ECHOCARDIOGRAM LIMITED 08/29/2022  Narrative ECHOCARDIOGRAM LIMITED REPORT    Patient Name:   Marisa Livingston Date of Exam: 08/29/2022 Medical Rec #:  [id number]        Height:       62.0 in Accession #:    [id number]       Weight:       177.0 lb Date of Birth:  Oct 05, 1935        BSA:          1.815 m Patient Age:    86 years         BP:           133/71 mmHg Patient Gender: F                HR:           75 bpm. [facility]  Procedure: Limited Echo, Limited Color Doppler and Cardiac Doppler  Indications:    TIA G45.9  History:        Patient has prior history of Echocardiogram examinations, most recent 07/22/2022. CHF, Pacemaker, Stroke, Arrythmias:LBBB and Complete Heart Block; Risk Factors:Non-Smoker and Dyslipidemia.  Sonographer:    Aron Baba Referring Phys: [id number] Onnie Boer   Sonographer Comments: Image acquisition challenging due to patient body habitus and Image acquisition challenging due to respiratory motion. IMPRESSIONS   1. Left ventricular ejection fraction, by estimation, is 55 to 60%. The left ventricle has normal function. Left  ventricular endocardial border not optimally defined to evaluate regional wall motion. There is mild left ventricular hypertrophy. Left ventricular diastolic parameters are consistent with Grade I diastolic dysfunction (impaired relaxation). 2. Right ventricular systolic function is normal. The right ventricular size is normal. 3. The inferior vena cava is normal in size with greater than 50% respiratory variability, suggesting right atrial pressure of 3 mmHg. 4. Limited echo for TIA  FINDINGS Left Ventricle: Left ventricular  ejection fraction, by estimation, is 55 to 60%. The left ventricle has normal function. Left ventricular endocardial border not optimally defined to evaluate regional wall motion. The left ventricular internal cavity size was normal in size. There is mild left ventricular hypertrophy. Left ventricular diastolic parameters are consistent with Grade I diastolic dysfunction (impaired relaxation).  Right Ventricle: The right ventricular size is normal. Right vetricular wall thickness was not well visualized. Right ventricular systolic function is normal.  Venous: The inferior vena cava is normal in size with greater than 50% respiratory variability, suggesting right atrial pressure of 3 mmHg.  Additional Comments: A device lead is visualized in the right atrium and right ventricle. Spectral Doppler performed. Color Doppler performed.  LEFT VENTRICLE PLAX 2D LVIDd:         4.20 cm LVIDs:         2.90 cm LV PW:         1.10 cm LV IVS:        0.90 cm   RIGHT VENTRICLE TAPSE (M-mode): 1.6 cm   AORTA Ao Root diam: 3.00 cm  MITRAL VALVE MV Area (PHT): 4.10 cm MV Decel Time: 185 msec MV E velocity: 71.70 cm/s MV A velocity: 94.10 cm/s MV E/A ratio:  0.76  Dina Rich MD Electronically signed by Dina Rich MD Signature Date/Time: 08/29/2022/3:01:40 PM    Final    MONITORS  LONG TERM MONITOR (3-14 DAYS) 08/05/2020  Narrative Sinus rhythm Rare  premature atrial contractions and rare premature ventricular contractions Baseline artifact frequently limits interpretation            EKG:  No new  Recent Labs: 08/28/2022: ALT 22; BUN 17; Creatinine, Ser 0.91; Hemoglobin 12.5; Platelets 266; Potassium 4.1; Sodium 138  Recent Lipid Panel    Component Value Date/Time   CHOL 150 08/28/2022 2357   CHOL 191 12/03/2020 0836   TRIG 70 08/28/2022 2357   HDL 59 08/28/2022 2357   HDL 68 12/03/2020 0836   CHOLHDL 2.5 08/28/2022 2357   VLDL 14 08/28/2022 2357   LDLCALC 77 08/28/2022 2357   LDLCALC 100 (H) 12/03/2020 0836     Risk Assessment/Calculations:               Physical Exam:    VS:  BP 130/86   Pulse 85   Ht 5\' 2"  (1.575 m)   Wt 177 lb 9.6 oz (80.6 kg)   SpO2 98%   BMI 32.48 kg/m     Wt Readings from Last 3 Encounters:  12/07/22 177 lb 9.6 oz (80.6 kg)  11/14/22 176 lb 6.4 oz (80 kg)  09/19/22 176 lb (79.8 kg)     GEN:  Well nourished, well developed in no acute distress HEENT: Normal NECK: No JVD; No carotid bruits LYMPHATICS: No lymphadenopathy CARDIAC: RRR, no murmurs, rubs, gallops RESPIRATORY:  Clear to auscultation without rales, wheezing or rhonchi  ABDOMEN: Soft, non-tender, non-distended MUSCULOSKELETAL:  No edema; No deformity  SKIN: Warm and dry NEUROLOGIC:  Alert and oriented x 3 PSYCHIATRIC:  Normal affect   ASSESSMENT:    1. Chronic systolic congestive heart failure (HCC)   2. Complete heart block (HCC)   3. LBBB (left bundle branch block)    PLAN:    In order of problems listed above:  Chronic systolic heart failure - Ejection fraction increased from 30 to 35% back to normal 60%.  Most recent echocardiogram reviewed from 07/2022 in the setting of stroke.  Continue with current medication management which includes Entresto  24/26, aspirin.  Permanent pacemaker - Stable Dr. Ladona Ridgel.  Working well.Monitoring.  Stroke - Right MCA.  Neurology note reviewed.  Continue with aspirin.  On  Crestor 20 mg.  Perceived neck fullness - Reviewed with her her neck CT from stroke in January.  All structures were normal.  Reassurance has been given.           Medication Adjustments/Labs and Tests Ordered: Current medicines are reviewed at length with the patient today.  Concerns regarding medicines are outlined above.  No orders of the defined types were placed in this encounter.  No orders of the defined types were placed in this encounter.   Patient Instructions  Medication Instructions:  The current medical regimen is effective;  continue present plan and medications.  *If you need a refill on your cardiac medications before your next appointment, please call your pharmacy*  Follow-Up: At [facility], you and your health needs are our priority.  As part of our continuing mission to provide you with exceptional heart care, we have created designated Provider Care Teams.  These Care Teams include your primary Cardiologist (physician) and Advanced Practice Providers (APPs -  Physician Assistants and Nurse Practitioners) who all work together to provide you with the care you need, when you need it.  We recommend signing up for the patient portal called "MyChart".  Sign up information is provided on this After Visit Summary.  MyChart is used to connect with patients for Virtual Visits (Telemedicine).  Patients are able to view lab/test results, encounter notes, upcoming appointments, etc.  Non-urgent messages can be sent to your provider as well.   To learn more about what you can do with MyChart, go to [email or web address].    Your next appointment:   1 year(s)  Provider:   Donato Schultz, MD       Signed, Donato Schultz, MD  12/07/2022 10:02 AM    [facility]

## 2022-12-07 NOTE — Patient Instructions (Signed)
Medication Instructions:  The current medical regimen is effective;  continue present plan and medications.  *If you need a refill on your cardiac medications before your next appointment, please call your pharmacy*  Follow-Up: At  [facility]Care, you and your health needs are our priority.  As part of our continuing mission to provide you with exceptional heart care, we have created designated Provider Care Teams.  These Care Teams include your primary Cardiologist (physician) and Advanced Practice Providers (APPs -  Physician Assistants and Nurse Practitioners) who all work together to provide you with the care you need, when you need it.  We recommend signing up for the patient portal called "MyChart".  Sign up information is provided on this After Visit Summary.  MyChart is used to connect with patients for Virtual Visits (Telemedicine).  Patients are able to view lab/test results, encounter notes, upcoming appointments, etc.  Non-urgent messages can be sent to your provider as well.   To learn more about what you can do with MyChart, go to [email or web address].    Your next appointment:   1 year(s)  Provider:   Mark Skains, MD      

## 2022-12-25 ENCOUNTER — Other Ambulatory Visit: Payer: Self-pay | Admitting: Cardiology

## 2023-01-16 ENCOUNTER — Telehealth: Payer: Self-pay | Admitting: Internal Medicine

## 2023-01-16 NOTE — Telephone Encounter (Signed)
Calling to see if they can us ultrasonic on the patient for dental reasoning. Please advise  

## 2023-01-16 NOTE — Telephone Encounter (Signed)
Returned call to office and spoke with Marisa Livingston to confirm what dental procedures they want to do. Marisa Livingston states that Dr Kaleen Odea wanted to clarify that they can use ultrasonic instruments on them during cleaning since patient has a newly placed pacemaker. Dr Kaleen Odea feels that it's likely okay and that this is was an issue with only old pacemakers, but needs to confirm.

## 2023-01-16 NOTE — Telephone Encounter (Signed)
I spoke with Marisa Livingston at the dental office and relayed the information.

## 2023-01-18 DIAGNOSIS — H524 Presbyopia: Secondary | ICD-10-CM | POA: Diagnosis not present

## 2023-01-18 DIAGNOSIS — Z961 Presence of intraocular lens: Secondary | ICD-10-CM | POA: Diagnosis not present

## 2023-01-18 DIAGNOSIS — H52203 Unspecified astigmatism, bilateral: Secondary | ICD-10-CM | POA: Diagnosis not present

## 2023-01-18 DIAGNOSIS — H5213 Myopia, bilateral: Secondary | ICD-10-CM | POA: Diagnosis not present

## 2023-01-19 DIAGNOSIS — H903 Sensorineural hearing loss, bilateral: Secondary | ICD-10-CM | POA: Diagnosis not present

## 2023-01-19 DIAGNOSIS — H9313 Tinnitus, bilateral: Secondary | ICD-10-CM | POA: Diagnosis not present

## 2023-01-26 ENCOUNTER — Ambulatory Visit (INDEPENDENT_AMBULATORY_CARE_PROVIDER_SITE_OTHER): Payer: PPO

## 2023-01-26 DIAGNOSIS — I429 Cardiomyopathy, unspecified: Secondary | ICD-10-CM

## 2023-01-26 LAB — CUP PACEART REMOTE DEVICE CHECK
Battery Remaining Longevity: 167 mo
Battery Voltage: 3.18 V
Brady Statistic AP VP Percent: 3.47 %
Brady Statistic AP VS Percent: 0.28 %
Brady Statistic AS VP Percent: 34.18 %
Brady Statistic AS VS Percent: 62.06 %
Brady Statistic RA Percent Paced: 3.75 %
Brady Statistic RV Percent Paced: 37.65 %
Date Time Interrogation Session: 20240704235648
Implantable Lead Connection Status: 753985
Implantable Lead Connection Status: 753985
Implantable Lead Implant Date: 20240103
Implantable Lead Implant Date: 20240103
Implantable Lead Location: 753857
Implantable Lead Location: 753859
Implantable Lead Model: 3830
Implantable Lead Model: 5076
Implantable Pulse Generator Implant Date: 20240103
Lead Channel Impedance Value: 323 Ohm
Lead Channel Impedance Value: 361 Ohm
Lead Channel Impedance Value: 380 Ohm
Lead Channel Impedance Value: 475 Ohm
Lead Channel Pacing Threshold Amplitude: 0.625 V
Lead Channel Pacing Threshold Amplitude: 0.875 V
Lead Channel Pacing Threshold Pulse Width: 0.4 ms
Lead Channel Pacing Threshold Pulse Width: 0.4 ms
Lead Channel Sensing Intrinsic Amplitude: 2.125 mV
Lead Channel Sensing Intrinsic Amplitude: 2.125 mV
Lead Channel Sensing Intrinsic Amplitude: 7.5 mV
Lead Channel Sensing Intrinsic Amplitude: 7.5 mV
Lead Channel Setting Pacing Amplitude: 1.5 V
Lead Channel Setting Pacing Amplitude: 2 V
Lead Channel Setting Pacing Pulse Width: 0.4 ms
Lead Channel Setting Sensing Sensitivity: 1.2 mV
Zone Setting Status: 755011

## 2023-02-14 NOTE — Progress Notes (Signed)
Remote pacemaker transmission.   

## 2023-02-27 DIAGNOSIS — M7062 Trochanteric bursitis, left hip: Secondary | ICD-10-CM | POA: Diagnosis not present

## 2023-04-02 ENCOUNTER — Other Ambulatory Visit (INDEPENDENT_AMBULATORY_CARE_PROVIDER_SITE_OTHER): Payer: PPO

## 2023-04-02 ENCOUNTER — Encounter: Payer: Self-pay | Admitting: Orthopedic Surgery

## 2023-04-02 ENCOUNTER — Ambulatory Visit: Payer: PPO | Admitting: Orthopedic Surgery

## 2023-04-02 VITALS — BP 135/80 | HR 105 | Ht 61.0 in | Wt 174.0 lb

## 2023-04-02 DIAGNOSIS — M4316 Spondylolisthesis, lumbar region: Secondary | ICD-10-CM | POA: Diagnosis not present

## 2023-04-02 DIAGNOSIS — M25552 Pain in left hip: Secondary | ICD-10-CM

## 2023-04-02 DIAGNOSIS — M545 Low back pain, unspecified: Secondary | ICD-10-CM

## 2023-04-02 MED ORDER — DICLOFENAC POTASSIUM 50 MG PO TABS: 50.0000 mg | ORAL_TABLET | Freq: Two times a day (BID) | ORAL | 3 refills | Status: DC

## 2023-04-02 NOTE — Patient Instructions (Signed)
Physical therapy has been ordered for you at [facility]. They should call you to schedule, [phone] is the phone number to call, if you want to call to schedule.   

## 2023-04-02 NOTE — Progress Notes (Signed)
Patient: Marisa Livingston           Date of Birth: 1935/11/06           MRN: [id number] Visit Date: 04/02/2023 Requested by: Leone Payor, FNP [address] [place],  [place] [address] PCP: Benita Stabile, MD   Chief Complaint  Patient presents with   Hip Pain    Left PCP told her was bursitis but c/o left Buttocks/ thigh pain    Encounter Diagnoses  Name Primary?   Pain in left hip Yes   Left lumbar pain    Spondylolisthesis, lumbar region     Assessment and plan  87 year old female with back pain radiating to her left hip but not into the knee or leg  Recommend nonoperative treatment We will start her on a 4-week therapy program We will also start anti-inflammatories Return in 6 weeks  Meds ordered this encounter  Medications   diclofenac (CATAFLAM) 50 MG tablet    Sig: Take 1 tablet (50 mg total) by mouth 2 (two) times daily.    Dispense:  60 tablet    Refill:  3     Chief Complaint  Patient presents with   Hip Pain    Left PCP told her was bursitis but c/o left Buttocks/ thigh pain     This is an 87 year old female who presented to her primary care doctors with 4 days of pain in her "left hip".  The pain is actually in the buttock and radiates into the thigh but not to the knee or into the foot.  She did get a shot of Depo-Medrol IM in the buttock area with no relief  She presents for evaluation and management    Body mass index is 32.88 kg/m.   Problem list, medical hx, medications and allergies reviewed   ROS   Allergies  Allergen Reactions   Codeine Nausea Only   Levaquin [Levofloxacin In D5w]     Sick     BP 135/80   Pulse (!) 105   Ht 5\' 1"  (1.549 m)   Wt 174 lb (78.9 kg)   BMI 32.88 kg/m    Physical exam: Physical Exam Vitals and nursing note reviewed.  Constitutional:      Appearance: Normal appearance.  HENT:     Head: Normocephalic and atraumatic.  Eyes:     General: No scleral icterus.       Right eye: No discharge.         Left eye: No discharge.     Extraocular Movements: Extraocular movements intact.     Conjunctiva/sclera: Conjunctivae normal.     Pupils: Pupils are equal, round, and reactive to light.  Cardiovascular:     Rate and Rhythm: Normal rate.     Pulses: Normal pulses.  Skin:    General: Skin is warm and dry.     Capillary Refill: Capillary refill takes less than 2 seconds.  Neurological:     General: No focal deficit present.     Mental Status: She is alert and oriented to person, place, and time.  Psychiatric:        Mood and Affect: Mood normal.        Behavior: Behavior normal.        Thought Content: Thought content normal.        Judgment: Judgment normal.     Back Exam   Tenderness  The patient is experiencing tenderness in the lumbar (Left buttock tenderness, left posterior thigh  tenderness).  Range of Motion  Extension:  abnormal  Flexion:  abnormal   Other  Gait: abnormal  Erythema: no back redness Scars: absent  Comments:  Lower extremity strength normal muscle tone normal no sensory deficits      Data reviewed: Outside records from primary care documenting the patient's medical conditions the fact that she got the injection  Image(s) reviewed with personal interpretation:  Spondylolisthesis grade 1 L5-S1 spondylosis of the facet joints degenerative disc changes  Assessment and plan:  Encounter Diagnoses  Name Primary?   Pain in left hip Yes   Left lumbar pain    Spondylolisthesis, lumbar region    Meds ordered this encounter  Medications   diclofenac (CATAFLAM) 50 MG tablet    Sig: Take 1 tablet (50 mg total) by mouth 2 (two) times daily.    Dispense:  60 tablet    Refill:  3

## 2023-04-03 ENCOUNTER — Encounter: Payer: Self-pay | Admitting: Cardiology

## 2023-04-04 ENCOUNTER — Telehealth: Payer: Self-pay | Admitting: Orthopedic Surgery

## 2023-04-04 NOTE — Telephone Encounter (Signed)
Yes,  it can be used instead she wants, she can ask the cardiologist, she states she will, I told her they are both Antiinflammatories, she then asked about Aleve, told her its also the same, and ok with Dr Romeo Apple but if cardiology doesn't want her taking one, she should ask them first. She voiced understanding.

## 2023-04-04 NOTE — Telephone Encounter (Signed)
Dr. Mort Sawyers pt - spoke w/the patient, she stated that she was seen on Monday and that Dr. Romeo Apple prescribed Diclofenac.  She stated that she looked up this medication and it has a lot of side effects.  She stated that her heart doctor doesn't want her taking this medication.  She stated that she has heart failure and has had a stroke in the past.  She wants to know if Dr. Romeo Apple would be ok with taking Ibuprofen instead.  [phone]

## 2023-04-17 DIAGNOSIS — D0472 Carcinoma in situ of skin of left lower limb, including hip: Secondary | ICD-10-CM | POA: Diagnosis not present

## 2023-04-17 DIAGNOSIS — L821 Other seborrheic keratosis: Secondary | ICD-10-CM | POA: Diagnosis not present

## 2023-04-17 DIAGNOSIS — D1801 Hemangioma of skin and subcutaneous tissue: Secondary | ICD-10-CM | POA: Diagnosis not present

## 2023-04-17 DIAGNOSIS — L814 Other melanin hyperpigmentation: Secondary | ICD-10-CM | POA: Diagnosis not present

## 2023-04-17 DIAGNOSIS — L570 Actinic keratosis: Secondary | ICD-10-CM | POA: Diagnosis not present

## 2023-04-17 DIAGNOSIS — D045 Carcinoma in situ of skin of trunk: Secondary | ICD-10-CM | POA: Diagnosis not present

## 2023-04-19 DIAGNOSIS — R7303 Prediabetes: Secondary | ICD-10-CM | POA: Diagnosis not present

## 2023-04-19 DIAGNOSIS — Z23 Encounter for immunization: Secondary | ICD-10-CM | POA: Diagnosis not present

## 2023-04-19 DIAGNOSIS — E782 Mixed hyperlipidemia: Secondary | ICD-10-CM | POA: Diagnosis not present

## 2023-04-24 DIAGNOSIS — H9313 Tinnitus, bilateral: Secondary | ICD-10-CM | POA: Diagnosis not present

## 2023-04-24 DIAGNOSIS — Z8673 Personal history of transient ischemic attack (TIA), and cerebral infarction without residual deficits: Secondary | ICD-10-CM | POA: Diagnosis not present

## 2023-04-24 DIAGNOSIS — I495 Sick sinus syndrome: Secondary | ICD-10-CM | POA: Diagnosis not present

## 2023-04-24 DIAGNOSIS — E875 Hyperkalemia: Secondary | ICD-10-CM | POA: Diagnosis not present

## 2023-04-24 DIAGNOSIS — I442 Atrioventricular block, complete: Secondary | ICD-10-CM | POA: Diagnosis not present

## 2023-04-24 DIAGNOSIS — E782 Mixed hyperlipidemia: Secondary | ICD-10-CM | POA: Diagnosis not present

## 2023-04-24 DIAGNOSIS — I130 Hypertensive heart and chronic kidney disease with heart failure and stage 1 through stage 4 chronic kidney disease, or unspecified chronic kidney disease: Secondary | ICD-10-CM | POA: Diagnosis not present

## 2023-04-24 DIAGNOSIS — I5022 Chronic systolic (congestive) heart failure: Secondary | ICD-10-CM | POA: Diagnosis not present

## 2023-04-24 DIAGNOSIS — N1831 Chronic kidney disease, stage 3a: Secondary | ICD-10-CM | POA: Diagnosis not present

## 2023-04-24 DIAGNOSIS — I10 Essential (primary) hypertension: Secondary | ICD-10-CM | POA: Diagnosis not present

## 2023-04-24 DIAGNOSIS — M25552 Pain in left hip: Secondary | ICD-10-CM | POA: Diagnosis not present

## 2023-04-24 DIAGNOSIS — R7303 Prediabetes: Secondary | ICD-10-CM | POA: Diagnosis not present

## 2023-04-25 ENCOUNTER — Other Ambulatory Visit: Payer: Self-pay

## 2023-04-25 ENCOUNTER — Ambulatory Visit (HOSPITAL_COMMUNITY): Payer: PPO | Attending: Orthopedic Surgery

## 2023-04-25 DIAGNOSIS — M4316 Spondylolisthesis, lumbar region: Secondary | ICD-10-CM | POA: Insufficient documentation

## 2023-04-25 DIAGNOSIS — M25552 Pain in left hip: Secondary | ICD-10-CM | POA: Insufficient documentation

## 2023-04-25 DIAGNOSIS — M545 Low back pain, unspecified: Secondary | ICD-10-CM | POA: Diagnosis not present

## 2023-04-25 NOTE — Therapy (Signed)
OUTPATIENT PHYSICAL THERAPY THORACOLUMBAR EVALUATION   Patient Name: Marisa Livingston MRN: [id number] DOB:[birth year], 87 y.o., female Today's Date: 04/25/2023  END OF SESSION:  PT End of Session - 04/25/23 0954     Visit Number 1    Number of Visits 8    Date for PT Re-Evaluation 05/23/23    Authorization Type [employer]    Progress Note Due on Visit 8    PT Start Time 0845    PT Stop Time 0935    PT Time Calculation (min) 50 min    Activity Tolerance Patient tolerated treatment well    Behavior During Therapy [facility] for tasks assessed/performed             Past Medical History:  Diagnosis Date   Allergic rhinitis    Hyperlipidemia    Insomnia    LBBB (left bundle branch block)    Osteopenia    Stroke (HCC) 08/28/2022   Past Surgical History:  Procedure Laterality Date   COLONOSCOPY N/A 09/02/2015   Procedure: COLONOSCOPY;  Surgeon: Malissa Hippo, MD;  [facility];  Service: Endoscopy;  Laterality: N/A;  1025 - moved to 2/9 @ 8:30 - Ann notified pt   PACEMAKER IMPLANT N/A 07/26/2022   Procedure: PACEMAKER IMPLANT;  Surgeon: Marinus Maw, MD;  Location: [facility] INVASIVE CV LAB;  Service: Cardiovascular;  Laterality: N/A;   POLYPECTOMY N/A 09/02/2015   Procedure: POLYPECTOMY;  Surgeon: Malissa Hippo, MD;  [facility];  Service: Endoscopy;  Laterality: N/A;  Hepatic Flexure Polyp   Patient Active Problem List   Diagnosis Date Noted   TIA (transient ischemic attack) 08/28/2022   Pacemaker 08/28/2022   Elevated troponin 08/28/2022   Complete heart block (HCC) 07/21/2022   Osteopenia 01/12/2021   Chronic systolic congestive heart failure (HCC) 12/15/2020   Tachycardia 06/19/2020   Left bundle branch block (LBBB) on electrocardiogram 06/09/2020   Serum potassium elevated 06/09/2020   Chronic kidney disease, stage 3 (HCC) 04/17/2018   Hyperlipidemia LDL goal <130 11/20/2012    PCP: Benita Stabile., MD  REFERRING PROVIDER: Vickki Hearing, MD  REFERRING DIAG: M54.50 (ICD-10-CM) - Left lumbar pain M43.16 (ICD-10-CM) - Spondylolisthesis, lumbar region (evaluate and treat lumbar spine 3x weekly for 6 weeks)  Rationale for Evaluation and Treatment: Rehabilitation  THERAPY DIAG:  Left-sided low back pain without sciatica, unspecified chronicity  Pain in left hip  ONSET DATE: August 2024  SUBJECTIVE:  SUBJECTIVE STATEMENT: Arrives to the clinic with c/o pain on the L side of the low back and hip. Condition started August 2024 without apparent reason and gradually got worse. Patient went to her MD and was diagnosed with bursitis. Was given an injection but did not help. Patient was then referred to ortho MD. X-ray was done and was prescribed with meds and have PT evaluation and management.  PERTINENT HISTORY:  Osteopenia, heart failure  PAIN:  Are you having pain? Yes: NPRS scale: 7/10 Pain location: L side of low back and hip Pain description: aching pain Aggravating factors: standing up from sitting (8/10) Relieving factors: Aspercreme  PRECAUTIONS: Other: no lifting > 10 lbs  RED FLAGS: Bowel or bladder incontinence: No, Spinal tumors: No, Cauda equina syndrome: No, and Compression fracture: No   WEIGHT BEARING RESTRICTIONS: No  FALLS:  Has patient fallen in last 6 months? No  LIVING ENVIRONMENT: Lives with: lives alone Lives in: House/apartment Stairs: Yes: External: 3 steps; can reach both Has following equipment at home: Single point cane, Wheelchair (manual), and standard walker  OCCUPATION: retired  PLOF: Independent and Independent with basic ADLs  PATIENT GOALS: be relieved of pain  NEXT MD VISIT: 05/14/23  OBJECTIVE:  Note: Objective measures were completed at Evaluation unless otherwise  noted.  DIAGNOSTIC FINDINGS:  04/02/2023 Spinal imaging   Back and buttock pain   Spinal imaging shows osteophyte formation L1, 2, 3   CC abnormal spinal sagittal plane as well as the coronal plane without frank scoliosis   The spot film definitely tells [place] we had anterolisthesis of L5 grade 1/2 facet joint arthritis and possible spondylolysis at L5-S1, this is associated with facet arthritis starting at L2-S1   Impression Moderate spondylosis Spondylolisthesis L5 on S1 Possible spondylolysis at L5-S1 Sagittal and coronal plane alignment scoliosis  AP pelvis to screen for hip arthritis   There is no evidence of joint space narrowing head neck offset on the AP view (inaccurate view of FAI) normal head normal head and acetabulum   Normal hip  PATIENT SURVEYS:  Modified Oswestry 9/50 = 18%   SCREENING FOR RED FLAGS: Bowel or bladder incontinence: No Spinal tumors: No Cauda equina syndrome: No Compression fracture: No Abdominal aneurysm: No  COGNITION: Overall cognitive status: Within functional limits for tasks assessed     SENSATION: Not tested  MUSCLE LENGTH: Hamstrings: moderate restriction on B Thomas test: WFL on B Piriformis test: Mild restriction on the L, WFL R Gastrocsoleus: mild restriction B  POSTURE:  Standing: slight increase in lordosis, L PSIS higher, L ASIS lower, L iliac crest higher; Supine: L LE= 82.5 cm, R LE = 81 cm, got short in supine to sit  PALPATION: Grade 1 tenderness  LUMBAR ROM:   AROM eval  Flexion 100%  Extension 50%  Right lateral flexion   Left lateral flexion   Right rotation 100%  Left rotation 100%   (Blank rows = not tested)  LOWER EXTREMITY ROM:     Active  Right eval Left eval  Hip flexion [facility]  Hip extension    Hip abduction [facility]  Hip adduction    Hip internal rotation    Hip external rotation    Knee flexion [facility]  Knee extension [facility]  Ankle dorsiflexion [facility] WFL  Ankle plantarflexion [facility] WFL   Ankle inversion    Ankle eversion     (Blank rows = not tested)  LOWER EXTREMITY MMT:    MMT Right eval Left  eval  Hip flexion 4 3+  Hip extension 3+ (Bridging) 3+ (Bridging)  Hip abduction 4- 3+  Hip adduction    Hip internal rotation    Hip external rotation    Knee flexion 4 4  Knee extension 4 3+  Ankle dorsiflexion 4 4  Ankle plantarflexion 4 4  Ankle inversion    Ankle eversion     (Blank rows = not tested)  LUMBAR SPECIAL TESTS:  Straight leg raise test: Negative, (+) L compression test  FUNCTIONAL TESTS:  5 times sit to stand: 12.76 sec (did not report of pain) 2 minute walk test: 423 ft reported of pain on the last 30 sec  GAIT: Distance walked: 423 ft Assistive device utilized: None Level of assistance: Complete Independence Comments: No gait deviations noted. Reported of fatigue at the end. Felt normal after a few minutes of rest. BP at the end of the session 128/75 mmHg with HR =75 bpm  TODAY'S TREATMENT:                                                                                                                              DATE:  04/25/23  Evaluation and patient education done   PATIENT EDUCATION:  Education details: Educated on the pathoanatomy of low back pain. Educated on the goals and course of rehab.  Person educated: Patient Education method: Explanation Education comprehension: verbalized understanding  HOME EXERCISE PROGRAM: None provided to date  ASSESSMENT:  CLINICAL IMPRESSION: Patient is a 87 y.o. female who was seen today for physical therapy evaluation and treatment for lumbar spondylolisthesis. Patient was diagnosed with lumbar spondylolisthesis by referring provider further defined by difficulty with sit-to-stand due to pain, weakness, and decreased soft tissue extensibility. Skilled PT is required to address the impairments and functional limitations listed below.    OBJECTIVE IMPAIRMENTS: decreased activity tolerance,  decreased strength, impaired flexibility, postural dysfunction, and pain.   ACTIVITY LIMITATIONS: carrying, lifting, bending, sitting, standing, and squatting  PARTICIPATION LIMITATIONS: meal prep, cleaning, laundry, driving, shopping, and yard work  PERSONAL FACTORS: Age and 1 comorbidity: heart failure  are also affecting patient's functional outcome.   REHAB POTENTIAL: Good  CLINICAL DECISION MAKING: Stable/uncomplicated  EVALUATION COMPLEXITY: Low   GOALS: Goals reviewed with patient? Yes  SHORT TERM GOALS: Target date: 04/29/23  Pt will demonstrate indep in HEP to facilitate carry-over of skilled services and improve Goal status: INITIAL  LONG TERM GOALS: Target date: 05/23/23  Pt will demonstrate a decrease in modified ODI score by 13-14% to demonstrate significant improvement in ADLs Baseline: 18% Goal status: INITIAL  2.  Pt will demonstrate increase in LE strength to 4/5 to facilitate ease and safety in ambulation  Baseline: 3+/5 Goal status: INITIAL  3.  Pt will demonstrate improved flexibility in the LEs to mild restriction to facilitate ease in ambulation and ADLs Baseline: moderate restriction Goal status: INITIAL   PLAN:  PT FREQUENCY: 3x/week  PT DURATION: 6 weeks (  but patient prefers 2x/week for 4 weeks)  PLANNED INTERVENTIONS: Therapeutic exercises, Therapeutic activity, Neuromuscular re-education, Patient/Family education, Self Care, and Manual therapy.  PLAN FOR NEXT SESSION: Provide HEP. May start MET to correct the presence of anterior innominate on the L and provide core and hip strengthening.   Tish Frederickson. Amana Bouska, PT, DPT, OCS Board-Certified Clinical Specialist in Orthopedic PT PT Compact Privilege # (Livingston): [id number] T 04/25/2023, 9:57 AM

## 2023-04-26 ENCOUNTER — Encounter (HOSPITAL_COMMUNITY): Payer: Self-pay

## 2023-04-26 ENCOUNTER — Ambulatory Visit (HOSPITAL_COMMUNITY): Payer: PPO

## 2023-04-26 DIAGNOSIS — M545 Low back pain, unspecified: Secondary | ICD-10-CM

## 2023-04-26 DIAGNOSIS — M25552 Pain in left hip: Secondary | ICD-10-CM

## 2023-04-26 NOTE — Therapy (Signed)
OUTPATIENT PHYSICAL THERAPY THORACOLUMBAR TREATMENT   Patient Name: Marisa Livingston MRN: [id number] DOB:08/06/1935, 87 y.o., female Today's Date: 04/26/2023  END OF SESSION:  PT End of Session - 04/26/23 0926     Visit Number 1    Number of Visits 8    Date for PT Re-Evaluation 05/23/23    Authorization Type [employer]    Authorization - Visit Number 1    Progress Note Due on Visit 8    PT Start Time 0845    PT Stop Time 0925    PT Time Calculation (min) 40 min    Activity Tolerance Patient tolerated treatment well    Behavior During Therapy [facility] for tasks assessed/performed              Past Medical History:  Diagnosis Date   Allergic rhinitis    Hyperlipidemia    Insomnia    LBBB (left bundle branch block)    Osteopenia    Stroke (HCC) 08/28/2022   Past Surgical History:  Procedure Laterality Date   COLONOSCOPY N/A 09/02/2015   Procedure: COLONOSCOPY;  Surgeon: Malissa Hippo, MD;  [facility];  Service: Endoscopy;  Laterality: N/A;  1025 - moved to 2/9 @ 8:30 - Ann notified pt   PACEMAKER IMPLANT N/A 07/26/2022   Procedure: PACEMAKER IMPLANT;  Surgeon: Marinus Maw, MD;  Location: [facility] INVASIVE CV LAB;  Service: Cardiovascular;  Laterality: N/A;   POLYPECTOMY N/A 09/02/2015   Procedure: POLYPECTOMY;  Surgeon: Malissa Hippo, MD;  [facility];  Service: Endoscopy;  Laterality: N/A;  Hepatic Flexure Polyp   Patient Active Problem List   Diagnosis Date Noted   TIA (transient ischemic attack) 08/28/2022   Pacemaker 08/28/2022   Elevated troponin 08/28/2022   Complete heart block (HCC) 07/21/2022   Osteopenia 01/12/2021   Chronic systolic congestive heart failure (HCC) 12/15/2020   Tachycardia 06/19/2020   Left bundle branch block (LBBB) on electrocardiogram 06/09/2020   Serum potassium elevated 06/09/2020   Chronic kidney disease, stage 3 (HCC) 04/17/2018   Hyperlipidemia LDL goal <130 11/20/2012    PCP: Benita Stabile.,  MD  REFERRING PROVIDER: Vickki Hearing, MD  REFERRING DIAG: M54.50 (ICD-10-CM) - Left lumbar pain M43.16 (ICD-10-CM) - Spondylolisthesis, lumbar region (evaluate and treat lumbar spine 3x weekly for 6 weeks)  Rationale for Evaluation and Treatment: Rehabilitation  THERAPY DIAG:  Left-sided low back pain without sciatica, unspecified chronicity  Pain in left hip  ONSET DATE: August 2024  SUBJECTIVE:  SUBJECTIVE STATEMENT: 5/10 pain this session.   Eval: Arrives to the clinic with c/o pain on the L side of the low back and hip. Condition started August 2024 without apparent reason and gradually got worse. Patient went to her MD and was diagnosed with bursitis. Was given an injection but did not help. Patient was then referred to ortho MD. X-ray was done and was prescribed with meds and have PT evaluation and management.  PERTINENT HISTORY:  Osteopenia, heart failure  PAIN:  Are you having pain? Yes: NPRS scale: 7/10 Pain location: L side of low back and hip Pain description: aching pain Aggravating factors: standing up from sitting (8/10) Relieving factors: Aspercreme  PRECAUTIONS: Other: no lifting > 10 lbs  RED FLAGS: Bowel or bladder incontinence: No, Spinal tumors: No, Cauda equina syndrome: No, and Compression fracture: No   WEIGHT BEARING RESTRICTIONS: No  FALLS:  Has patient fallen in last 6 months? No  LIVING ENVIRONMENT: Lives with: lives alone Lives in: House/apartment Stairs: Yes: External: 3 steps; can reach both Has following equipment at home: Single point cane, Wheelchair (manual), and standard walker  OCCUPATION: retired  PLOF: Independent and Independent with basic ADLs  PATIENT GOALS: be relieved of pain  NEXT MD VISIT: 05/14/23  OBJECTIVE:  Note: Objective  measures were completed at Evaluation unless otherwise noted.  DIAGNOSTIC FINDINGS:  04/02/2023 Spinal imaging   Back and buttock pain   Spinal imaging shows osteophyte formation L1, 2, 3   CC abnormal spinal sagittal plane as well as the coronal plane without frank scoliosis   The spot film definitely tells [place] we had anterolisthesis of L5 grade 1/2 facet joint arthritis and possible spondylolysis at L5-S1, this is associated with facet arthritis starting at L2-S1   Impression Moderate spondylosis Spondylolisthesis L5 on S1 Possible spondylolysis at L5-S1 Sagittal and coronal plane alignment scoliosis  AP pelvis to screen for hip arthritis   There is no evidence of joint space narrowing head neck offset on the AP view (inaccurate view of FAI) normal head normal head and acetabulum   Normal hip  PATIENT SURVEYS:  Modified Oswestry 9/50 = 18%   SCREENING FOR RED FLAGS: Bowel or bladder incontinence: No Spinal tumors: No Cauda equina syndrome: No Compression fracture: No Abdominal aneurysm: No  COGNITION: Overall cognitive status: Within functional limits for tasks assessed     SENSATION: Not tested  MUSCLE LENGTH: Hamstrings: moderate restriction on B Thomas test: WFL on B Piriformis test: Mild restriction on the L, WFL R Gastrocsoleus: mild restriction B  POSTURE:  Standing: slight increase in lordosis, L PSIS higher, L ASIS lower, L iliac crest higher; Supine: L LE= 82.5 cm, R LE = 81 cm, got short in supine to sit  PALPATION: Grade 1 tenderness  LUMBAR ROM:   AROM eval  Flexion 100%  Extension 50%  Right lateral flexion   Left lateral flexion   Right rotation 100%  Left rotation 100%   (Blank rows = not tested)  LOWER EXTREMITY ROM:     Active  Right eval Left eval  Hip flexion [facility]  Hip extension    Hip abduction [facility]  Hip adduction    Hip internal rotation    Hip external rotation    Knee flexion [facility]  Knee extension [facility]   Ankle dorsiflexion [facility] WFL  Ankle plantarflexion [facility] WFL  Ankle inversion    Ankle eversion     (Blank rows = not tested)  LOWER EXTREMITY MMT:  MMT Right eval Left eval  Hip flexion 4 3+  Hip extension 3+ (Bridging) 3+ (Bridging)  Hip abduction 4- 3+  Hip adduction    Hip internal rotation    Hip external rotation    Knee flexion 4 4  Knee extension 4 3+  Ankle dorsiflexion 4 4  Ankle plantarflexion 4 4  Ankle inversion    Ankle eversion     (Blank rows = not tested)  LUMBAR SPECIAL TESTS:  Straight leg raise test: Negative, (+) L compression test  FUNCTIONAL TESTS:  5 times sit to stand: 12.76 sec (did not report of pain) 2 minute walk test: 423 ft reported of pain on the last 30 sec  GAIT: Distance walked: 423 ft Assistive device utilized: None Level of assistance: Complete Independence Comments: No gait deviations noted. Reported of fatigue at the end. Felt normal after a few minutes of rest. BP at the end of the session 128/75 mmHg with HR =75 bpm  TODAY'S TREATMENT:                                                                                                                              DATE:  04/26/2023  -3x5 STS with RTB around knees 5/10 RPE.  -2 x 5x5'' hip vectors bilaterally- cued for reducing trunk compensation -Muscle energy technique with LLE hip flexion and RLE hip extension with therapist manual cuing 5 x 3'' isometric- 7/10 RPE and BP 133/77 with HR 105. HOB elevated due to pt request with pacemaker -1x 10 bridges with cues for breathing with movement- posterior pelvic tilt cuing. Built HEP, discussion of findings.  04/25/23  Evaluation and patient education done   PATIENT EDUCATION:  Education details: see HEP below.   Person educated: Patient Education method: Explanation Education comprehension: verbalized understanding  HOME EXERCISE PROGRAM: Access Code: [email or web address] URL: [email or web address] Date:  04/26/2023 Prepared by: Starling Manns  Exercises - Supine Bridge  - 1 x daily - 7 x weekly - 3 sets - 10 reps - Standing Hip Abduction  - 1 x daily - 7 x weekly - 3 sets - 10 reps - Standing Hip Extension  - 1 x daily - 7 x weekly - 3 sets - 10 reps - Standing Hip Flexion with Counter Support  - 1 x daily - 7 x weekly - 3 sets - 10 reps - Supine Posterior Pelvic Tilt  - 1 x daily - 7 x weekly - 3 sets - 10 reps  ASSESSMENT:  CLINICAL IMPRESSION: Educated on finding from evaluation and about repetitive movement patterns over time can lead to degenerative changes. Pt initiated with basic closed chain standing activities to promote gluteal strengthening and functional stance. Developed HEP as well. Note trunk compensations in standing due to gluteal weakness. Noted increased WOB with muscle energy technique, 133/77, HR 105. Minimize exertion due to increase WOB. Heavy cuing for breathing with movement as pt holds breath.  Eval: Patient is a 87  y.o. female who was seen today for physical therapy evaluation and treatment for lumbar spondylolisthesis. Patient was diagnosed with lumbar spondylolisthesis by referring provider further defined by difficulty with sit-to-stand due to pain, weakness, and decreased soft tissue extensibility. Skilled PT is required to address the impairments and functional limitations listed below.    OBJECTIVE IMPAIRMENTS: decreased activity tolerance, decreased strength, impaired flexibility, postural dysfunction, and pain.   ACTIVITY LIMITATIONS: carrying, lifting, bending, sitting, standing, and squatting  PARTICIPATION LIMITATIONS: meal prep, cleaning, laundry, driving, shopping, and yard work  PERSONAL FACTORS: Age and 1 comorbidity: heart failure  are also affecting patient's functional outcome.   REHAB POTENTIAL: Good  CLINICAL DECISION MAKING: Stable/uncomplicated  EVALUATION COMPLEXITY: Low   GOALS: Goals reviewed with patient? Yes  SHORT TERM GOALS:  Target date: 04/29/23  Pt will demonstrate indep in HEP to facilitate carry-over of skilled services and improve Goal status: INITIAL  LONG TERM GOALS: Target date: 05/23/23  Pt will demonstrate a decrease in modified ODI score by 13-14% to demonstrate significant improvement in ADLs Baseline: 18% Goal status: INITIAL  2.  Pt will demonstrate increase in LE strength to 4/5 to facilitate ease and safety in ambulation  Baseline: 3+/5 Goal status: INITIAL  3.  Pt will demonstrate improved flexibility in the LEs to mild restriction to facilitate ease in ambulation and ADLs Baseline: moderate restriction Goal status: INITIAL   PLAN:  PT FREQUENCY: 3x/week  PT DURATION: 6 weeks (but patient prefers 2x/week for 4 weeks)  PLANNED INTERVENTIONS: Therapeutic exercises, Therapeutic activity, Neuromuscular re-education, Patient/Family education, Self Care, and Manual therapy.  PLAN FOR NEXT SESSION: Provide HEP. May start MET to correct the presence of anterior innominate on the L and provide core and hip strengthening.   Nelida Meuse PT, DPT Physical Therapist with [facility] [phone] office  04/26/2023, 9:27 AM

## 2023-04-27 ENCOUNTER — Ambulatory Visit (INDEPENDENT_AMBULATORY_CARE_PROVIDER_SITE_OTHER): Payer: PPO

## 2023-04-27 DIAGNOSIS — I429 Cardiomyopathy, unspecified: Secondary | ICD-10-CM | POA: Diagnosis not present

## 2023-04-27 LAB — CUP PACEART REMOTE DEVICE CHECK
Battery Remaining Longevity: 158 mo
Battery Voltage: 3.15 V
Brady Statistic AP VP Percent: 5.27 %
Brady Statistic AP VS Percent: 0.2 %
Brady Statistic AS VP Percent: 29.57 %
Brady Statistic AS VS Percent: 64.96 %
Brady Statistic RA Percent Paced: 5.46 %
Brady Statistic RV Percent Paced: 34.84 %
Date Time Interrogation Session: 20241003213713
Implantable Lead Connection Status: 753985
Implantable Lead Connection Status: 753985
Implantable Lead Implant Date: 20240103
Implantable Lead Implant Date: 20240103
Implantable Lead Location: 753857
Implantable Lead Location: 753859
Implantable Lead Model: 3830
Implantable Lead Model: 5076
Implantable Pulse Generator Implant Date: 20240103
Lead Channel Impedance Value: 323 Ohm
Lead Channel Impedance Value: 342 Ohm
Lead Channel Impedance Value: 380 Ohm
Lead Channel Impedance Value: 456 Ohm
Lead Channel Pacing Threshold Amplitude: 0.625 V
Lead Channel Pacing Threshold Amplitude: 1.125 V
Lead Channel Pacing Threshold Pulse Width: 0.4 ms
Lead Channel Pacing Threshold Pulse Width: 0.4 ms
Lead Channel Sensing Intrinsic Amplitude: 2.125 mV
Lead Channel Sensing Intrinsic Amplitude: 2.125 mV
Lead Channel Sensing Intrinsic Amplitude: 8.375 mV
Lead Channel Sensing Intrinsic Amplitude: 8.375 mV
Lead Channel Setting Pacing Amplitude: 1.5 V
Lead Channel Setting Pacing Amplitude: 2.25 V
Lead Channel Setting Pacing Pulse Width: 0.4 ms
Lead Channel Setting Sensing Sensitivity: 1.2 mV
Zone Setting Status: 755011

## 2023-05-01 ENCOUNTER — Encounter (HOSPITAL_COMMUNITY): Payer: PPO

## 2023-05-03 ENCOUNTER — Encounter (HOSPITAL_COMMUNITY): Payer: PPO

## 2023-05-08 ENCOUNTER — Encounter (HOSPITAL_COMMUNITY): Payer: PPO | Admitting: Physical Therapy

## 2023-05-09 NOTE — Progress Notes (Signed)
Remote pacemaker transmission.   

## 2023-05-11 ENCOUNTER — Encounter: Payer: Self-pay | Admitting: Cardiology

## 2023-05-11 ENCOUNTER — Encounter (HOSPITAL_COMMUNITY): Payer: PPO

## 2023-05-14 ENCOUNTER — Ambulatory Visit: Payer: PPO | Admitting: Orthopedic Surgery

## 2023-05-14 ENCOUNTER — Encounter: Payer: Self-pay | Admitting: Orthopedic Surgery

## 2023-05-14 VITALS — Ht 61.0 in | Wt 174.0 lb

## 2023-05-14 DIAGNOSIS — M545 Low back pain, unspecified: Secondary | ICD-10-CM

## 2023-05-14 NOTE — Patient Instructions (Signed)
Let us know if you want the referral to see the Spine surgeon and we can refer

## 2023-05-14 NOTE — Progress Notes (Signed)
Follow-up  Encounter Diagnosis  Name Primary?   Left lumbar pain Yes    Back pain radiation into the left hip does not go below the knee treated with anti-inflammatories C[place]am 50 mg twice a day tried physical therapy could not tolerated due to shortness of breath  Patient says she can tolerate the pain that she is having as she only has it when she is standing such as standing at the kitchen sink for long period of time  She did not want to see orthospine at this time  Follow-up with [place] as needed

## 2023-05-16 ENCOUNTER — Encounter (HOSPITAL_COMMUNITY): Payer: PPO

## 2023-05-18 ENCOUNTER — Encounter (HOSPITAL_COMMUNITY): Payer: PPO

## 2023-07-13 ENCOUNTER — Ambulatory Visit: Payer: Self-pay | Admitting: Internal Medicine

## 2023-07-20 ENCOUNTER — Encounter: Payer: Self-pay | Admitting: Internal Medicine

## 2023-07-20 ENCOUNTER — Ambulatory Visit (INDEPENDENT_AMBULATORY_CARE_PROVIDER_SITE_OTHER): Payer: PPO | Admitting: Internal Medicine

## 2023-07-20 VITALS — BP 132/71 | HR 95 | Ht 62.0 in | Wt 177.0 lb

## 2023-07-20 DIAGNOSIS — Z8673 Personal history of transient ischemic attack (TIA), and cerebral infarction without residual deficits: Secondary | ICD-10-CM | POA: Insufficient documentation

## 2023-07-20 DIAGNOSIS — I442 Atrioventricular block, complete: Secondary | ICD-10-CM

## 2023-07-20 DIAGNOSIS — E785 Hyperlipidemia, unspecified: Secondary | ICD-10-CM

## 2023-07-20 DIAGNOSIS — I5022 Chronic systolic (congestive) heart failure: Secondary | ICD-10-CM | POA: Diagnosis not present

## 2023-07-20 DIAGNOSIS — R7303 Prediabetes: Secondary | ICD-10-CM | POA: Insufficient documentation

## 2023-07-20 DIAGNOSIS — M8580 Other specified disorders of bone density and structure, unspecified site: Secondary | ICD-10-CM

## 2023-07-20 NOTE — Assessment & Plan Note (Signed)
Previously documented history of osteopenia.  Currently on vitamin D and calcium supplementation.  She has tried Fosamax in the past and could not tolerate it.

## 2023-07-20 NOTE — Assessment & Plan Note (Signed)
History of complete heart block s/p pacemaker implantation in early January 2024.  Recently seen by cardiology (Dr. Ladona Ridgel) for follow-up.

## 2023-07-20 NOTE — Patient Instructions (Signed)
 It was a pleasure to see you today.  Thank you for giving [place] the opportunity to be involved in your care.  Below is a brief recap of your visit and next steps.  We will plan to see you again in 6 months.  Summary You have established care today No medication changes were made We will check basic labs Follow up in 6 months

## 2023-07-20 NOTE — Progress Notes (Signed)
New Patient Office Visit  Subjective    Patient ID: Marisa Livingston, female    DOB: May 23, 1936  Age: 87 y.o. MRN: [id number]  CC:  Chief Complaint  Patient presents with   Establish Care    HPI Marisa Livingston presents to establish care.  She is an 87 year old woman with a past medical history significant for CHF, HLD, CVA, complete heart block s/p pacemaker implantation (January 2024), prediabetes, and osteopenia.  Previously followed by Dwana Melena, MD.  Ms. Machain reports feeling well today.  She is asymptomatic and has no acute concerns to discuss aside from desiring to establish care.  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  She denies tobacco, alcohol, and illicit drug use.  Family medical history is significant for CAD.  Chronic medical conditions and outstanding preventative care items discussed today are individually addressed in A/P below.   Outpatient Encounter Medications as of 07/20/2023  Medication Sig   acetaminophen (TYLENOL) 325 MG tablet Take 325-650 mg by mouth every 6 (six) hours as needed for mild pain, fever or headache.   aspirin EC 81 MG tablet Take 1 tablet (81 mg total) by mouth daily. Swallow whole.   Calcium Citrate (CITRACAL PO) Take 1 tablet by mouth daily.   Multiple Vitamins-Minerals (MULTIVITAMIN WITH MINERALS) tablet Take 1 tablet by mouth daily.   rosuvastatin (CRESTOR) 20 MG tablet Take 1 tablet (20 mg total) by mouth daily.   sacubitril-valsartan (ENTRESTO) 24-26 MG TAKE ONE TABLET BY MOUTH TWO TIMES DAILY.   No facility-administered encounter medications on file as of 07/20/2023.    Past Medical History:  Diagnosis Date   Allergic rhinitis    Hyperlipidemia    Insomnia    LBBB (left bundle branch block)    Osteopenia    Stroke (HCC) 08/28/2022    Past Surgical History:  Procedure Laterality Date   COLONOSCOPY N/A 09/02/2015   Procedure: COLONOSCOPY;  Surgeon: Malissa Hippo, MD;   [facility];  Service: Endoscopy;  Laterality: N/A;  1025 - moved to 2/9 @ 8:30 - Ann notified pt   PACEMAKER IMPLANT N/A 07/26/2022   Procedure: PACEMAKER IMPLANT;  Surgeon: Marinus Maw, MD;  Location: [facility] INVASIVE CV LAB;  Service: Cardiovascular;  Laterality: N/A;   POLYPECTOMY N/A 09/02/2015   Procedure: POLYPECTOMY;  Surgeon: Malissa Hippo, MD;  [facility];  Service: Endoscopy;  Laterality: N/A;  Hepatic Flexure Polyp    Family History  Problem Relation Age of Onset   Heart Problems Mother        had a pacemaker   Cancer Brother     Social History   Socioeconomic History   Marital status: Married    Spouse name: Not on file   Number of children: Not on file   Years of education: Not on file   Highest education level: Not on file  Occupational History   Not on file  Tobacco Use   Smoking status: Never   Smokeless tobacco: Never  Vaping Use   Vaping status: Never Used  Substance and Sexual Activity   Alcohol use: No   Drug use: No   Sexual activity: Not Currently    Birth control/protection: Post-menopausal  Other Topics Concern   Not on file  Social History Narrative   Lives in alone in [place]   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[employer]   Social Drivers of Corporate investment banker Strain: Not on  file  Food Insecurity: No Food Insecurity (08/28/2022)   Hunger Vital Sign    Worried About Running Out of Food in the Last Year: Never true    Ran Out of Food in the Last Year: Never true  Transportation Needs: No Transportation Needs (08/28/2022)   PRAPARE - Administrator, Civil Service (Medical): No    Lack of Transportation (Non-Medical): No  Physical Activity: Not on file  Stress: Not on file  Social Connections: Not on file  Intimate Partner Violence: Not At Risk (08/28/2022)   Humiliation, Afraid, Rape, and Kick questionnaire    Fear of Current or Ex-Partner: No    Emotionally Abused: No    Physically  Abused: No    Sexually Abused: No   Review of Systems  Constitutional:  Negative for chills and fever.  HENT:  Negative for sore throat.   Respiratory:  Negative for cough and shortness of breath.   Cardiovascular:  Negative for chest pain, palpitations and leg swelling.  Gastrointestinal:  Negative for abdominal pain, blood in stool, constipation, diarrhea, nausea and vomiting.  Genitourinary:  Negative for dysuria and hematuria.  Musculoskeletal:  Negative for myalgias.  Skin:  Negative for itching and rash.  Neurological:  Negative for dizziness and headaches.  Psychiatric/Behavioral:  Negative for depression and suicidal ideas.    Objective    BP 132/71 (BP Location: Right Arm, Patient Position: Sitting, Cuff Size: Normal)   Pulse 95   Ht 5\' 2"  (1.575 m)   Wt 177 lb (80.3 kg)   SpO2 94%   BMI 32.37 kg/m   Physical Exam Vitals reviewed.  Constitutional:      General: She is not in acute distress.    Appearance: Normal appearance. She is not toxic-appearing.  HENT:     Head: Normocephalic and atraumatic.     Right Ear: External ear normal.     Left Ear: External ear normal.     Nose: Nose normal. No congestion or rhinorrhea.     Mouth/Throat:     Mouth: Mucous membranes are moist.     Pharynx: Oropharynx is clear. No oropharyngeal exudate or posterior oropharyngeal erythema.  Eyes:     General: No scleral icterus.    Extraocular Movements: Extraocular movements intact.     Conjunctiva/sclera: Conjunctivae normal.     Pupils: Pupils are equal, round, and reactive to light.  Cardiovascular:     Rate and Rhythm: Normal rate and regular rhythm.     Pulses: Normal pulses.     Heart sounds: Normal heart sounds. No murmur heard.    No friction rub. No gallop.  Pulmonary:     Effort: Pulmonary effort is normal.     Breath sounds: Normal breath sounds. No wheezing, rhonchi or rales.  Abdominal:     General: Abdomen is flat. Bowel sounds are normal. There is no distension.      Palpations: Abdomen is soft.     Tenderness: There is no abdominal tenderness.  Musculoskeletal:        General: No swelling. Normal range of motion.     Cervical back: Normal range of motion.     Right lower leg: No edema.     Left lower leg: No edema.  Lymphadenopathy:     Cervical: No cervical adenopathy.  Skin:    General: Skin is warm and dry.     Capillary Refill: Capillary refill takes less than 2 seconds.     Coloration: Skin is not jaundiced.  Neurological:     General: No focal deficit present.     Mental Status: She is alert and oriented to person, place, and time.  Psychiatric:        Mood and Affect: Mood normal.        Behavior: Behavior normal.    Assessment & Plan:   Problem List Items Addressed This Visit       Chronic systolic congestive heart failure (HCC) - Primary   Appears euvolemic on exam today.  Normal EF on TTE from February 2024.  Previously as low as 30-35%.  She is currently prescribed Entresto 24-26 mg twice daily.  Followed by cardiology (Dr. Anne Fu).      Complete heart block (HCC)   History of complete heart block s/p pacemaker implantation in early January 2024.  Recently seen by cardiology (Dr. Ladona Ridgel) for follow-up.      Osteopenia   Previously documented history of osteopenia.  Currently on vitamin D and calcium supplementation.  She has tried Fosamax in the past and could not tolerate it.      History of CVA (cerebrovascular accident)   History of right MCA CVA in February 2024.  No residual deficits.  Currently prescribed ASA 81 mg daily and rosuvastatin 20 mg daily.  Neurology follow-up is scheduled for February 2025.      Prediabetes   A1c 5.8 on labs from February 2024.  Repeat A1c ordered today.      Return in about 6 months (around 01/18/2024).   Billie Lade, MD

## 2023-07-20 NOTE — Assessment & Plan Note (Signed)
Appears euvolemic on exam today.  Normal EF on TTE from February 2024.  Previously as low as 30-35%.  She is currently prescribed Entresto 24-26 mg twice daily.  Followed by cardiology (Dr. Anne Fu).

## 2023-07-20 NOTE — Assessment & Plan Note (Signed)
History of right MCA CVA in February 2024.  No residual deficits.  Currently prescribed ASA 81 mg daily and rosuvastatin 20 mg daily.  Neurology follow-up is scheduled for February 2025.

## 2023-07-20 NOTE — Assessment & Plan Note (Signed)
A1c 5.8 on labs from February 2024.  Repeat A1c ordered today.

## 2023-07-21 LAB — VITAMIN D 25 HYDROXY (VIT D DEFICIENCY, FRACTURES): Vit D, 25-Hydroxy: 26.9 ng/mL — ABNORMAL LOW (ref 30.0–100.0)

## 2023-07-21 LAB — HEMOGLOBIN A1C
Est. average glucose Bld gHb Est-mCnc: 126 mg/dL
Hgb A1c MFr Bld: 6 % — ABNORMAL HIGH (ref 4.8–5.6)

## 2023-07-21 LAB — LIPID PANEL
Chol/HDL Ratio: 2 {ratio} (ref 0.0–4.4)
Cholesterol, Total: 131 mg/dL (ref 100–199)
HDL: 65 mg/dL (ref >39)
LDL Chol Calc (NIH): 49 mg/dL (ref 0–99)
Triglycerides: 89 mg/dL (ref 0–149)
VLDL Cholesterol Cal: 17 mg/dL (ref 5–40)

## 2023-07-21 LAB — CBC WITH DIFFERENTIAL/PLATELET
Basophils Absolute: 0.1 10*3/uL (ref 0.0–0.2)
Basos: 1 %
EOS (ABSOLUTE): 0.1 10*3/uL (ref 0.0–0.4)
Eos: 1 %
Hematocrit: 37.8 % (ref 34.0–46.6)
Hemoglobin: 12.2 g/dL (ref 11.1–15.9)
Immature Grans (Abs): 0 10*3/uL (ref 0.0–0.1)
Immature Granulocytes: 0 %
Lymphocytes Absolute: 1.8 10*3/uL (ref 0.7–3.1)
Lymphs: 20 %
MCH: 29.1 pg (ref 26.6–33.0)
MCHC: 32.3 g/dL (ref 31.5–35.7)
MCV: 90 fL (ref 79–97)
Monocytes Absolute: 0.6 10*3/uL (ref 0.1–0.9)
Monocytes: 7 %
Neutrophils Absolute: 6.7 10*3/uL (ref 1.4–7.0)
Neutrophils: 71 %
Platelets: 231 10*3/uL (ref 150–450)
RBC: 4.19 x10E6/uL (ref 3.77–5.28)
RDW: 12.3 % (ref 11.7–15.4)
WBC: 9.4 10*3/uL (ref 3.4–10.8)

## 2023-07-21 LAB — CMP14+EGFR
ALT: 19 [IU]/L (ref 0–32)
AST: 28 [IU]/L (ref 0–40)
Albumin: 4.1 g/dL (ref 3.7–4.7)
Alkaline Phosphatase: 61 [IU]/L (ref 44–121)
BUN/Creatinine Ratio: 19 (ref 12–28)
BUN: 18 mg/dL (ref 8–27)
Bilirubin Total: 0.3 mg/dL (ref 0.0–1.2)
CO2: 24 mmol/L (ref 20–29)
Calcium: 9.3 mg/dL (ref 8.7–10.3)
Chloride: 104 mmol/L (ref 96–106)
Creatinine, Ser: 0.96 mg/dL (ref 0.57–1.00)
Globulin, Total: 2.9 g/dL (ref 1.5–4.5)
Glucose: 111 mg/dL — ABNORMAL HIGH (ref 70–99)
Potassium: 4.4 mmol/L (ref 3.5–5.2)
Sodium: 142 mmol/L (ref 134–144)
Total Protein: 7 g/dL (ref 6.0–8.5)
eGFR: 58 mL/min/{1.73_m2} — ABNORMAL LOW (ref >59)

## 2023-07-21 LAB — B12 AND FOLATE PANEL
Folate: 19.5 ng/mL (ref >3.0)
Vitamin B-12: 614 pg/mL (ref 232–1245)

## 2023-07-21 LAB — TSH+FREE T4
Free T4: 0.95 ng/dL (ref 0.82–1.77)
TSH: 3.93 u[IU]/mL (ref 0.450–4.500)

## 2023-07-23 ENCOUNTER — Other Ambulatory Visit (HOSPITAL_COMMUNITY): Payer: Self-pay | Admitting: Internal Medicine

## 2023-07-23 DIAGNOSIS — Z1231 Encounter for screening mammogram for malignant neoplasm of breast: Secondary | ICD-10-CM

## 2023-07-27 ENCOUNTER — Ambulatory Visit (INDEPENDENT_AMBULATORY_CARE_PROVIDER_SITE_OTHER): Payer: PPO

## 2023-07-27 DIAGNOSIS — I429 Cardiomyopathy, unspecified: Secondary | ICD-10-CM | POA: Diagnosis not present

## 2023-07-27 LAB — CUP PACEART REMOTE DEVICE CHECK
Battery Remaining Longevity: 154 mo
Battery Voltage: 3.1 V
Brady Statistic AP VP Percent: 0.75 %
Brady Statistic AP VS Percent: 0.1 %
Brady Statistic AS VP Percent: 30.03 %
Brady Statistic AS VS Percent: 69.13 %
Brady Statistic RA Percent Paced: 0.84 %
Brady Statistic RV Percent Paced: 30.78 %
Date Time Interrogation Session: 20250103055315
Implantable Lead Connection Status: 753985
Implantable Lead Connection Status: 753985
Implantable Lead Implant Date: 20240103
Implantable Lead Implant Date: 20240103
Implantable Lead Location: 753857
Implantable Lead Location: 753859
Implantable Lead Model: 3830
Implantable Lead Model: 5076
Implantable Pulse Generator Implant Date: 20240103
Lead Channel Impedance Value: 304 Ohm
Lead Channel Impedance Value: 342 Ohm
Lead Channel Impedance Value: 361 Ohm
Lead Channel Impedance Value: 475 Ohm
Lead Channel Pacing Threshold Amplitude: 0.5 V
Lead Channel Pacing Threshold Amplitude: 1.125 V
Lead Channel Pacing Threshold Pulse Width: 0.4 ms
Lead Channel Pacing Threshold Pulse Width: 0.4 ms
Lead Channel Sensing Intrinsic Amplitude: 2.375 mV
Lead Channel Sensing Intrinsic Amplitude: 2.375 mV
Lead Channel Sensing Intrinsic Amplitude: 8.625 mV
Lead Channel Sensing Intrinsic Amplitude: 8.625 mV
Lead Channel Setting Pacing Amplitude: 1.5 V
Lead Channel Setting Pacing Amplitude: 2.5 V
Lead Channel Setting Pacing Pulse Width: 0.4 ms
Lead Channel Setting Sensing Sensitivity: 1.2 mV
Zone Setting Status: 755011

## 2023-07-29 ENCOUNTER — Encounter: Payer: Self-pay | Admitting: Internal Medicine

## 2023-08-01 ENCOUNTER — Ambulatory Visit (HOSPITAL_COMMUNITY)
Admission: RE | Admit: 2023-08-01 | Discharge: 2023-08-01 | Disposition: A | Discharge status: Home / Self Care | Payer: PPO | Source: Ambulatory Visit | Attending: Internal Medicine | Admitting: Internal Medicine

## 2023-08-01 DIAGNOSIS — Z1231 Encounter for screening mammogram for malignant neoplasm of breast: Secondary | ICD-10-CM | POA: Diagnosis not present

## 2023-09-03 NOTE — Progress Notes (Signed)
 Remote pacemaker transmission.

## 2023-09-11 ENCOUNTER — Telehealth: Payer: Self-pay | Admitting: Neurology

## 2023-09-11 NOTE — Telephone Encounter (Signed)
Pt called to reschedule appointment due to flu outbreak and do not want to catch the flu.

## 2023-09-18 ENCOUNTER — Ambulatory Visit: Payer: PPO | Admitting: Neurology

## 2023-09-24 ENCOUNTER — Other Ambulatory Visit: Payer: Self-pay | Admitting: Cardiology

## 2023-09-26 ENCOUNTER — Ambulatory Visit: Payer: Self-pay | Admitting: Internal Medicine

## 2023-09-26 DIAGNOSIS — T148XXA Other injury of unspecified body region, initial encounter: Secondary | ICD-10-CM | POA: Diagnosis not present

## 2023-09-26 DIAGNOSIS — R600 Localized edema: Secondary | ICD-10-CM | POA: Diagnosis not present

## 2023-09-26 NOTE — Telephone Encounter (Signed)
 Chief Complaint: Left ankle swelling Symptoms: slight redness to the ankle, swelling going up left foot, pain 5/10 Frequency: Onset yesterday afternoon Pertinent Negatives: Patient denies other symptoms Disposition: [] ED /[] Urgent Care (no appt availability in office) / [x] Appointment(In office/virtual)/ []  [facility]/ [] Home Care/ [] Refused Recommended Disposition /[] [facility]/ []  Follow-up with PCP Additional Notes: Patient says she didn't injure her left ankle that she's aware of that caused the swelling. She says she's not walking right due to the swelling and pain, appears to have some redness to the ankle. Advised no availability with PCP, agrees to be scheduled with any provider. First available is on Friday, scheduled with Tenna Child, NP.

## 2023-09-26 NOTE — Telephone Encounter (Signed)
 Copied from CRM [id number]. Topic: Clinical - Red Word Triage >> Sep 26, 2023 11:19 AM Elle L wrote: Red Word that prompted transfer to Nurse Triage: The patient states that her ankle is swollen and red and the rest of her foot is beginning to swell. She is concerned about it being a blood clot as she does not recall hurting her ankle. Reason for Disposition  MILD or MODERATE ankle swelling (e.g., can't move joint normally, can't do usual activities) (Exceptions: Itchy, localized swelling; swelling is chronic.)  Answer Assessment - Initial Assessment Questions 1. LOCATION: "Which ankle is swollen?" "Where is the swelling?"     Left ankle going up my foot 2. ONSET: "When did the swelling start?"     Late yesterday afternoon 3. SWELLING: "How bad is the swelling?" Or, "How large is it?" (e.g., mild, moderate, severe; size of localized swelling)    - NONE: No joint swelling.   - LOCALIZED: Localized; small area of puffy or swollen skin (e.g., insect bite, skin irritation).   - MILD: Joint looks or feels mildly swollen or puffy.   - MODERATE: Swollen; interferes with normal activities (e.g., work or school); decreased range of movement; may be limping.   - SEVERE: Very swollen; can't move swollen joint at all; limping a lot or unable to walk.     Moderate 4. PAIN: "Is there any pain?" If Yes, ask: "How bad is it?" (Scale 1-10; or mild, moderate, severe)   - NONE (0): no pain.   - MILD (1-3): doesn't interfere with normal activities.    - MODERATE (4-7): interferes with normal activities (e.g., work or school) or awakens from sleep, limping.    - SEVERE (8-10): excruciating pain, unable to do any normal activities, unable to walk.      5 5. CAUSE: "What do you think caused the ankle swelling?"     I don't know 6. OTHER SYMPTOMS: "Do you have any other symptoms?" (e.g., fever, chest pain, difficulty breathing, calf pain)     Appears to have some redness to the ankle  Protocols used: Ankle  Swelling-A-AH

## 2023-09-28 ENCOUNTER — Ambulatory Visit: Payer: Self-pay | Admitting: Family Medicine

## 2023-09-28 ENCOUNTER — Encounter: Payer: Self-pay | Admitting: Family Medicine

## 2023-09-28 VITALS — BP 144/79 | HR 79 | Ht 62.0 in | Wt 174.1 lb

## 2023-09-28 DIAGNOSIS — M25472 Effusion, left ankle: Secondary | ICD-10-CM

## 2023-09-28 DIAGNOSIS — H52203 Unspecified astigmatism, bilateral: Secondary | ICD-10-CM | POA: Diagnosis not present

## 2023-09-28 DIAGNOSIS — Z961 Presence of intraocular lens: Secondary | ICD-10-CM | POA: Diagnosis not present

## 2023-09-28 DIAGNOSIS — H04123 Dry eye syndrome of bilateral lacrimal glands: Secondary | ICD-10-CM | POA: Diagnosis not present

## 2023-09-28 DIAGNOSIS — H43813 Vitreous degeneration, bilateral: Secondary | ICD-10-CM | POA: Diagnosis not present

## 2023-09-28 DIAGNOSIS — H40023 Open angle with borderline findings, high risk, bilateral: Secondary | ICD-10-CM | POA: Diagnosis not present

## 2023-09-28 NOTE — Progress Notes (Signed)
 Established Patient Office Visit   Subjective  Patient ID: Marisa Livingston, female    DOB: Jun 29, 1936  Age: 88 y.o. MRN: [id number]  Chief Complaint  Patient presents with   Acute Visit    Red swollen left ankle, started Tuesday afternoon. Pt. Visited [facility] on Wed 03/05 report from their findings will be w/ [facility].     She  has a past medical history of Allergic rhinitis, Hyperlipidemia, Insomnia, LBBB (left bundle branch block), Osteopenia, and Stroke (HCC) (08/28/2022).  Patient reports swelling in the left ankle, first noticed last Tuesday. The swelling has been constant and is described as mild in severity. Associated symptoms include pain and redness around the swollen area, with no warmth, bruising, or difficulty bearing weight. The swelling is not aggravated by standing or walking for long periods. Patient denies recent physical activities or exercises that could contribute to the swelling. Medical history is significant for congestive heart failure (CHF) and arthritis. Patient is currently taking medications for CHF with no recent changes or use of steroids, calcium channel blockers, or NSAIDs. There is no recent travel or illness reported. Patient had xray done on [facility].    Review of Systems  Constitutional:  Negative for chills and fever.  Respiratory:  Negative for shortness of breath.   Cardiovascular:  Negative for chest pain.  Musculoskeletal:  Positive for myalgias.  Neurological:  Negative for dizziness and headaches.      Objective:     BP (!) 144/79   Pulse 79   Ht 5\' 2"  (1.575 m)   Wt 174 lb 1.9 oz (79 kg)   SpO2 97%   BMI 31.85 kg/m  BP Readings from Last 3 Encounters:  09/28/23 (!) 144/79  07/20/23 132/71  04/02/23 135/80      Physical Exam Vitals reviewed.  Constitutional:      General: She is not in acute distress.    Appearance: Normal appearance. She is not ill-appearing, toxic-appearing or diaphoretic.  HENT:     Head:  Normocephalic.  Eyes:     General:        Right eye: No discharge.        Left eye: No discharge.     Conjunctiva/sclera: Conjunctivae normal.  Cardiovascular:     Rate and Rhythm: Normal rate.     Pulses: Normal pulses.     Heart sounds: Normal heart sounds.  Pulmonary:     Effort: Pulmonary effort is normal. No respiratory distress.     Breath sounds: Normal breath sounds.  Musculoskeletal:        General: Tenderness present. Normal range of motion.     Left lower leg: Edema present.  Skin:    General: Skin is warm and dry.     Capillary Refill: Capillary refill takes less than 2 seconds.  Neurological:     Mental Status: She is alert.     Coordination: Coordination normal.     Gait: Gait normal.  Psychiatric:        Mood and Affect: Mood normal.        Behavior: Behavior normal.      No results found for any visits on 09/28/23.  The ASCVD Risk score (Arnett DK, et al., 2019) failed to calculate for the following reasons:   The 2019 ASCVD risk score is only valid for ages 28 to 76   Risk score cannot be calculated because patient has a medical history suggesting prior/existing ASCVD    Assessment & Plan:  Left ankle swelling Assessment & Plan: Negative Homan's sign Reviewed Xray results with patient: a small new fracture on the outer ankle bone (lateral malleolus) and an older small fracture on the inner ankle bone (medial malleolus). Advise icing, stretching and modifying activities that cause pain. Can take tylenol for pain management. Discussed theThe RICE method: R: Rest the painful area for a few days. I: Ice the area for 20 minutes at a time to relieve inflammation. C: Compress the area with a soft wrap to reduce swelling. E: Elevate the area by putting the foot on a few pillows.      Return if symptoms worsen or fail to improve.   Cruzita Lederer Newman Nip, FNP

## 2023-09-28 NOTE — Assessment & Plan Note (Signed)
 Negative Homan's sign Reviewed Xray results with patient: a small new fracture on the outer ankle bone (lateral malleolus) and an older small fracture on the inner ankle bone (medial malleolus). Advise icing, stretching and modifying activities that cause pain. Can take tylenol for pain management. Discussed theThe RICE method: R: Rest the painful area for a few days. I: Ice the area for 20 minutes at a time to relieve inflammation. C: Compress the area with a soft wrap to reduce swelling. E: Elevate the area by putting the foot on a few pillows.

## 2023-09-28 NOTE — Patient Instructions (Addendum)
        Great to see you today.  I have refilled the medication(s) we provide.   Your X-ray shows small bone fragments near your ankle bones. This could be from a past injury, small extra bones you're born with, or normal wear and tear. Since you're having pain, In addition,  a small new fracture on the outer ankle bone (lateral malleolus) and an older small fracture on the inner ankle bone (medial malleolus).  I advise on the following:  Treatment Plan: 1. Conservative Management (if asymptomatic or mild symptoms): Rest, ice, compression, and elevation (RICE) for pain and swelling. Tylenol for pain relief. 2. Immobilization -Ankle brace or walking boot for 2-4 weeks if there is significant pain or instability. 3. Follow up with Orthopedic if pain worsens    - Please take medications as prescribed. - Follow up with your primary health provider if any health concerns arises. - If symptoms worsen please contact your primary care provider and/or visit the emergency department.

## 2023-10-24 ENCOUNTER — Telehealth: Payer: Self-pay | Admitting: Neurology

## 2023-10-24 NOTE — Telephone Encounter (Signed)
 LVM and sent mychart msg informing pt of need to reschedule 03/11/24 appt - MD out

## 2023-10-26 ENCOUNTER — Ambulatory Visit (INDEPENDENT_AMBULATORY_CARE_PROVIDER_SITE_OTHER): Payer: PPO

## 2023-10-26 DIAGNOSIS — I429 Cardiomyopathy, unspecified: Secondary | ICD-10-CM | POA: Diagnosis not present

## 2023-10-26 LAB — CUP PACEART REMOTE DEVICE CHECK
Battery Remaining Longevity: 151 mo
Battery Voltage: 3.06 V
Brady Statistic AP VP Percent: 11.49 %
Brady Statistic AP VS Percent: 1.03 %
Brady Statistic AS VP Percent: 31.9 %
Brady Statistic AS VS Percent: 55.58 %
Brady Statistic RA Percent Paced: 12.5 %
Brady Statistic RV Percent Paced: 43.39 %
Date Time Interrogation Session: 20250403224846
Implantable Lead Connection Status: 753985
Implantable Lead Connection Status: 753985
Implantable Lead Implant Date: 20240103
Implantable Lead Implant Date: 20240103
Implantable Lead Location: 753857
Implantable Lead Location: 753859
Implantable Lead Model: 3830
Implantable Lead Model: 5076
Implantable Pulse Generator Implant Date: 20240103
Lead Channel Impedance Value: 323 Ohm
Lead Channel Impedance Value: 361 Ohm
Lead Channel Impedance Value: 380 Ohm
Lead Channel Impedance Value: 494 Ohm
Lead Channel Pacing Threshold Amplitude: 0.5 V
Lead Channel Pacing Threshold Amplitude: 1.25 V
Lead Channel Pacing Threshold Pulse Width: 0.4 ms
Lead Channel Pacing Threshold Pulse Width: 0.4 ms
Lead Channel Sensing Intrinsic Amplitude: 2.25 mV
Lead Channel Sensing Intrinsic Amplitude: 2.25 mV
Lead Channel Sensing Intrinsic Amplitude: 8 mV
Lead Channel Sensing Intrinsic Amplitude: 8 mV
Lead Channel Setting Pacing Amplitude: 1.5 V
Lead Channel Setting Pacing Amplitude: 2.5 V
Lead Channel Setting Pacing Pulse Width: 0.4 ms
Lead Channel Setting Sensing Sensitivity: 1.2 mV
Zone Setting Status: 755011

## 2023-10-28 ENCOUNTER — Encounter: Payer: Self-pay | Admitting: Internal Medicine

## 2023-11-16 DIAGNOSIS — Z961 Presence of intraocular lens: Secondary | ICD-10-CM | POA: Diagnosis not present

## 2023-11-16 DIAGNOSIS — H401131 Primary open-angle glaucoma, bilateral, mild stage: Secondary | ICD-10-CM | POA: Diagnosis not present

## 2023-11-21 ENCOUNTER — Other Ambulatory Visit: Payer: Self-pay | Admitting: Cardiology

## 2023-11-26 ENCOUNTER — Ambulatory Visit: Payer: PPO | Attending: Internal Medicine | Admitting: Internal Medicine

## 2023-11-26 ENCOUNTER — Encounter: Payer: Self-pay | Admitting: Internal Medicine

## 2023-11-26 VITALS — BP 134/88 | HR 92 | Ht 62.0 in | Wt 172.8 lb

## 2023-11-26 DIAGNOSIS — I429 Cardiomyopathy, unspecified: Secondary | ICD-10-CM | POA: Diagnosis not present

## 2023-11-26 DIAGNOSIS — I5022 Chronic systolic (congestive) heart failure: Secondary | ICD-10-CM | POA: Diagnosis not present

## 2023-11-26 LAB — CUP PACEART INCLINIC DEVICE CHECK
Brady Statistic RA Percent Paced: 7 %
Brady Statistic RV Percent Paced: 37.5 %
Date Time Interrogation Session: 20250505093341
Implantable Lead Connection Status: 753985
Implantable Lead Connection Status: 753985
Implantable Lead Implant Date: 20240103
Implantable Lead Implant Date: 20240103
Implantable Lead Location: 753857
Implantable Lead Location: 753859
Implantable Lead Model: 3830
Implantable Lead Model: 5076
Implantable Pulse Generator Implant Date: 20240103

## 2023-11-26 NOTE — Patient Instructions (Signed)
 Medication Instructions:  Your physician recommends that you continue on your current medications as directed. Please refer to the Current Medication list given to you today.   Labwork: None today  Testing/Procedures: None today  Follow-Up: 1 year  Any Other Special Instructions Will Be Listed Below (If Applicable).  If you need a refill on your cardiac medications before your next appointment, please call your pharmacy.

## 2023-11-26 NOTE — Progress Notes (Signed)
 HPI Mrs. Maly returns today for followup. She is a pleasant 88 yo woman with a h/o HTN, stroke and heart block who underwent insertion of a DDD PM over a year ago. In the interim, she notes that she has had a stroke with no residual. She was placed on ASA and plavix . She has not had any sustained atrial fib. No syncope. Her bp is better but still a little elevated. She has had transient LV dysfunction with normalization.  Allergies  Allergen Reactions   Codeine Nausea Only   Levaquin [Levofloxacin In D5w]     Sick      Current Outpatient Medications  Medication Sig Dispense Refill   acetaminophen  (TYLENOL ) 325 MG tablet Take 325-650 mg by mouth every 6 (six) hours as needed for mild pain, fever or headache.     aspirin  EC 81 MG tablet Take 1 tablet (81 mg total) by mouth daily. Swallow whole. 30 tablet 12   Calcium  Citrate (CITRACAL PO) Take 1 tablet by mouth daily.     Multiple Vitamins-Minerals (MULTIVITAMIN WITH MINERALS) tablet Take 1 tablet by mouth daily.     rosuvastatin  (CRESTOR ) 20 MG tablet TAKE ONE TABLET (20MG  TOTAL) BY MOUTH DAILY 90 tablet 1   sacubitril -valsartan  (ENTRESTO ) 24-26 MG Take 1 tablet by mouth 2 (two) times daily. Please keep scheduled appointment for future refills. Thank you. 60 tablet 0   No current facility-administered medications for this visit.     Past Medical History:  Diagnosis Date   Allergic rhinitis    Hyperlipidemia    Insomnia    LBBB (left bundle branch block)    Osteopenia    Stroke (HCC) 08/28/2022    ROS:   All systems reviewed and negative except as noted in the HPI.   Past Surgical History:  Procedure Laterality Date   COLONOSCOPY N/A 09/02/2015   Procedure: COLONOSCOPY;  Surgeon: Ruby Corporal, MD;  [facility];  Service: Endoscopy;  Laterality: N/A;  1025 - moved to 2/9 @ 8:30 - Ann notified pt   PACEMAKER IMPLANT N/A 07/26/2022   Procedure: PACEMAKER IMPLANT;  Surgeon: Tammie Fall, MD;  Location:  [facility] INVASIVE CV LAB;  Service: Cardiovascular;  Laterality: N/A;   POLYPECTOMY N/A 09/02/2015   Procedure: POLYPECTOMY;  Surgeon: Ruby Corporal, MD;  [facility];  Service: Endoscopy;  Laterality: N/A;  Hepatic Flexure Polyp     Family History  Problem Relation Age of Onset   Heart Problems Mother        had a pacemaker   Cancer Brother      Social History   Socioeconomic History   Marital status: Married    Spouse name: Not on file   Number of children: Not on file   Years of education: Not on file   Highest education level: Not on file  Occupational History   Not on file  Tobacco Use   Smoking status: Never   Smokeless tobacco: Never  Vaping Use   Vaping status: Never Used  Substance and Sexual Activity   Alcohol use: No   Drug use: No   Sexual activity: Not Currently    Birth control/protection: Post-menopausal  Other Topics Concern   Not on file  Social History Narrative   Lives in alone in [place]   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[employer]   Social Drivers of Corporate investment banker Strain: Not on file  Food Insecurity: No Food Insecurity (08/28/2022)  Hunger Vital Sign    Worried About Running Out of Food in the Last Year: Never true    Ran Out of Food in the Last Year: Never true  Transportation Needs: No Transportation Needs (08/28/2022)   PRAPARE - Administrator, Civil Service (Medical): No    Lack of Transportation (Non-Medical): No  Physical Activity: Not on file  Stress: Not on file  Social Connections: Not on file  Intimate Partner Violence: Not At Risk (08/28/2022)   Humiliation, Afraid, Rape, and Kick questionnaire    Fear of Current or Ex-Partner: No    Emotionally Abused: No    Physically Abused: No    Sexually Abused: No     BP 134/88   Pulse 92   Ht 5\' 2"  (1.575 m)   Wt 172 lb 12.8 oz (78.4 kg)   SpO2 97%   BMI 31.61 kg/m   Physical Exam:  Well appearing NAD HEENT: Unremarkable Neck:   No JVD, no thyromegally Lymphatics:  No adenopathy Back:  No CVA tenderness Lungs:  Clear with no wheezes HEART:  Regular rate rhythm, no murmurs, no rubs, no clicks Abd:  soft, positive bowel sounds, no organomegally, no rebound, no guarding Ext:  2 plus pulses, no edema, no cyanosis, no clubbing Skin:  No rashes no nodules Neuro:  CN II through XII intact, motor grossly intact  EKG - NSR with nonspecific IVCD.   DEVICE  Normal device function.  See PaceArt for details.   Assess/Plan:  High grade heart block - she is maintaining NSR and is pacing about 34% of the time. HTN - her sbp is elevated in the office but is better at home.  PPM - her Medtronic DDD PM is working normally. We will recheck in several months.   Pete Brand Glover Capano,MD

## 2023-12-03 NOTE — Progress Notes (Signed)
 Remote pacemaker transmission.

## 2023-12-05 ENCOUNTER — Encounter: Payer: Self-pay | Admitting: Cardiology

## 2023-12-05 ENCOUNTER — Ambulatory Visit: Payer: PPO | Attending: Cardiology | Admitting: Cardiology

## 2023-12-05 VITALS — BP 149/75 | HR 90 | Ht 62.0 in | Wt 173.0 lb

## 2023-12-05 DIAGNOSIS — I5022 Chronic systolic (congestive) heart failure: Secondary | ICD-10-CM

## 2023-12-05 DIAGNOSIS — I447 Left bundle-branch block, unspecified: Secondary | ICD-10-CM | POA: Diagnosis not present

## 2023-12-05 DIAGNOSIS — I442 Atrioventricular block, complete: Secondary | ICD-10-CM | POA: Diagnosis not present

## 2023-12-05 DIAGNOSIS — E785 Hyperlipidemia, unspecified: Secondary | ICD-10-CM | POA: Diagnosis not present

## 2023-12-05 MED ORDER — ENTRESTO 24-26 MG PO TABS: 1.0000 | ORAL_TABLET | Freq: Two times a day (BID) | ORAL | 3 refills | Status: AC

## 2023-12-05 MED ORDER — ROSUVASTATIN CALCIUM 20 MG PO TABS: 20.0000 mg | ORAL_TABLET | Freq: Every day | ORAL | 3 refills | Status: AC

## 2023-12-05 NOTE — Patient Instructions (Signed)
 Medication Instructions:  The current medical regimen is effective;  continue present plan and medications.  *If you need a refill on your cardiac medications before your next appointment, please call your pharmacy*  Follow-Up: At [facility], you and your health needs are our priority.  As part of our continuing mission to provide you with exceptional heart care, our providers are all part of one team.  This team includes your primary Cardiologist (physician) and Advanced Practice Providers or APPs (Physician Assistants and Nurse Practitioners) who all work together to provide you with the care you need, when you need it.  Your next appointment:   1 year(s)  Provider:   One of our Advanced Practice Providers (APPs): Melita Springer, PA-C  Friddie Jetty, NP Evaline Hill, NP  Theotis Flake, PA-C Lawana Pray, NP  Willis Harter, PA-C Lovette Rud, PA-C  [place], PA-C Ernest Dick, NP  Marlana Silvan, NP Marcie Sever, PA-C  Laquita Plant, PA-C    Dayna Dunn, PA-C  Scott Weaver, PA-C Palmer Bobo, NP Katlyn West, NP Callie Goodrich, PA-C  Evan Williams, PA-C Sheng Haley, PA-C  Xika Zhao, NP Kathleen Johnson, PA-C   Then, Dorothye Gathers, MD will plan to see you again in 2 year(s).    We recommend signing up for the patient portal called "MyChart".  Sign up information is provided on this After Visit Summary.  MyChart is used to connect with patients for Virtual Visits (Telemedicine).  Patients are able to view lab/test results, encounter notes, upcoming appointments, etc.  Non-urgent messages can be sent to your provider as well.   To learn more about what you can do with MyChart, go to [email or web address].

## 2023-12-06 NOTE — Progress Notes (Signed)
 Cardiology Office Note:  .   Date:  12/06/2023  ID:  Dareen Ebbing, DOB 09/17/1935, MRN [id number] PCP: Tobi Fortes, MD  [facility] Providers Cardiologist:  Dorothye Gathers, MD     History of Present Illness: .   Marisa Livingston is a 88 y.o. female Discussed the use of AI scribe software for clinical note transcription with the patient, who gave verbal consent to proceed.  History of Present Illness Marisa Livingston is an 88 year old female with a history of stroke, heart block, and pacemaker who presents for follow-up.  She has a history of stroke with no residual symptoms and is currently taking aspirin  81 mg daily. She experiences occasional difficulty finding words, which she attributes to her age.  Her cardiac history includes a high-grade heart block managed with a pacemaker, pacing 34% of the time. Her EKGs show normal sinus rhythm with a left bundle branch block-like appearance. Her last echocardiogram on August 29, 2022, indicated an ejection fraction of 55-60%, improved from a previous 30-35% with chronic systolic heart failure. She is on Entresto  24/26 mg twice daily, which helps maintain her blood pressure around 135/75-80 mmHg at home.  No chest pain or shortness of breath. A stress test in 2022 showed no ischemia, and a long-term monitor in 2022 revealed sinus rhythm with rare PVCs and PACs.  Her LDL is 49 on rosuvastatin  20 mg daily. Her hemoglobin A1c is 6.0, hemoglobin is 12.2, and creatinine is 0.9.    Studies Reviewed: Aaron Aas    EKG Interpretation Date/Time:  Wednesday Dec 05 2023 09:15:13 EDT Ventricular Rate:  78 PR Interval:  174 QRS Duration:  130 QT Interval:  404 QTC Calculation: 460 R Axis:   36  Text Interpretation: Normal sinus rhythm Left bundle branch block When compared with ECG of 26-Nov-2023 09:19, T wave inversion no longer evident in Inferior leads Confirmed by Dorothye Gathers ([id number]) on 12/05/2023 9:38:02 AM    Results LABS LDL:  49 HbA1c: 6.0 Hb: 12.2 Cr: 0.9 ALT: 19  DIAGNOSTIC EKG: Normal sinus rhythm with left bundle branch block appearance (12/05/2023) Echocardiogram: EF 55-60% (08/29/2022) Stress test: No ischemia, overall low risk (2022) Long term monitor: Sinus rhythm, rare PVCs and PACs (2022)  Risk Assessment/Calculations:           Physical Exam:   VS:  BP (!) 149/75   Pulse 90   Ht 5\' 2"  (1.575 m)   Wt 173 lb (78.5 kg)   SpO2 97%   BMI 31.64 kg/m    Wt Readings from Last 3 Encounters:  12/05/23 173 lb (78.5 kg)  11/26/23 172 lb 12.8 oz (78.4 kg)  09/28/23 174 lb 1.9 oz (79 kg)    GEN: Well nourished, well developed in no acute distress NECK: No JVD; No carotid bruits CARDIAC: RRR, no murmurs, no rubs, no gallops RESPIRATORY:  Clear to auscultation without rales, wheezing or rhonchi  ABDOMEN: Soft, non-tender, non-distended EXTREMITIES:  No edema; No deformity   ASSESSMENT AND PLAN: .    Assessment and Plan Assessment & Plan Chronic systolic heart failure Chronic systolic heart failure with improved ejection fraction from 30-35% to 55-60% as per the latest echocardiogram. She is asymptomatic with effective management using sacubitril -valsartan  (Entresto ) for cardiac function and blood pressure control. Home blood pressure readings are satisfactory. - Continue sacubitril -valsartan  (Entresto ) 24-26 mg oral bid  Complete heart block with pacemaker Complete heart block with a normally functioning pacemaker, pacing 34% of the time. She is  asymptomatic with EKG showing normal sinus rhythm and left bundle branch block appearance. - Continue monitoring pacemaker function as per current protocol  Left bundle branch block Left bundle branch block consistent with previous findings. No new symptoms or changes in cardiac function.  Elevated blood pressure Blood pressure is mildly elevated at 149/75 mmHg in the office, but home readings are controlled at approximately 135/75-80 mmHg. Current  antihypertensive therapy is effective. - Continue current antihypertensive regimen - Monitor blood pressure at home regularly  Hyperlipidemia Hyperlipidemia is well-controlled with rosuvastatin . LDL is 49 mg/dL, indicating excellent control. - Continue rosuvastatin  20 mg oral daily  Pre-diabetes Pre-diabetes with hemoglobin A1c at 6.0%. Lifestyle modifications are advised. - Advise monitoring of dietary sugar intake and lifestyle modifications  Stroke Stroke without residual symptoms. She is on aspirin  for secondary prevention and no longer on clopidogrel  (Plavix ). - Continue aspirin  81 mg oral daily - Monitor for any new neurological symptoms         Signed, Dorothye Gathers, MD

## 2024-01-02 ENCOUNTER — Encounter: Payer: Self-pay | Admitting: Internal Medicine

## 2024-01-02 ENCOUNTER — Ambulatory Visit (INDEPENDENT_AMBULATORY_CARE_PROVIDER_SITE_OTHER): Admitting: Internal Medicine

## 2024-01-02 VITALS — BP 136/76 | HR 86 | Ht 62.0 in | Wt 171.0 lb

## 2024-01-02 DIAGNOSIS — R7303 Prediabetes: Secondary | ICD-10-CM | POA: Diagnosis not present

## 2024-01-02 DIAGNOSIS — M8580 Other specified disorders of bone density and structure, unspecified site: Secondary | ICD-10-CM

## 2024-01-02 DIAGNOSIS — Z8673 Personal history of transient ischemic attack (TIA), and cerebral infarction without residual deficits: Secondary | ICD-10-CM

## 2024-01-02 DIAGNOSIS — I5022 Chronic systolic (congestive) heart failure: Secondary | ICD-10-CM

## 2024-01-02 DIAGNOSIS — N1831 Chronic kidney disease, stage 3a: Secondary | ICD-10-CM

## 2024-01-02 DIAGNOSIS — E785 Hyperlipidemia, unspecified: Secondary | ICD-10-CM | POA: Diagnosis not present

## 2024-01-02 NOTE — Progress Notes (Signed)
 Established Patient Office Visit  Subjective   Patient ID: Marisa Livingston, female    DOB: 1936/03/19  Age: 88 y.o. MRN: [id number]  Chief Complaint  Patient presents with   Care Management    Six month follow up    Marisa Livingston returns to care today for routine follow-up.  She was last evaluated by me in December 2024 as a new patient presenting to establish care.  No medication changes were made at that time, repeat labs ordered, and 21-month follow-up arranged.  She has been seen by cardiology for follow-up in the interim.  Ac[facility] visit at [facility] on 3/7 endorsing left lateral ankle swelling in the setting of a lateral malleolus fracture.  Conservative treatment measures reviewed.  There have otherwise been no acute interval events.  Today she reports feeling well and has no acute concerns to discuss.  Past Medical History:  Diagnosis Date   Allergic rhinitis    Hyperlipidemia    Insomnia    LBBB (left bundle branch block)    Osteopenia    Stroke (HCC) 08/28/2022   Past Surgical History:  Procedure Laterality Date   COLONOSCOPY N/A 09/02/2015   Procedure: COLONOSCOPY;  Surgeon: Ruby Corporal, MD;  Location: [facility];  Service: Endoscopy;  Laterality: N/A;  1025 - moved to 2/9 @ 8:30 - Ann notified pt   PACEMAKER IMPLANT N/A 07/26/2022   Procedure: PACEMAKER IMPLANT;  Surgeon: Tammie Fall, MD;  Location[facility] INVASIVE CV LAB;  Service: Cardiovascular;  Laterality: N/A;   POLYPECTOMY N/A 09/02/2015   Procedure: POLYPECTOMY;  Surgeon: Ruby Corporal, MD;  Location: [facility];  Service: Endoscopy;  Laterality: N/A;  Hepatic Flexure Polyp   Social History   Tobacco Use   Smoking status: Never   Smokeless tobacco: Never  Vaping Use   Vaping status: Never Used  Substance Use Topics   Alcohol use: No   Drug use: No   Family History  Problem Relation Age of Onset   Heart Problems Mother        had a pacemaker   Cancer Brother    Allergies  Allergen Reactions   Codeine  Nausea Only   Levaquin [Levofloxacin In D5w]     Sick    Review of Systems  Constitutional:  Negative for chills and fever.  HENT:  Negative for sore throat.   Respiratory:  Negative for cough and shortness of breath.   Cardiovascular:  Negative for chest pain, palpitations and leg swelling.  Gastrointestinal:  Negative for abdominal pain, blood in stool, constipation, diarrhea, nausea and vomiting.  Genitourinary:  Negative for dysuria and hematuria.  Musculoskeletal:  Negative for myalgias.  Skin:  Negative for itching and rash.  Neurological:  Negative for dizziness and headaches.  Psychiatric/Behavioral:  Negative for depression and suicidal ideas.      Objective:     BP 136/76   Pulse 86   Ht 5' 2 (1.575 m)   Wt 171 lb (77.6 kg)   SpO2 97%   BMI 31.28 kg/m  BP Readings from Last 3 Encounters:  01/02/24 136/76  12/05/23 (!) 149/75  11/26/23 134/88   Physical Exam Vitals reviewed.  Constitutional:      General: She is not in acute distress.    Appearance: Normal appearance. She is not toxic-appearing.  HENT:     Head: Normocephalic and atraumatic.     Right Ear: External ear normal.     Left Ear: External ear normal.  Nose: Nose normal. No congestion or rhinorrhea.     Mouth/Throat:     Mouth: Mucous membranes are moist.     Pharynx: Oropharynx is clear. No oropharyngeal exudate or posterior oropharyngeal erythema.   Eyes:     General: No scleral icterus.    Extraocular Movements: Extraocular movements intact.     Conjunctiva/sclera: Conjunctivae normal.     Pupils: Pupils are equal, round, and reactive to light.    Cardiovascular:     Rate and Rhythm: Normal rate and regular rhythm.     Pulses: Normal pulses.     Heart sounds: Normal heart sounds. No murmur heard.    No friction rub. No gallop.  Pulmonary:     Effort: Pulmonary effort is normal.     Breath sounds: Normal breath sounds. No wheezing, rhonchi or rales.  Abdominal:     General:  Abdomen is flat. Bowel sounds are normal. There is no distension.     Palpations: Abdomen is soft.     Tenderness: There is no abdominal tenderness.   Musculoskeletal:        General: No swelling. Normal range of motion.     Cervical back: Normal range of motion.     Right lower leg: No edema.     Left lower leg: No edema.  Lymphadenopathy:     Cervical: No cervical adenopathy.   Skin:    General: Skin is warm and dry.     Capillary Refill: Capillary refill takes less than 2 seconds.     Coloration: Skin is not jaundiced.   Neurological:     General: No focal deficit present.     Mental Status: She is alert and oriented to person, place, and time.   Psychiatric:        Mood and Affect: Mood normal.        Behavior: Behavior normal.   Last CBC Lab Results  Component Value Date   WBC 9.4 07/20/2023   HGB 12.2 07/20/2023   HCT 37.8 07/20/2023   MCV 90 07/20/2023   MCH 29.1 07/20/2023   RDW 12.3 07/20/2023   PLT 231 07/20/2023   Last metabolic panel Lab Results  Component Value Date   GLUCOSE 111 (H) 07/20/2023   NA 142 07/20/2023   K 4.4 07/20/2023   CL 104 07/20/2023   CO2 24 07/20/2023   BUN 18 07/20/2023   CREATININE 0.96 07/20/2023   EGFR 58 (L) 07/20/2023   CALCIUM  9.3 07/20/2023   PROT 7.0 07/20/2023   ALBUMIN 4.1 07/20/2023   LABGLOB 2.9 07/20/2023   AGRATIO 1.7 12/03/2020   BILITOT 0.3 07/20/2023   ALKPHOS 61 07/20/2023   AST 28 07/20/2023   ALT 19 07/20/2023   ANIONGAP 9 08/28/2022   Last lipids Lab Results  Component Value Date   CHOL 131 07/20/2023   HDL 65 07/20/2023   LDLCALC 49 07/20/2023   TRIG 89 07/20/2023   CHOLHDL 2.0 07/20/2023   Last hemoglobin A1c Lab Results  Component Value Date   HGBA1C 6.0 (H) 07/20/2023   Last thyroid  functions Lab Results  Component Value Date   TSH 3.930 07/20/2023   Last vitamin D  Lab Results  Component Value Date   VD25OH 26.9 (L) 07/20/2023   Last vitamin B12 and Folate Lab Results   Component Value Date   VITAMINB12 614 07/20/2023   FOLATE 19.5 07/20/2023     Assessment & Plan:   Problem List Items Addressed This Visit  Chronic systolic congestive heart failure (HCC) - Primary   Stable.  Remains euvolemic on exam.  Currently prescribed Entresto .  Recently seen by cardiology for follow-up.      Osteopenia   She remains on vitamin D  and calcium  supplementation.  Vitamin D  level was insufficient on labs from December.  I have recommended increasing daily vitamin D  supplementation.      Chronic kidney disease, stage 3 (HCC)   Stable on labs from December.  She is currently prescribed Entresto .      Hyperlipidemia LDL goal <130   Lipid panel updated in December reflects excellent control with rosuvastatin  20 mg daily.      History of CVA (cerebrovascular accident)   History of right MCA CVA in February 2024.  No residual deficits.  She remains on aspirin  and rosuvastatin .  Neurology follow-up is scheduled for October.      Prediabetes   A1c remains in the prediabetic range at 6.0.  She will continue to focus on dietary changes in an effort to control her blood sugar.      Return in about 6 months (around 07/03/2024) for CPE.   Tobi Fortes, MD

## 2024-01-02 NOTE — Patient Instructions (Signed)
It was a pleasure to see you today.  Thank you for giving [place] the opportunity to be involved in your care.  Below is a brief recap of your visit and next steps.  We will plan to see you again in 6 months  Summary No medication changes today Follow up in 6 months

## 2024-01-03 ENCOUNTER — Encounter: Payer: Self-pay | Admitting: Internal Medicine

## 2024-01-03 NOTE — Assessment & Plan Note (Signed)
 History of right MCA CVA in February 2024.  No residual deficits.  She remains on aspirin  and rosuvastatin .  Neurology follow-up is scheduled for October.

## 2024-01-03 NOTE — Assessment & Plan Note (Signed)
 A1c remains in the prediabetic range at 6.0.  She will continue to focus on dietary changes in an effort to control her blood sugar.

## 2024-01-03 NOTE — Assessment & Plan Note (Signed)
 Stable on labs from December.  She is currently prescribed Entresto .

## 2024-01-03 NOTE — Assessment & Plan Note (Signed)
 Stable.  Remains euvolemic on exam.  Currently prescribed Entresto .  Recently seen by cardiology for follow-up.

## 2024-01-03 NOTE — Assessment & Plan Note (Signed)
 She remains on vitamin D  and calcium  supplementation.  Vitamin D  level was insufficient on labs from December.  I have recommended increasing daily vitamin D  supplementation.

## 2024-01-03 NOTE — Assessment & Plan Note (Signed)
 Lipid panel updated in December reflects excellent control with rosuvastatin  20 mg daily.

## 2024-01-18 ENCOUNTER — Ambulatory Visit: Payer: PPO | Admitting: Internal Medicine

## 2024-01-18 ENCOUNTER — Ambulatory Visit (INDEPENDENT_AMBULATORY_CARE_PROVIDER_SITE_OTHER): Payer: PPO | Admitting: Otolaryngology

## 2024-01-18 ENCOUNTER — Encounter (INDEPENDENT_AMBULATORY_CARE_PROVIDER_SITE_OTHER): Payer: Self-pay | Admitting: Otolaryngology

## 2024-01-18 VITALS — BP 146/77 | HR 89

## 2024-01-18 DIAGNOSIS — H903 Sensorineural hearing loss, bilateral: Secondary | ICD-10-CM

## 2024-01-18 DIAGNOSIS — H9313 Tinnitus, bilateral: Secondary | ICD-10-CM

## 2024-01-18 NOTE — Progress Notes (Unsigned)
 Patient ID: Marisa Livingston, female   DOB: 1935/09/27, 88 y.o.   MRN: [id number]  Follow up: Hearing loss, tinnitus  HPI: 6/24 The patient is an 88 year old female who presents today complaining of bilateral progressive hearing loss.  The patient was previously seen for bilateral tinnitus.  At her last visit in 2021, she was noted to have bilateral high-frequency sensorineural hearing loss, consistent with presbycusis.  The strategies to cope with tinnitus were discussed.  According to the patient, her hearing has progressively worsened.  She is having difficulty hearing other people in noisy environments.  She has never worn hearing aids.  She describes her tinnitus as a constant ringing noise.  She denies any recent otitis media or otitis externa.  She has no previous otologic surgery.   Objective Objective note General: Communicates without difficulty, well nourished, no acute distress. Head: Normocephalic, no evidence injury, no tenderness, facial buttresses intact without stepoff. Eyes: PERRL, EOMI. No scleral icterus, conjunctivae clear. Neuro: CN II exam reveals vision grossly intact.  No nystagmus at any point of gaze. Ears: Auricles well formed without lesions.  Ear canals are intact without mass or lesion.  No erythema or edema is appreciated.  The TMs are intact without fluid. Nose: External evaluation reveals normal support and skin without lesions.  Dorsum is intact.  Anterior rhinoscopy reveals healthy pink mucosa over anterior aspect of inferior turbinates and intact septum.  No purulence noted. Oral:  Oral cavity and oropharynx are intact, symmetric, without erythema or edema.  Mucosa is moist without lesions. Neck: Full range of motion without pain.  There is no significant lymphadenopathy.  No masses palpable.  Thyroid  bed within normal limits to palpation.  Parotid glands and submandibular glands equal bilaterally without mass.  Trachea is midline. Neuro:  CN 2-12 grossly intact. Gait  normal. Vestibular: No nystagmus at any point of gaze. The cerebellar examination is unremarkable.     AUDIOMETRIC TESTING: I have read and reviewed the audiometric test, which shows bilateral high-frequency sensorineural hearing loss. The speech reception threshold is 40dB AD and 40dB AS. The discrimination score is 92% AD and 84% AS. The tympanogram is normal bilaterally.    Observations Functional status No functional status recorded  Cognitive status No cognitive status recorded  Assessment Assessment note 1. The patient's ear canals, tympanic membranes and middle ear spaces are all normal.   2. Bilateral high-frequency sensorineural hearing loss, consistent with presbycusis.   3. The patient's bilateral tinnitus is likely secondary to her sensorineural hearing loss.    Screenings/Interventions/Assessments No screenings/interventions/assessments recorded  Diagnoses attached to encounter No diagnoses attached  Plan Plan note 1. The physical exam and hearing test findings are reviewed with the patient.   2. The patient is a candidate for hearing amplification.   The hearing aid options are discussed with the patient.   3. Strategies to cope with tinnitus, which include the use of masker, hearing aids, avoidance of caffeine and alcohol, and tinnitus retraining therapy are again discussed with the patient.   4. The patient will return for reevaluation in approximately 12 months.

## 2024-01-20 DIAGNOSIS — H903 Sensorineural hearing loss, bilateral: Secondary | ICD-10-CM | POA: Insufficient documentation

## 2024-01-20 DIAGNOSIS — H9313 Tinnitus, bilateral: Secondary | ICD-10-CM | POA: Insufficient documentation

## 2024-01-24 ENCOUNTER — Ambulatory Visit: Payer: PPO

## 2024-01-24 DIAGNOSIS — I429 Cardiomyopathy, unspecified: Secondary | ICD-10-CM | POA: Diagnosis not present

## 2024-01-24 LAB — CUP PACEART REMOTE DEVICE CHECK
Battery Remaining Longevity: 146 mo
Battery Voltage: 3.04 V
Brady Statistic AP VP Percent: 13.61 %
Brady Statistic AP VS Percent: 1.34 %
Brady Statistic AS VP Percent: 31.79 %
Brady Statistic AS VS Percent: 53.27 %
Brady Statistic RA Percent Paced: 14.91 %
Brady Statistic RV Percent Paced: 45.39 %
Date Time Interrogation Session: 20250703064115
Implantable Lead Connection Status: 753985
Implantable Lead Connection Status: 753985
Implantable Lead Implant Date: 20240103
Implantable Lead Implant Date: 20240103
Implantable Lead Location: 753857
Implantable Lead Location: 753859
Implantable Lead Model: 3830
Implantable Lead Model: 5076
Implantable Pulse Generator Implant Date: 20240103
Lead Channel Impedance Value: 323 Ohm
Lead Channel Impedance Value: 380 Ohm
Lead Channel Impedance Value: 380 Ohm
Lead Channel Impedance Value: 494 Ohm
Lead Channel Pacing Threshold Amplitude: 0.5 V
Lead Channel Pacing Threshold Amplitude: 1.375 V
Lead Channel Pacing Threshold Pulse Width: 0.4 ms
Lead Channel Pacing Threshold Pulse Width: 0.4 ms
Lead Channel Sensing Intrinsic Amplitude: 2.125 mV
Lead Channel Sensing Intrinsic Amplitude: 2.125 mV
Lead Channel Sensing Intrinsic Amplitude: 9.25 mV
Lead Channel Sensing Intrinsic Amplitude: 9.25 mV
Lead Channel Setting Pacing Amplitude: 2 V
Lead Channel Setting Pacing Amplitude: 2.5 V
Lead Channel Setting Pacing Pulse Width: 0.4 ms
Lead Channel Setting Sensing Sensitivity: 1.2 mV
Zone Setting Status: 755011

## 2024-01-31 ENCOUNTER — Ambulatory Visit: Payer: Self-pay | Admitting: Internal Medicine

## 2024-02-08 ENCOUNTER — Encounter: Payer: Self-pay | Admitting: Advanced Practice Midwife

## 2024-03-11 ENCOUNTER — Ambulatory Visit: Payer: PPO | Admitting: Neurology

## 2024-03-19 ENCOUNTER — Ambulatory Visit (INDEPENDENT_AMBULATORY_CARE_PROVIDER_SITE_OTHER)

## 2024-03-19 VITALS — Ht 62.0 in | Wt 170.0 lb

## 2024-03-19 DIAGNOSIS — Z0000 Encounter for general adult medical examination without abnormal findings: Secondary | ICD-10-CM | POA: Diagnosis not present

## 2024-03-19 DIAGNOSIS — Z5320 Procedure and treatment not carried out because of patient's decision for unspecified reasons: Secondary | ICD-10-CM | POA: Diagnosis not present

## 2024-03-19 NOTE — Patient Instructions (Signed)
 Marisa Livingston , Thank you for taking time out of your busy schedule to complete your Annual Wellness Visit with me. I enjoyed our conversation and look forward to speaking with you again next year. I, as well as your care team,  appreciate your ongoing commitment to your health goals. Please review the following plan we discussed and let me know if I can assist you in the future.  Your Game plan/ To Do List  Flu Vaccine: April 16, 2024 at 10:30 am with nurse  Follow up Visits: We will see or speak with you next year for your Next Medicare AWV with our clinical staff   Clinician Recommendations:  Aim for 30 minutes of exercise or brisk walking, 6-8 glasses of water, and 5 servings of fruits and vegetables each day.    Wishing you many blessings and good health during the next year until our next visit.  -Crockett Rallo   This is a list of the screenings recommended for you:  Health Maintenance  Topic Date Due   Zoster (Shingles) Vaccine (1 of 2) Never done   COVID-19 Vaccine (3 - Moderna risk series) 07/08/2020   Flu Shot  02/22/2024   Mammogram  07/31/2024   Medicare Annual Wellness Visit  03/19/2025   DTaP/Tdap/Td vaccine (2 - Td or Tdap) 05/16/2032   Pneumococcal Vaccine for age over 26  Completed   DEXA scan (bone density measurement)  Completed   HPV Vaccine  Aged Out   Meningitis B Vaccine  Aged Out    Advanced directives: (Declined) Advance directive discussed with you today. Even though you declined this today, please call our office should you change your mind, and we can give you the proper paperwork for you to fill out. Advance Care Planning is important because it:  [x]  Makes sure you receive the medical care that is consistent with your values, goals, and preferences  [x]  It provides guidance to your family and loved ones and reduces their decisional burden about whether or not they are making the right decisions based on your wishes.  Follow the link provided in your after  visit summary or read over the paperwork we have mailed to you to help you started getting your Advance Directives in place. If you need assistance in completing these, please reach out to us  so that we can help you!  See attachments for Preventive Care and Fall Prevention Tips.

## 2024-03-19 NOTE — Progress Notes (Signed)
 Subjective:   Marisa Livingston is a 88 y.o. who presents for a Medicare Wellness preventive visit.  As a reminder, Annual Wellness Visits don't include a physical exam, and some assessments may be limited, especially if this visit is performed virtually. We may recommend an in-person follow-up visit with your provider if needed.  Visit Complete: Virtual I connected with  Marisa Livingston on 03/19/24 by a audio enabled telemedicine application and verified that I am speaking with the correct person using two identifiers.  Patient Location: Home  Provider Location: Office/Clinic  I discussed the limitations of evaluation and management by telemedicine. The patient expressed understanding and agreed to proceed.  Vital Signs: Because this visit was a virtual/telehealth visit, some criteria may be missing or patient reported. Any vitals not documented were not able to be obtained and vitals that have been documented are patient reported.  VideoDeclined- This patient declined Librarian, academic. Therefore the visit was completed with audio only.  Persons Participating in Visit: Patient.  AWV Questionnaire: No: Patient Medicare AWV questionnaire was not completed prior to this visit.  Cardiac Risk Factors include: advanced age (>33men, >21 women);dyslipidemia;obesity (BMI >30kg/m2);Other (see comment), Risk factor comments: history of stroke, left bundle branch block     Objective:    Today's Vitals   03/19/24 1243  Weight: 170 lb (77.1 kg)  Height: 5' 2 (1.575 m)  PainSc: 0-No pain   Body mass index is 31.09 kg/m.     03/19/2024   12:41 PM 04/25/2023    8:51 AM 08/28/2022   11:12 PM 08/28/2022    5:27 PM 09/02/2015    7:38 AM  Advanced Directives  Does Patient Have a Medical Advance Directive? No No Yes No No   Type of Advance Directive   Healthcare Power of Attorney    Does patient want to make changes to medical advance directive?   No - Patient  declined    Copy of Healthcare Power of Attorney in Chart?   No - copy requested    Would patient like information on creating a medical advance directive? No - Patient declined No - Patient declined  No - Patient declined No - patient declined information      Data saved with a previous flowsheet row definition    Current Medications (verified) Outpatient Encounter Medications as of 03/19/2024  Medication Sig   acetaminophen  (TYLENOL ) 325 MG tablet Take 325-650 mg by mouth every 6 (six) hours as needed for mild pain, fever or headache.   aspirin  EC 81 MG tablet Take 1 tablet (81 mg total) by mouth daily. Swallow whole.   Calcium  Citrate (CITRACAL PO) Take 1 tablet by mouth daily.   Multiple Vitamins-Minerals (MULTIVITAMIN WITH MINERALS) tablet Take 1 tablet by mouth daily.   rosuvastatin  (CRESTOR ) 20 MG tablet Take 1 tablet (20 mg total) by mouth daily.   sacubitril -valsartan  (ENTRESTO ) 24-26 MG Take 1 tablet by mouth 2 (two) times daily.   VITAMIN D  PO Take 1 capsule by mouth daily at 6 (six) AM.   No facility-administered encounter medications on file as of 03/19/2024.    Allergies (verified) Codeine and Levaquin [levofloxacin in d5w]   History: Past Medical History:  Diagnosis Date   Allergic rhinitis    Hyperlipidemia    Insomnia    LBBB (left bundle branch block)    Osteopenia    Stroke (HCC) 08/28/2022   Past Surgical History:  Procedure Laterality Date   COLONOSCOPY N/A 09/02/2015  Procedure: COLONOSCOPY;  Surgeon: Claudis RAYMOND Rivet, MD;  Location: [facility];  Service: Endoscopy;  Laterality: N/A;  1025 - moved to 2/9 @ 8:30 - Ann notified pt   PACEMAKER IMPLANT N/A 07/26/2022   Procedure: PACEMAKER IMPLANT;  Surgeon: Waddell Danelle ORN, MD;  Location: [facility] INVASIVE CV LAB;  Service: Cardiovascular;  Laterality: N/A;   POLYPECTOMY N/A 09/02/2015   Procedure: POLYPECTOMY;  Surgeon: Claudis RAYMOND Rivet, MD;  Location: [facility];  Service: Endoscopy;  Laterality: N/A;  Hepatic  Flexure Polyp   Family History  Problem Relation Age of Onset   Heart Problems Mother        had a pacemaker   Cancer Brother    Social History   Socioeconomic History   Marital status: Married    Spouse name: Not on file   Number of children: Not on file   Years of education: Not on file   Highest education level: Not on file  Occupational History   Not on file  Tobacco Use   Smoking status: Never   Smokeless tobacco: Never  Vaping Use   Vaping status: Never Used  Substance and Sexual Activity   Alcohol use: No   Drug use: No   Sexual activity: Not Currently    Birth control/protection: Post-menopausal  Other Topics Concern   Not on file  Social History Narrative   Lives in alone in [place]   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[employer]   Social Drivers of Corporate investment banker Strain: Low Risk  (03/19/2024)   Overall Financial Resource Strain (CARDIA)    Difficulty of Paying Living Expenses: Not hard at all  Food Insecurity: No Food Insecurity (03/19/2024)   Hunger Vital Sign    Worried About Running Out of Food in the Last Year: Never true    Ran Out of Food in the Last Year: Never true  Transportation Needs: No Transportation Needs (03/19/2024)   PRAPARE - Administrator, Civil Service (Medical): No    Lack of Transportation (Non-Medical): No  Physical Activity: Sufficiently Active (03/19/2024)   Exercise Vital Sign    Days of Exercise per Week: 7 days    Minutes of Exercise per Session: 30 min  Stress: No Stress Concern Present (03/19/2024)   [employer] of Occupational Health - Occupational Stress Questionnaire    Feeling of Stress: Not at all  Social Connections: Moderately Integrated (03/19/2024)   Social Connection and Isolation Panel    Frequency of Communication with Friends and Family: More than three times a week    Frequency of Social Gatherings with Friends and Family: More than three times a week    Attends  Religious Services: More than 4 times per year    Active Member of [employer] or Organizations: Yes    Attends Banker Meetings: More than 4 times per year    Marital Status: Widowed    Tobacco Counseling Counseling given: Yes    Clinical Intake:  Pre-visit preparation completed: Yes  Pain : No/denies pain Pain Score: 0-No pain     BMI - recorded: 31.09 Nutritional Status: BMI > 30  Obese Nutritional Risks: None Diabetes: No  Lab Results  Component Value Date   HGBA1C 6.0 (H) 07/20/2023   HGBA1C 5.8 (H) 08/28/2022     How often do you need to have someone help you when you read instructions, pamphlets, or other written materials from your doctor or pharmacy?: 1 - Never  Interpreter Needed?:  No  Information entered by :: Madison Albea W CMA (AAMA)   Activities of Daily Living     03/19/2024   12:52 PM  In your present state of health, do you have any difficulty performing the following activities:  Hearing? 1  Comment pt wears hearing aids  Vision? 0  Difficulty concentrating or making decisions? 0  Walking or climbing stairs? 0  Dressing or bathing? 0  Doing errands, shopping? 0  Preparing Food and eating ? N  Using the Toilet? N  In the past six months, have you accidently leaked urine? N  Do you have problems with loss of bowel control? N  Managing your Medications? N  Managing your Finances? N  Housekeeping or managing your Housekeeping? N    Patient Care Team: Bevely Doffing, FNP as PCP - General (Family Medicine) Jeffrie Oneil BROCKS, MD as Consulting Physician (Cardiology) Newton Mering, CNM as Midwife (Obstetrics and Gynecology) Waylan Cain, MD as Consulting Physician (Ophthalmology) Karis Clunes, MD as Consulting Physician (Otolaryngology) Waddell Danelle ORN, MD as Consulting Physician (Cardiology) Gregg Lek, MD as Consulting Physician (Neurology) Margrette Taft BRAVO, MD as Consulting Physician (Orthopedic Surgery)  I have updated your  Care Teams any recent Medical Services you may have received from other providers in the past year.     Assessment:   This is a routine wellness examination for Marisa Livingston.  Hearing/Vision screen Hearing Screening - Comments:: Patient wears hearing aids. Patient is up to date with yearly exams. Patient goes to [facility] in [place] Vision Screening - Comments:: Wears rx glasses - up to date with routine eye exams with  Dr. Cain Waylan   Goals Addressed               This Visit's Progress     Patient Stated (pt-stated)        I would like to travel to the beach.        Depression Screen     03/19/2024   12:56 PM 01/02/2024    3:29 PM 07/20/2023   11:00 AM 12/23/2020    9:23 AM 06/09/2020    9:36 AM 09/17/2017    9:02 AM 08/17/2016    9:04 AM  PHQ 2/9 Scores  PHQ - 2 Score 0 0 0 0 0 0 0  PHQ- 9 Score 0 0 0        Fall Risk     03/19/2024   12:45 PM 01/02/2024    3:29 PM 09/28/2023    2:07 PM 07/20/2023   11:00 AM 12/23/2020    9:23 AM  Fall Risk   Falls in the past year? 0 0 0 0 0  Number falls in past yr: 0 0 0 0 0  Injury with Fall? 0 0 0 0 0  Risk for fall due to : No Fall Risks No Fall Risks Other (Comment) No Fall Risks No Fall Risks  Risk for fall due to: Comment   age    Follow up Falls evaluation completed;Education provided;Falls prevention discussed Falls evaluation completed;Education provided;Falls prevention discussed Falls evaluation completed Falls evaluation completed Falls evaluation completed      Data saved with a previous flowsheet row definition    MEDICARE RISK AT HOME:  Medicare Risk at Home Any stairs in or around the home?: Yes If so, are there any without handrails?: No Home free of loose throw rugs in walkways, pet beds, electrical cords, etc?: Yes Adequate lighting in your home to reduce risk of falls?: Yes Life  alert?: No Use of a cane, walker or w/c?: No Grab bars in the bathroom?: No Shower chair or bench in shower?:  Yes Elevated toilet seat or a handicapped toilet?: Yes  TIMED UP AND GO:  Was the test performed?  No  Cognitive Function: 6CIT completed        03/19/2024   12:55 PM  6CIT Screen  What Year? 0 points  What month? 0 points  What time? 0 points  Count back from 20 0 points  Months in reverse 0 points  Repeat phrase 0 points  Total Score 0 points    Immunizations Immunization History  Administered Date(s) Administered   Fluad Quad(high Dose 65+) 04/15/2020   Influenza Split 05/14/2013   Influenza, Seasonal, Injecte, Preservative Fre 04/19/2023   Influenza,inj,Quad PF,6+ Mos 04/28/2014, 04/28/2015, 04/13/2016, 04/10/2017, 04/12/2018, 04/24/2019   Influenza-Unspecified 04/23/2012   Moderna SARS-COV2 Booster Vaccination 06/10/2020   Moderna Sars-Covid-2 Vaccination 08/07/2019, 09/06/2019   Pneumococcal Conjugate-13 12/24/2014   Pneumococcal Polysaccharide-23 05/26/2013   Tdap 05/16/2022    Screening Tests Health Maintenance  Topic Date Due   Zoster Vaccines- Shingrix (1 of 2) Never done   COVID-19 Vaccine (3 - Moderna risk series) 07/08/2020   INFLUENZA VACCINE  02/22/2024   MAMMOGRAM  07/31/2024   Medicare Annual Wellness (AWV)  03/19/2025   DTaP/Tdap/Td (2 - Td or Tdap) 05/16/2032   Pneumococcal Vaccine: 50+ Years  Completed   DEXA SCAN  Completed   HPV VACCINES  Aged Out   Meningococcal B Vaccine  Aged Out    Health Maintenance  Health Maintenance Due  Topic Date Due   Zoster Vaccines- Shingrix (1 of 2) Never done   COVID-19 Vaccine (3 - Moderna risk series) 07/08/2020   INFLUENZA VACCINE  02/22/2024   Health Maintenance Items Addressed: Appointment Scheduled for flu vaccine  Additional Screening:  Vision Screening: Recommended annual ophthalmology exams for early detection of glaucoma and other disorders of the eye. Would you like a referral to an eye doctor? No    Dental Screening: Recommended annual dental exams for proper oral hygiene  Community  Resource Referral / Chronic Care Management: CRR required this visit?  No   CCM required this visit?  No   Plan:    I have personally reviewed and noted the following in the patient's chart:   Medical and social history Use of alcohol, tobacco or illicit drugs  Current medications and supplements including opioid prescriptions. Patient is not currently taking opioid prescriptions. Functional ability and status Nutritional status Physical activity Advanced directives List of other physicians Hospitalizations, surgeries, and ER visits in previous 12 months Vitals Screenings to include cognitive, depression, and falls Referrals and appointments  In addition, I have reviewed and discussed with patient certain preventive protocols, quality metrics, and best practice recommendations. A written personalized care plan for preventive services as well as general preventive health recommendations were provided to patient.   Lolitha Tortora, CMA   03/19/2024   After Visit Summary: (MyChart) Due to this being a telephonic visit, the after visit summary with patients personalized plan was offered to patient via MyChart   Notes: Nothing significant to report at this time.

## 2024-04-14 DIAGNOSIS — L821 Other seborrheic keratosis: Secondary | ICD-10-CM | POA: Diagnosis not present

## 2024-04-14 DIAGNOSIS — D1801 Hemangioma of skin and subcutaneous tissue: Secondary | ICD-10-CM | POA: Diagnosis not present

## 2024-04-14 DIAGNOSIS — L570 Actinic keratosis: Secondary | ICD-10-CM | POA: Diagnosis not present

## 2024-04-14 DIAGNOSIS — C44319 Basal cell carcinoma of skin of other parts of face: Secondary | ICD-10-CM | POA: Diagnosis not present

## 2024-04-14 DIAGNOSIS — Z85828 Personal history of other malignant neoplasm of skin: Secondary | ICD-10-CM | POA: Diagnosis not present

## 2024-04-14 DIAGNOSIS — L814 Other melanin hyperpigmentation: Secondary | ICD-10-CM | POA: Diagnosis not present

## 2024-04-16 ENCOUNTER — Ambulatory Visit (INDEPENDENT_AMBULATORY_CARE_PROVIDER_SITE_OTHER)

## 2024-04-16 DIAGNOSIS — Z23 Encounter for immunization: Secondary | ICD-10-CM

## 2024-04-16 NOTE — Progress Notes (Signed)
 Patient is in office today for a nurse visit for Immunization. Patient Injection was given in the  Left deltoid. Patient tolerated injection well.

## 2024-04-24 ENCOUNTER — Ambulatory Visit: Payer: PPO

## 2024-04-24 DIAGNOSIS — I447 Left bundle-branch block, unspecified: Secondary | ICD-10-CM

## 2024-04-25 LAB — CUP PACEART REMOTE DEVICE CHECK
Battery Remaining Longevity: 141 mo
Battery Voltage: 3.03 V
Brady Statistic AP VP Percent: 13.73 %
Brady Statistic AP VS Percent: 1 %
Brady Statistic AS VP Percent: 35.59 %
Brady Statistic AS VS Percent: 49.69 %
Brady Statistic RA Percent Paced: 14.68 %
Brady Statistic RV Percent Paced: 49.31 %
Date Time Interrogation Session: 20251002001543
Implantable Lead Connection Status: 753985
Implantable Lead Connection Status: 753985
Implantable Lead Implant Date: 20240103
Implantable Lead Implant Date: 20240103
Implantable Lead Location: 753857
Implantable Lead Location: 753859
Implantable Lead Model: 3830
Implantable Lead Model: 5076
Implantable Pulse Generator Implant Date: 20240103
Lead Channel Impedance Value: 323 Ohm
Lead Channel Impedance Value: 361 Ohm
Lead Channel Impedance Value: 361 Ohm
Lead Channel Impedance Value: 456 Ohm
Lead Channel Pacing Threshold Amplitude: 0.5 V
Lead Channel Pacing Threshold Amplitude: 1.25 V
Lead Channel Pacing Threshold Pulse Width: 0.4 ms
Lead Channel Pacing Threshold Pulse Width: 0.4 ms
Lead Channel Sensing Intrinsic Amplitude: 2.375 mV
Lead Channel Sensing Intrinsic Amplitude: 2.375 mV
Lead Channel Sensing Intrinsic Amplitude: 6.625 mV
Lead Channel Sensing Intrinsic Amplitude: 6.625 mV
Lead Channel Setting Pacing Amplitude: 2 V
Lead Channel Setting Pacing Amplitude: 2.5 V
Lead Channel Setting Pacing Pulse Width: 0.4 ms
Lead Channel Setting Sensing Sensitivity: 1.2 mV
Zone Setting Status: 755011

## 2024-04-25 NOTE — Progress Notes (Signed)
 Remote PPM Transmission

## 2024-04-27 ENCOUNTER — Ambulatory Visit: Payer: Self-pay | Admitting: Internal Medicine

## 2024-05-02 NOTE — Progress Notes (Signed)
 Remote PPM Transmission

## 2024-05-14 ENCOUNTER — Ambulatory Visit: Admitting: Neurology

## 2024-05-14 ENCOUNTER — Encounter: Payer: Self-pay | Admitting: Neurology

## 2024-05-14 VITALS — BP 143/85 | HR 102 | Ht 62.0 in | Wt 174.5 lb

## 2024-05-14 DIAGNOSIS — I63511 Cerebral infarction due to unspecified occlusion or stenosis of right middle cerebral artery: Secondary | ICD-10-CM

## 2024-05-14 NOTE — Progress Notes (Signed)
 [facility]  PATIENT: Marisa Livingston DOB: Dec 13, 1935  REQUESTING CLINICIAN: Shona Norleen PEDLAR, MD HISTORY FROM: Patient and son  REASON FOR VISIT: TIA    HISTORICAL  CHIEF COMPLAINT:  Chief Complaint  Patient presents with   RM 12     Patient is here alone  for TIA follow-up - no issues     INTERVAL HISTORY 05/14/2024 Patient presents today for follow-up, last visit was in February 2024.  At that time, she was diagnosed with TIA, recommended to continue with DAPT for a total of 3 months due to intracranial atherosclerosis.  In April of that same year she did have an MRI showing a subacute stroke.  Fortunately for patient she did not have any deficit from the stroke.  Since then she has been doing well, compliant with her medications, independent in all her ADLs.  Currently does not have any additional question or concerns prior   HISTORY OF PRESENT ILLNESS:  This is a 88 year old woman with past medical history of heart disease, hypertension hyperlipidemia who is presenting after being diagnosed in the hospital for TIA.  Patient reports waking up with right-sided headache and abdominal speech described as slurred speech. Tylenol  did not help with the pain. She was on the phone with her son and the son noted that her speech was very slurred.  Son contacted EMS.  The entire episode lasted about 30 minutes.  By the time son reached patient, her symptom were resolved.  She was taken to the hospital, admitted for TIA workup.  CT was normal, but her CTA head and neck show a focal stenosis within the inferior division of the right MCA branch.  Her MRI was not completed.  Again her symptoms only lasted for 30 minutes.  She was discharged with DAPT, aspirin  and Plavix  for total of 40-months and patient will continue with aspirin  alone.  She is pending an MRI scheduled for April 9.  Since discharge from the hospital she is back to her normal self, she has not had any additional  events.    Discharge Summary  Marisa Livingston is a 88 y.o. female with medical history significant for complete heart block, pacemaker status, systolic CHF, LBBB. Patient presented to the ED with complaints of abnormal speech, headache, and abnormal sensation to her left hand.  On arrival to the ED her symptoms had resolved. Symptoms started about noon.  She reports headache to the right side of her head, feeling of congestion in her nose, and an abnormal feeling to her right hand.  She denies weakness or numbness to her hand. She was able to ambulate without difficulty.  She called her son and he thought she had different. Patient reports her speech was slowed, but not slurred.  No change in vision.  Denies prior strokes.  Not on aspirin . Recent pacemaker placement 07/26/2022 by Dr. Waddell. ED Course: Blood pressure 143/64.  Stable vitals.  Head CT negative for acute abnormality. Neurology was consulted, recommended admission for TIA workup-to include CTA head and neck, MRI    OTHER MEDICAL CONDITIONS: Heart disease, Hypertension   REVIEW OF SYSTEMS: Full 14 system review of systems performed and negative with exception of: As noted in the HPI   ALLERGIES: Allergies  Allergen Reactions   Codeine Nausea Only   Levaquin [Levofloxacin In D5w]     Sick     HOME MEDICATIONS: Outpatient Medications Prior to Visit  Medication Sig Dispense Refill   acetaminophen  (TYLENOL ) 325 MG  tablet Take 325-650 mg by mouth every 6 (six) hours as needed for mild pain, fever or headache.     aspirin  EC 81 MG tablet Take 1 tablet (81 mg total) by mouth daily. Swallow whole. 30 tablet 12   Calcium  Citrate (CITRACAL PO) Take 1 tablet by mouth daily.     Multiple Vitamins-Minerals (MULTIVITAMIN WITH MINERALS) tablet Take 1 tablet by mouth daily.     rosuvastatin  (CRESTOR ) 20 MG tablet Take 1 tablet (20 mg total) by mouth daily. 90 tablet 3   sacubitril -valsartan  (ENTRESTO ) 24-26 MG Take 1 tablet by mouth 2  (two) times daily. 180 tablet 3   VITAMIN D  PO Take 1 capsule by mouth daily at 6 (six) AM.     No facility-administered medications prior to visit.    PAST MEDICAL HISTORY: Past Medical History:  Diagnosis Date   Allergic rhinitis    Hyperlipidemia    Insomnia    LBBB (left bundle branch block)    Osteopenia    Stroke (HCC) 08/28/2022    PAST SURGICAL HISTORY: Past Surgical History:  Procedure Laterality Date   COLONOSCOPY N/A 09/02/2015   Procedure: COLONOSCOPY;  Surgeon: Claudis RAYMOND Rivet, MD;  [facility]: [facility];  Service: Endoscopy;  Laterality: N/A;  1025 - moved to 2/9 @ 8:30 - Ann notified pt   PACEMAKER IMPLANT N/A 07/26/2022   Procedure: PACEMAKER IMPLANT;  Surgeon: Waddell Danelle ORN, MD;  Location: [facility]VAS[facility] CV [facility];  Service: Cardiovascular;  Laterality: N/A;   POLYPECTOMY N/A 09/02/2015   Procedure: POLYPECTOMY;  Surgeon: Claudis RAYMOND Rivet, MD;  Location: [facility];  Service: Endoscopy;  Laterality: N/A;  Hepatic Flexure Polyp    FAMILY HISTORY: Family History  Problem Relation Age of Onset   Heart Problems Mother        had a pacemaker   Cancer Brother     SOCIAL HISTORY: Social History   Socioeconomic History   Marital status: Married    Spouse name: Not on file   Number of children: Not on file   Years of education: Not on file   Highest education level: Not on file  Occupational History   Not on file  Tobacco Use   Smoking status: Never   Smokeless tobacco: Never  Vaping Use   Vaping status: Never Used  Substance and Sexual Activity   Alcohol use: No   Drug use: No   Sexual activity: Not Currently    Birth control/protection: Post-menopausal  Other Topics Concern   Not on file  Social History Narrative   Lives in alone in [place]   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[employer]      No caffeine consumption    Social Drivers of Corporate investment banker Strain: Low Risk  (03/19/2024)   Overall Financial Resource Strain  (CARDIA)    Difficulty of Paying Living Expenses: Not hard at all  Food Insecurity: No Food Insecurity (03/19/2024)   Hunger Vital Sign    Worried About Running Out of Food in the Last Year: Never true    Ran Out of Food in the Last Year: Never true  Transportation Needs: No Transportation Needs (03/19/2024)   PRAPARE - Administrator, Civil Service (Medical): No    Lack of Transportation (Non-Medical): No  Physical Activity: Sufficiently Active (03/19/2024)   Exercise Vital Sign    Days of Exercise per Week: 7 days    Minutes of Exercise per Session: 30 min  Stress: No Stress Concern  Present (03/19/2024)   [employer] of Occupational Health - Occupational Stress Questionnaire    Feeling of Stress: Not at all  Social Connections: Moderately Integrated (03/19/2024)   Social Connection and Isolation Panel    Frequency of Communication with Friends and Family: More than three times a week    Frequency of Social Gatherings with Friends and Family: More than three times a week    Attends Religious Services: More than 4 times per year    Active Member of [employer] or Organizations: Yes    Attends Banker Meetings: More than 4 times per year    Marital Status: Widowed  Intimate Partner Violence: Not At Risk (03/19/2024)   Humiliation, Afraid, Rape, and Kick questionnaire    Fear of Current or Ex-Partner: No    Emotionally Abused: No    Physically Abused: No    Sexually Abused: No   PHYSICAL EXAM  GENERAL EXAM/CONSTITUTIONAL: Vitals:  Vitals:   05/14/24 1138  BP: (!) 143/85  Pulse: (!) 102  SpO2: 98%  Weight: 174 lb 8 oz (79.2 kg)  Height: 5' 2 (1.575 m)   Body mass index is 31.92 kg/m. Wt Readings from Last 3 Encounters:  05/14/24 174 lb 8 oz (79.2 kg)  03/19/24 170 lb (77.1 kg)  01/02/24 171 lb (77.6 kg)   Patient is in no distress; well developed, nourished and groomed; neck is supple  MUSCULOSKELETAL: Gait, strength, tone, movements noted in  Neurologic exam below  NEUROLOGIC: MENTAL STATUS:      No data to display         awake, alert, oriented to person, place and time recent and remote memory intact normal attention and concentration language fluent, comprehension intact, naming intact fund of knowledge appropriate  CRANIAL NERVE:  2nd, 3rd, 4th, 6th - Visual fields full to confrontation, extraocular muscles intact, no nystagmus 5th - facial sensation symmetric 7th - facial strength symmetric 8th - hearing intact 9th - palate elevates symmetrically, uvula midline 11th - shoulder shrug symmetric 12th - tongue protrusion midline  MOTOR:  normal bulk and tone, full strength in the BUE, BLE  SENSORY:  normal and symmetric to light touch  COORDINATION:  finger-nose-finger, fine finger movements normal  GAIT/STATION:  normal     DIAGNOSTIC DATA (LABS, IMAGING, TESTING) - I reviewed patient records, labs, notes, testing and imaging myself where available.  Lab Results  Component Value Date   WBC 9.4 07/20/2023   HGB 12.2 07/20/2023   HCT 37.8 07/20/2023   MCV 90 07/20/2023   PLT 231 07/20/2023      Component Value Date/Time   NA 142 07/20/2023 1125   K 4.4 07/20/2023 1125   CL 104 07/20/2023 1125   CO2 24 07/20/2023 1125   GLUCOSE 111 (H) 07/20/2023 1125   GLUCOSE 107 (H) 08/28/2022 1812   BUN 18 07/20/2023 1125   CREATININE 0.96 07/20/2023 1125   CREATININE 0.84 07/25/2014 0810   CALCIUM  9.3 07/20/2023 1125   PROT 7.0 07/20/2023 1125   ALBUMIN 4.1 07/20/2023 1125   AST 28 07/20/2023 1125   ALT 19 07/20/2023 1125   ALKPHOS 61 07/20/2023 1125   BILITOT 0.3 07/20/2023 1125   GFRNONAA >60 08/28/2022 1812   GFRAA 57 (L) 09/16/2020 1011   Lab Results  Component Value Date   CHOL 131 07/20/2023   HDL 65 07/20/2023   LDLCALC 49 07/20/2023   TRIG 89 07/20/2023   CHOLHDL 2.0 07/20/2023   Lab Results  Component Value Date  HGBA1C 6.0 (H) 07/20/2023   Lab Results  Component Value Date    VITAMINB12 614 07/20/2023   Lab Results  Component Value Date   TSH 3.930 07/20/2023    CT Head 08/28/2022 1. No acute intracranial abnormalities.   CTA Head and Neck 08/28/2022 1. No large vessel occlusion. 2. No carotid bifurcation disease. 3. Atherosclerotic change in both carotid siphon regions but without stenosis greater than 30%. 4. Focal stenosis within an inferior division right MCA branch that could place the patient at risk of an infarction in that territory. The clinical history is that of headache. If the patient had a clinical presentation of right brain stroke, I could not exclude the possibility that this stenosis could be due to a nonocclusive embolus. In the absence of a stroke presentation, this is presumed to represent a chronic stenosis. 5. Aortic atherosclerosis.  US  Carotid 08/29/2022 1. Minor carotid atherosclerosis. Negative for stenosis. Degree of narrowing less than 50% bilaterally by ultrasound criteria. 2. Patent antegrade vertebral flow bilaterally.  MRI Brain 10/31/2022 1. Moderate-sized subacute right MCA infarct. 2. Mild chronic small vessel ischemic disease.    ASSESSMENT AND PLAN  88 y.o. year old female with history of heart disease, hypertension hyperlipidemia who is presenting following a stroke in April 2024.  She completed DAPT for 3 months, currently on aspirin , Crestor  and also Entresto .  She does not have any deficits from her stroke.  Plan will be for patient to continue current medications, continue follow-up PCP and return as needed.  1. Cerebrovascular accident (CVA) due to occlusion of right middle cerebral artery [facility])      Patient Instructions  Continue current medication including aspirin  81 mg daily Crestor  40 mg daily and Entresto  Continue follow-up PCP Return as needed  No orders of the defined types were placed in this encounter.   No orders of the defined types were placed in this encounter.   Return if symptoms worsen  or fail to improve.    Pastor Falling, MD 05/14/2024, 12:16 PM  [facility] [address] [place], [place] [address] [phone]

## 2024-05-14 NOTE — Patient Instructions (Signed)
 Continue current medication including aspirin  81 mg daily Crestor  40 mg daily and Entresto  Continue follow-up PCP Return as needed

## 2024-07-04 ENCOUNTER — Ambulatory Visit (INDEPENDENT_AMBULATORY_CARE_PROVIDER_SITE_OTHER)

## 2024-07-04 VITALS — BP 126/70 | HR 86 | Ht 62.0 in | Wt 166.0 lb

## 2024-07-04 DIAGNOSIS — I5022 Chronic systolic (congestive) heart failure: Secondary | ICD-10-CM

## 2024-07-04 DIAGNOSIS — Z0001 Encounter for general adult medical examination with abnormal findings: Secondary | ICD-10-CM

## 2024-07-04 DIAGNOSIS — E785 Hyperlipidemia, unspecified: Secondary | ICD-10-CM

## 2024-07-04 DIAGNOSIS — R7303 Prediabetes: Secondary | ICD-10-CM

## 2024-07-04 DIAGNOSIS — M8580 Other specified disorders of bone density and structure, unspecified site: Secondary | ICD-10-CM | POA: Diagnosis not present

## 2024-07-04 NOTE — Progress Notes (Signed)
 Complete physical exam  Patient: Marisa Livingston   DOB: [birth year]   88 y.o. Female  MRN: [id number]  Subjective:    Chief Complaint  Patient presents with   Annual Exam    Marisa Livingston is a 88 y.o. female who presents today for a complete physical exam. She reports consuming a general diet. Home exercise routine includes walking 3 hrs per week. She generally feels well. She reports sleeping well. She does not have additional problems to discuss today.    Most recent fall risk assessment:    03/19/2024   12:45 PM  Fall Risk   Falls in the past year? 0  Number falls in past yr: 0  Injury with Fall? 0   Risk for fall due to : No Fall Risks  Follow up Falls evaluation completed;Education provided;Falls prevention discussed     Data saved with a previous flowsheet row definition     Most recent depression screenings:    03/19/2024   12:56 PM 01/02/2024    3:29 PM  PHQ 2/9 Scores  PHQ - 2 Score 0 0  PHQ- 9 Score 0  0      Data saved with a previous flowsheet row definition      Patient Active Problem List   Diagnosis Date Noted   Tinnitus of both ears 01/20/2024   Sensorineural hearing loss, bilateral 01/20/2024   Left ankle swelling 09/28/2023   History of CVA (cerebrovascular accident) 07/20/2023   Prediabetes 07/20/2023   TIA (transient ischemic attack) 08/28/2022   Pacemaker 08/28/2022   Elevated troponin 08/28/2022   Complete heart block (HCC) 07/21/2022   Osteopenia 01/12/2021   Chronic systolic congestive heart failure (HCC) 12/15/2020   Tachycardia 06/19/2020   Left bundle branch block (LBBB) on electrocardiogram 06/09/2020   Serum potassium elevated 06/09/2020   Chronic kidney disease, stage 3 (HCC) 04/17/2018   Hyperlipidemia LDL goal <130 11/20/2012      Patient Care Team: Bevely Doffing, FNP as PCP - General (Family Medicine) Jeffrie Oneil BROCKS, MD as Consulting Physician (Cardiology) Newton Mering, CNM as Midwife (Obstetrics and  Gynecology) Waylan Cain, MD as Consulting Physician (Ophthalmology) Karis Clunes, MD as Consulting Physician (Otolaryngology) Waddell Danelle ORN, MD as Consulting Physician (Cardiology) Gregg Lek, MD as Consulting Physician (Neurology) Margrette Taft BRAVO, MD as Consulting Physician (Orthopedic Surgery)   Show/hide medication list[1]  ROS     Objective:     BP 126/70 (BP Location: Left Arm, Patient Position: Sitting, Cuff Size: Normal)   Pulse 86   Ht 5' 2 (1.575 m)   Wt 166 lb (75.3 kg)   SpO2 97%   BMI 30.36 kg/m  BP Readings from Last 3 Encounters:  07/04/24 126/70  05/14/24 (!) 143/85  01/18/24 (!) 146/77   Wt Readings from Last 3 Encounters:  07/04/24 166 lb (75.3 kg)  05/14/24 174 lb 8 oz (79.2 kg)  03/19/24 170 lb (77.1 kg)      Physical Exam Vitals and nursing note reviewed.  Constitutional:      Appearance: Normal appearance.  HENT:     Head: Normocephalic.     Right Ear: Tympanic membrane, ear canal and external ear normal.     Left Ear: Tympanic membrane, ear canal and external ear normal.     Nose: Nose normal.     Mouth/Throat:     Mouth: Mucous membranes are moist.     Pharynx: Oropharynx is clear.  Eyes:     Extraocular Movements: Extraocular movements intact.  Conjunctiva/sclera: Conjunctivae normal.     Pupils: Pupils are equal, round, and reactive to light.  Cardiovascular:     Rate and Rhythm: Normal rate and regular rhythm.  Pulmonary:     Effort: Pulmonary effort is normal.     Breath sounds: Normal breath sounds.  Abdominal:     General: Bowel sounds are normal.     Palpations: Abdomen is soft.  Musculoskeletal:        General: Normal range of motion.     Cervical back: Normal range of motion and neck supple.  Skin:    General: Skin is warm and dry.  Neurological:     Mental Status: She is alert and oriented to person, place, and time.  Psychiatric:        Mood and Affect: Mood normal.        Thought Content: Thought  content normal.           Assessment & Plan:    Routine Health Maintenance and Physical Exam  Immunization History  Administered Date(s) Administered   Fluad Quad(high Dose 65+) 04/15/2020   INFLUENZA, HIGH DOSE SEASONAL PF 04/16/2024   Influenza Split 05/14/2013   Influenza, Seasonal, Injecte, Preservative Fre 04/19/2023   Influenza,inj,Quad PF,6+ Mos 04/28/2014, 04/28/2015, 04/13/2016, 04/10/2017, 04/12/2018, 04/24/2019   Influenza-Unspecified 04/23/2012   Moderna SARS-COV2 Booster Vaccination 06/10/2020   Moderna Sars-Covid-2 Vaccination 08/07/2019, 09/06/2019   Pneumococcal Conjugate-13 12/24/2014   Pneumococcal Polysaccharide-23 05/26/2013   Tdap 05/16/2022    Health Maintenance  Topic Date Due   Zoster Vaccines- Shingrix (1 of 2) Never done   COVID-19 Vaccine (4 - 2025-26 season) 03/24/2024   Mammogram  07/31/2024   Medicare Annual Wellness (AWV)  03/19/2025   DTaP/Tdap/Td (2 - Td or Tdap) 05/16/2032   Pneumococcal Vaccine: 50+ Years  Completed   Influenza Vaccine  Completed   Bone Density Scan  Completed   Meningococcal B Vaccine  Aged Out    Discussed health benefits of physical activity, and encouraged her to engage in regular exercise appropriate for her age and condition.  Problem List Items Addressed This Visit       Cardiovascular and Mediastinum   Chronic systolic congestive heart failure (HCC)   Heart rate slightly elevated, blood pressure controlled at 126/70 mmHg. No acute issues. - Maintain regular cardiologist follow-up.      Relevant Orders   CBC with Differential/Platelet (Completed)   CMP14+EGFR (Completed)   TSH + free T4 (Completed)     Musculoskeletal and Integument   Osteopenia   She remains on vitamin D  and calcium  supplementation.  Vitamin D  level was insufficient on labs from December.  I have recommended increasing daily vitamin D  supplementation.      Relevant Orders   VITAMIN D  25 Hydroxy (Vit-D Deficiency, Fractures)  (Completed)     Other   Hyperlipidemia LDL goal <130   Currently managed with rosuvastatin  20 mg daily. Recheck fasting labs today.        Relevant Orders   Lipid panel (Completed)   Prediabetes   Relevant Orders   Hemoglobin A1c (Completed)   Other Visit Diagnoses       Encounter for preventative adult health care exam with abnormal findings    -  Primary   Routine exam showed no acute concerns. Blood pressure controlled upon recheck. - Ordered CBC. - Scheduled follow-up in six months.   Relevant Orders   CBC with Differential/Platelet (Completed)   CMP14+EGFR (Completed)   Lipid panel (Completed)   Hemoglobin  A1c (Completed)   TSH + free T4 (Completed)   VITAMIN D  25 Hydroxy (Vit-D Deficiency, Fractures) (Completed)      Return in about 6 months (around 01/02/2025) for chronic follow-up with PCP.    Leita Longs, FNP      [1]  Outpatient Medications Prior to Visit  Medication Sig   acetaminophen  (TYLENOL ) 325 MG tablet Take 325-650 mg by mouth every 6 (six) hours as needed for mild pain, fever or headache.   aspirin  EC 81 MG tablet Take 1 tablet (81 mg total) by mouth daily. Swallow whole.   Calcium  Citrate (CITRACAL PO) Take 1 tablet by mouth daily.   Multiple Vitamins-Minerals (MULTIVITAMIN WITH MINERALS) tablet Take 1 tablet by mouth daily.   rosuvastatin  (CRESTOR ) 20 MG tablet Take 1 tablet (20 mg total) by mouth daily.   sacubitril -valsartan  (ENTRESTO ) 24-26 MG Take 1 tablet by mouth 2 (two) times daily.   VITAMIN D  PO Take 1 capsule by mouth daily at 6 (six) AM.   No facility-administered medications prior to visit.

## 2024-07-05 LAB — CMP14+EGFR
ALT: 16 IU/L (ref 0–32)
AST: 25 IU/L (ref 0–40)
Albumin: 4.4 g/dL (ref 3.7–4.7)
Alkaline Phosphatase: 53 IU/L (ref 48–129)
BUN/Creatinine Ratio: 25 (ref 12–28)
BUN: 20 mg/dL (ref 8–27)
Bilirubin Total: 0.4 mg/dL (ref 0.0–1.2)
CO2: 24 mmol/L (ref 20–29)
Calcium: 9.9 mg/dL (ref 8.7–10.3)
Chloride: 103 mmol/L (ref 96–106)
Creatinine, Ser: 0.79 mg/dL (ref 0.57–1.00)
Globulin, Total: 2.7 g/dL (ref 1.5–4.5)
Glucose: 82 mg/dL (ref 70–99)
Potassium: 4.9 mmol/L (ref 3.5–5.2)
Sodium: 141 mmol/L (ref 134–144)
Total Protein: 7.1 g/dL (ref 6.0–8.5)
eGFR: 73 mL/min/1.73 (ref >59)

## 2024-07-05 LAB — LIPID PANEL
Chol/HDL Ratio: 1.9 ratio (ref 0.0–4.4)
Cholesterol, Total: 123 mg/dL (ref 100–199)
HDL: 66 mg/dL (ref >39)
LDL Chol Calc (NIH): 41 mg/dL (ref 0–99)
Triglycerides: 79 mg/dL (ref 0–149)
VLDL Cholesterol Cal: 16 mg/dL (ref 5–40)

## 2024-07-05 LAB — CBC WITH DIFFERENTIAL/PLATELET
Basophils Absolute: 0.1 x10E3/uL (ref 0.0–0.2)
Basos: 1 %
EOS (ABSOLUTE): 0.1 x10E3/uL (ref 0.0–0.4)
Eos: 1 %
Hematocrit: 42.2 % (ref 34.0–46.6)
Hemoglobin: 13.3 g/dL (ref 11.1–15.9)
Immature Grans (Abs): 0 x10E3/uL (ref 0.0–0.1)
Immature Granulocytes: 0 %
Lymphocytes Absolute: 1.9 x10E3/uL (ref 0.7–3.1)
Lymphs: 30 %
MCH: 28.6 pg (ref 26.6–33.0)
MCHC: 31.5 g/dL (ref 31.5–35.7)
MCV: 91 fL (ref 79–97)
Monocytes Absolute: 0.5 x10E3/uL (ref 0.1–0.9)
Monocytes: 8 %
Neutrophils Absolute: 3.9 x10E3/uL (ref 1.4–7.0)
Neutrophils: 60 %
Platelets: 242 x10E3/uL (ref 150–450)
RBC: 4.65 x10E6/uL (ref 3.77–5.28)
RDW: 12.9 % (ref 11.7–15.4)
WBC: 6.5 x10E3/uL (ref 3.4–10.8)

## 2024-07-05 LAB — HEMOGLOBIN A1C
Est. average glucose Bld gHb Est-mCnc: 126 mg/dL
Hgb A1c MFr Bld: 6 % — ABNORMAL HIGH (ref 4.8–5.6)

## 2024-07-05 LAB — VITAMIN D 25 HYDROXY (VIT D DEFICIENCY, FRACTURES): Vit D, 25-Hydroxy: 48.4 ng/mL (ref 30.0–100.0)

## 2024-07-05 LAB — TSH+FREE T4
Free T4: 1.13 ng/dL (ref 0.82–1.77)
TSH: 6.04 u[IU]/mL — ABNORMAL HIGH (ref 0.450–4.500)

## 2024-07-06 ENCOUNTER — Ambulatory Visit: Payer: Self-pay

## 2024-07-06 NOTE — Assessment & Plan Note (Signed)
 She remains on vitamin D  and calcium  supplementation.  Vitamin D  level was insufficient on labs from December.  I have recommended increasing daily vitamin D  supplementation.

## 2024-07-06 NOTE — Assessment & Plan Note (Signed)
 Heart rate slightly elevated, blood pressure controlled at 126/70 mmHg. No acute issues. - Maintain regular cardiologist follow-up.

## 2024-07-06 NOTE — Assessment & Plan Note (Signed)
 Currently managed with rosuvastatin  20 mg daily. Recheck fasting labs today.

## 2024-07-25 ENCOUNTER — Ambulatory Visit: Payer: PPO

## 2024-07-25 DIAGNOSIS — I447 Left bundle-branch block, unspecified: Secondary | ICD-10-CM

## 2024-07-27 LAB — CUP PACEART REMOTE DEVICE CHECK
Battery Remaining Longevity: 138 mo
Battery Voltage: 3.03 V
Brady Statistic AP VP Percent: 18.1 %
Brady Statistic AP VS Percent: 0.96 %
Brady Statistic AS VP Percent: 27.94 %
Brady Statistic AS VS Percent: 53 %
Brady Statistic RA Percent Paced: 19.03 %
Brady Statistic RV Percent Paced: 46.04 %
Date Time Interrogation Session: 20260101223834
Implantable Lead Connection Status: 753985
Implantable Lead Connection Status: 753985
Implantable Lead Implant Date: 20240103
Implantable Lead Implant Date: 20240103
Implantable Lead Location: 753857
Implantable Lead Location: 753859
Implantable Lead Model: 3830
Implantable Lead Model: 5076
Implantable Pulse Generator Implant Date: 20240103
Lead Channel Impedance Value: 304 Ohm
Lead Channel Impedance Value: 361 Ohm
Lead Channel Impedance Value: 361 Ohm
Lead Channel Impedance Value: 475 Ohm
Lead Channel Pacing Threshold Amplitude: 0.5 V
Lead Channel Pacing Threshold Amplitude: 1.125 V
Lead Channel Pacing Threshold Pulse Width: 0.4 ms
Lead Channel Pacing Threshold Pulse Width: 0.4 ms
Lead Channel Sensing Intrinsic Amplitude: 1.75 mV
Lead Channel Sensing Intrinsic Amplitude: 1.75 mV
Lead Channel Sensing Intrinsic Amplitude: 8.75 mV
Lead Channel Sensing Intrinsic Amplitude: 8.75 mV
Lead Channel Setting Pacing Amplitude: 2 V
Lead Channel Setting Pacing Amplitude: 2.5 V
Lead Channel Setting Pacing Pulse Width: 0.4 ms
Lead Channel Setting Sensing Sensitivity: 1.2 mV
Zone Setting Status: 755011

## 2024-07-28 ENCOUNTER — Ambulatory Visit: Payer: Self-pay | Admitting: Student in an Organized Health Care Education/Training Program

## 2024-07-30 NOTE — Progress Notes (Signed)
 Remote PPM Transmission

## 2024-08-25 ENCOUNTER — Other Ambulatory Visit (HOSPITAL_COMMUNITY): Payer: Self-pay

## 2024-08-25 ENCOUNTER — Encounter: Payer: Self-pay | Admitting: Cardiology

## 2024-08-25 ENCOUNTER — Telehealth: Payer: Self-pay | Admitting: Pharmacy Technician

## 2024-08-25 NOTE — Telephone Encounter (Signed)
 Pharmacy Patient Advocate Encounter   Received notification from Patient Advice Request messages that prior authorization for entresto  is required/requested.   Insurance verification completed.   The patient is insured through [facility] ADVANTAGE/RX ADVANCE.   Per test claim: PA required; PA submitted to above mentioned insurance via Latent Key/confirmation #/EOC [id number] Status is pending

## 2024-08-26 ENCOUNTER — Other Ambulatory Visit (HOSPITAL_COMMUNITY): Payer: Self-pay

## 2024-08-26 ENCOUNTER — Telehealth: Payer: Self-pay | Admitting: Cardiology

## 2024-08-26 NOTE — Telephone Encounter (Signed)
 Pt c/o medication issue:  1. Name of Medication:   sacubitril -valsartan  (ENTRESTO ) 24-26 MG    2. How are you currently taking this medication (dosage and times per day)? As written   3. Are you having a reaction (difficulty breathing--STAT)? No   4. What is your medication issue?     Medication is no longer covered by her insurance, looking for cheaper option. Please advise.

## 2024-08-26 NOTE — Telephone Encounter (Signed)
 Spoke with pharmacy. Pt has deductible of $272. Explained to pt in depth, burt they state they do not have a deductible. Will forward to MD for advisement.

## 2024-08-27 NOTE — Telephone Encounter (Signed)
 Spoke with pt who is reporting her insurance agent told her neither brand or generic are covered under her insurance plan.  Pt is asking to be changes to a different medication.  Advised this has been sent to Dr Jeffrie for review and any new orders.  She is aware she will be contacted once this has been completed.

## 2024-08-28 ENCOUNTER — Telehealth: Payer: Self-pay | Admitting: Pharmacy Technician

## 2024-08-28 ENCOUNTER — Other Ambulatory Visit (HOSPITAL_COMMUNITY): Payer: Self-pay

## 2024-08-28 NOTE — Telephone Encounter (Signed)
 Patient Advocate Encounter   The patient was approved for a [employer] grant that will help cover the cost of Entresto  Total amount awarded, 7500.00.  Effective: 07/29/24 - 07/28/25   APW:[id number] ERW:[id number] Hmnle:[id number] PI:[id number]   [employer] ID: [id number]   Pharmacy provided with approval and processing information. Patient informed via mychart

## 2024-10-16 ENCOUNTER — Ambulatory Visit: Admitting: Pulmonary Disease

## 2024-10-24 ENCOUNTER — Ambulatory Visit

## 2024-12-05 ENCOUNTER — Ambulatory Visit: Admitting: Cardiology

## 2024-12-30 ENCOUNTER — Ambulatory Visit: Payer: Self-pay

## 2025-01-23 ENCOUNTER — Ambulatory Visit

## 2025-03-24 ENCOUNTER — Ambulatory Visit
